# Patient Record
Sex: Female | Born: 1935 | Race: White | Hispanic: No | Smoking: Former smoker
Health system: Southern US, Community
[De-identification: ages and names within clinical notes are randomized; demographics above are authoritative.]

## PROBLEM LIST (undated history)

## (undated) DIAGNOSIS — R0989 Other specified symptoms and signs involving the circulatory and respiratory systems: Secondary | ICD-10-CM

## (undated) DIAGNOSIS — I219 Acute myocardial infarction, unspecified: Secondary | ICD-10-CM

## (undated) DIAGNOSIS — E079 Disorder of thyroid, unspecified: Secondary | ICD-10-CM

## (undated) DIAGNOSIS — Z8719 Personal history of other diseases of the digestive system: Secondary | ICD-10-CM

## (undated) DIAGNOSIS — I2510 Atherosclerotic heart disease of native coronary artery without angina pectoris: Secondary | ICD-10-CM

## (undated) DIAGNOSIS — R9431 Abnormal electrocardiogram [ECG] [EKG]: Secondary | ICD-10-CM

## (undated) DIAGNOSIS — J449 Chronic obstructive pulmonary disease, unspecified: Secondary | ICD-10-CM

## (undated) DIAGNOSIS — W19XXXA Unspecified fall, initial encounter: Secondary | ICD-10-CM

## (undated) DIAGNOSIS — F411 Generalized anxiety disorder: Secondary | ICD-10-CM

## (undated) DIAGNOSIS — R413 Other amnesia: Secondary | ICD-10-CM

## (undated) DIAGNOSIS — M79604 Pain in right leg: Secondary | ICD-10-CM

## (undated) DIAGNOSIS — F419 Anxiety disorder, unspecified: Secondary | ICD-10-CM

## (undated) DIAGNOSIS — D649 Anemia, unspecified: Secondary | ICD-10-CM

## (undated) DIAGNOSIS — I6529 Occlusion and stenosis of unspecified carotid artery: Secondary | ICD-10-CM

## (undated) DIAGNOSIS — J42 Unspecified chronic bronchitis: Secondary | ICD-10-CM

## (undated) DIAGNOSIS — M79605 Pain in left leg: Secondary | ICD-10-CM

## (undated) DIAGNOSIS — A0472 Enterocolitis due to Clostridium difficile, not specified as recurrent: Secondary | ICD-10-CM

## (undated) DIAGNOSIS — I10 Essential (primary) hypertension: Secondary | ICD-10-CM

## (undated) DIAGNOSIS — M1990 Unspecified osteoarthritis, unspecified site: Secondary | ICD-10-CM

## (undated) HISTORY — DX: Chronic obstructive pulmonary disease, unspecified: J44.9

## (undated) HISTORY — DX: Occlusion and stenosis of unspecified carotid artery: I65.29

## (undated) HISTORY — DX: Essential (primary) hypertension: I10

## (undated) HISTORY — DX: Personal history of other diseases of the digestive system: Z87.19

## (undated) HISTORY — DX: Atherosclerotic heart disease of native coronary artery without angina pectoris: I25.10

## (undated) HISTORY — DX: Other specified symptoms and signs involving the circulatory and respiratory systems: R09.89

## (undated) HISTORY — DX: Unspecified osteoarthritis, unspecified site: M19.90

## (undated) HISTORY — DX: Anxiety disorder, unspecified: F41.9

## (undated) HISTORY — DX: Anemia, unspecified: D64.9

## (undated) HISTORY — DX: Unspecified fall, initial encounter: W19.XXXA

## (undated) HISTORY — DX: Pain in left leg: M79.604

## (undated) HISTORY — DX: Disorder of thyroid, unspecified: E07.9

## (undated) HISTORY — DX: Acute myocardial infarction, unspecified: I21.9

## (undated) HISTORY — DX: Pain in left leg: M79.605

## (undated) HISTORY — DX: Generalized anxiety disorder: F41.1

## (undated) HISTORY — DX: Other amnesia: R41.3

## (undated) HISTORY — DX: Abnormal electrocardiogram (ECG) (EKG): R94.31

## (undated) HISTORY — DX: Enterocolitis due to Clostridium difficile, not specified as recurrent: A04.72

## (undated) HISTORY — DX: Unspecified chronic bronchitis: J42

---

## 1985-04-09 DIAGNOSIS — I219 Acute myocardial infarction, unspecified: Secondary | ICD-10-CM

## 1985-04-09 HISTORY — DX: Acute myocardial infarction, unspecified: I21.9

## 1997-07-28 ENCOUNTER — Other Ambulatory Visit: Admission: RE | Admit: 1997-07-28 | Discharge: 1997-07-28 | Payer: Self-pay | Admitting: Obstetrics and Gynecology

## 1997-09-08 ENCOUNTER — Inpatient Hospital Stay (HOSPITAL_COMMUNITY): Admission: AD | Admit: 1997-09-08 | Discharge: 1997-09-12 | Payer: Self-pay | Admitting: *Deleted

## 1997-10-15 ENCOUNTER — Encounter: Payer: Self-pay | Admitting: Internal Medicine

## 1997-10-15 DIAGNOSIS — K5730 Diverticulosis of large intestine without perforation or abscess without bleeding: Secondary | ICD-10-CM | POA: Insufficient documentation

## 1997-10-15 DIAGNOSIS — D126 Benign neoplasm of colon, unspecified: Secondary | ICD-10-CM

## 1999-02-28 ENCOUNTER — Other Ambulatory Visit: Admission: RE | Admit: 1999-02-28 | Discharge: 1999-02-28 | Payer: Self-pay | Admitting: Obstetrics and Gynecology

## 2001-01-15 ENCOUNTER — Encounter: Admission: RE | Admit: 2001-01-15 | Discharge: 2001-01-15 | Payer: Self-pay | Admitting: Family Medicine

## 2001-01-15 ENCOUNTER — Encounter: Payer: Self-pay | Admitting: Family Medicine

## 2002-04-10 ENCOUNTER — Emergency Department (HOSPITAL_COMMUNITY): Admission: EM | Admit: 2002-04-10 | Discharge: 2002-04-11 | Payer: Self-pay | Admitting: Emergency Medicine

## 2002-04-10 ENCOUNTER — Encounter: Payer: Self-pay | Admitting: Emergency Medicine

## 2002-04-11 ENCOUNTER — Encounter: Payer: Self-pay | Admitting: Emergency Medicine

## 2002-05-07 ENCOUNTER — Encounter: Payer: Self-pay | Admitting: Internal Medicine

## 2002-05-07 ENCOUNTER — Encounter: Admission: RE | Admit: 2002-05-07 | Discharge: 2002-05-07 | Payer: Self-pay | Admitting: Internal Medicine

## 2002-06-16 ENCOUNTER — Encounter: Payer: Self-pay | Admitting: Internal Medicine

## 2002-06-16 ENCOUNTER — Ambulatory Visit (HOSPITAL_COMMUNITY): Admission: RE | Admit: 2002-06-16 | Discharge: 2002-06-16 | Payer: Self-pay | Admitting: Internal Medicine

## 2003-02-02 ENCOUNTER — Encounter: Admission: RE | Admit: 2003-02-02 | Discharge: 2003-02-02 | Payer: Self-pay | Admitting: Internal Medicine

## 2003-06-25 ENCOUNTER — Ambulatory Visit (HOSPITAL_COMMUNITY): Admission: RE | Admit: 2003-06-25 | Discharge: 2003-06-25 | Payer: Self-pay | Admitting: Internal Medicine

## 2003-07-08 ENCOUNTER — Encounter: Admission: RE | Admit: 2003-07-08 | Discharge: 2003-07-08 | Payer: Self-pay | Admitting: Internal Medicine

## 2003-07-08 ENCOUNTER — Inpatient Hospital Stay (HOSPITAL_COMMUNITY): Admission: EM | Admit: 2003-07-08 | Discharge: 2003-07-13 | Payer: Self-pay | Admitting: Emergency Medicine

## 2003-07-09 ENCOUNTER — Encounter (INDEPENDENT_AMBULATORY_CARE_PROVIDER_SITE_OTHER): Payer: Self-pay | Admitting: *Deleted

## 2003-07-15 ENCOUNTER — Ambulatory Visit (HOSPITAL_COMMUNITY): Admission: RE | Admit: 2003-07-15 | Discharge: 2003-07-15 | Payer: Self-pay | Admitting: General Surgery

## 2003-07-16 ENCOUNTER — Emergency Department (HOSPITAL_COMMUNITY): Admission: EM | Admit: 2003-07-16 | Discharge: 2003-07-17 | Payer: Self-pay | Admitting: Emergency Medicine

## 2003-07-28 ENCOUNTER — Ambulatory Visit (HOSPITAL_COMMUNITY): Admission: RE | Admit: 2003-07-28 | Discharge: 2003-07-28 | Payer: Self-pay | Admitting: General Surgery

## 2003-08-11 ENCOUNTER — Ambulatory Visit (HOSPITAL_COMMUNITY): Admission: RE | Admit: 2003-08-11 | Discharge: 2003-08-11 | Payer: Self-pay | Admitting: General Surgery

## 2005-04-17 ENCOUNTER — Emergency Department (HOSPITAL_COMMUNITY): Admission: EM | Admit: 2005-04-17 | Discharge: 2005-04-17 | Payer: Self-pay | Admitting: Emergency Medicine

## 2006-12-25 ENCOUNTER — Ambulatory Visit: Payer: Self-pay | Admitting: Gastroenterology

## 2007-06-25 DIAGNOSIS — I209 Angina pectoris, unspecified: Secondary | ICD-10-CM

## 2007-06-25 DIAGNOSIS — E039 Hypothyroidism, unspecified: Secondary | ICD-10-CM

## 2007-06-25 DIAGNOSIS — I2510 Atherosclerotic heart disease of native coronary artery without angina pectoris: Secondary | ICD-10-CM

## 2007-06-25 DIAGNOSIS — K921 Melena: Secondary | ICD-10-CM | POA: Insufficient documentation

## 2007-06-25 DIAGNOSIS — D649 Anemia, unspecified: Secondary | ICD-10-CM | POA: Insufficient documentation

## 2007-06-25 DIAGNOSIS — N10 Acute tubulo-interstitial nephritis: Secondary | ICD-10-CM

## 2007-06-25 DIAGNOSIS — E7800 Pure hypercholesterolemia, unspecified: Secondary | ICD-10-CM

## 2007-06-25 DIAGNOSIS — I219 Acute myocardial infarction, unspecified: Secondary | ICD-10-CM | POA: Insufficient documentation

## 2007-06-25 DIAGNOSIS — K648 Other hemorrhoids: Secondary | ICD-10-CM | POA: Insufficient documentation

## 2007-06-25 DIAGNOSIS — K644 Residual hemorrhoidal skin tags: Secondary | ICD-10-CM | POA: Insufficient documentation

## 2008-01-15 ENCOUNTER — Inpatient Hospital Stay (HOSPITAL_COMMUNITY): Admission: RE | Admit: 2008-01-15 | Discharge: 2008-01-17 | Payer: Self-pay | Admitting: Internal Medicine

## 2008-01-15 ENCOUNTER — Encounter: Payer: Self-pay | Admitting: Emergency Medicine

## 2008-10-12 ENCOUNTER — Encounter
Admission: RE | Admit: 2008-10-12 | Discharge: 2008-10-12 | Payer: Self-pay | Admitting: Physical Medicine and Rehabilitation

## 2008-11-01 ENCOUNTER — Ambulatory Visit: Payer: Self-pay | Admitting: Diagnostic Radiology

## 2008-11-01 ENCOUNTER — Emergency Department (HOSPITAL_BASED_OUTPATIENT_CLINIC_OR_DEPARTMENT_OTHER): Admission: EM | Admit: 2008-11-01 | Discharge: 2008-11-01 | Payer: Self-pay | Admitting: Emergency Medicine

## 2008-11-10 ENCOUNTER — Encounter: Payer: Self-pay | Admitting: Pulmonary Disease

## 2008-11-10 ENCOUNTER — Inpatient Hospital Stay (HOSPITAL_COMMUNITY): Admission: EM | Admit: 2008-11-10 | Discharge: 2008-11-11 | Payer: Self-pay | Admitting: Emergency Medicine

## 2008-11-16 ENCOUNTER — Encounter: Payer: Self-pay | Admitting: Pulmonary Disease

## 2008-11-29 ENCOUNTER — Ambulatory Visit: Payer: Self-pay | Admitting: Pulmonary Disease

## 2008-11-29 DIAGNOSIS — R930 Abnormal findings on diagnostic imaging of skull and head, not elsewhere classified: Secondary | ICD-10-CM

## 2008-12-22 ENCOUNTER — Ambulatory Visit: Payer: Self-pay | Admitting: Vascular Surgery

## 2008-12-30 ENCOUNTER — Ambulatory Visit: Payer: Self-pay | Admitting: Vascular Surgery

## 2008-12-30 ENCOUNTER — Inpatient Hospital Stay (HOSPITAL_COMMUNITY): Admission: RE | Admit: 2008-12-30 | Discharge: 2009-01-01 | Payer: Self-pay | Admitting: Vascular Surgery

## 2008-12-30 ENCOUNTER — Encounter: Payer: Self-pay | Admitting: Vascular Surgery

## 2008-12-30 HISTORY — PX: CAROTID ENDARTERECTOMY: SUR193

## 2009-01-12 ENCOUNTER — Encounter: Admission: RE | Admit: 2009-01-12 | Discharge: 2009-01-12 | Payer: Self-pay | Admitting: Vascular Surgery

## 2009-01-12 ENCOUNTER — Ambulatory Visit: Payer: Self-pay | Admitting: Vascular Surgery

## 2009-01-19 ENCOUNTER — Ambulatory Visit: Payer: Self-pay | Admitting: Vascular Surgery

## 2009-04-27 ENCOUNTER — Encounter: Payer: Self-pay | Admitting: Pulmonary Disease

## 2009-04-28 ENCOUNTER — Inpatient Hospital Stay (HOSPITAL_COMMUNITY): Admission: EM | Admit: 2009-04-28 | Discharge: 2009-05-01 | Payer: Self-pay | Admitting: Internal Medicine

## 2009-04-28 ENCOUNTER — Encounter: Payer: Self-pay | Admitting: Emergency Medicine

## 2009-04-28 ENCOUNTER — Ambulatory Visit: Payer: Self-pay | Admitting: Diagnostic Radiology

## 2009-04-29 ENCOUNTER — Encounter (INDEPENDENT_AMBULATORY_CARE_PROVIDER_SITE_OTHER): Payer: Self-pay | Admitting: Internal Medicine

## 2009-04-29 ENCOUNTER — Ambulatory Visit: Payer: Self-pay | Admitting: Gastroenterology

## 2009-07-20 ENCOUNTER — Ambulatory Visit: Payer: Self-pay | Admitting: Vascular Surgery

## 2009-09-14 ENCOUNTER — Ambulatory Visit: Payer: Self-pay | Admitting: Hematology & Oncology

## 2009-09-28 LAB — CBC WITH DIFFERENTIAL (CANCER CENTER ONLY)
BASO#: 0 10*3/uL (ref 0.0–0.2)
EOS%: 6.1 % (ref 0.0–7.0)
Eosinophils Absolute: 0.7 10*3/uL — ABNORMAL HIGH (ref 0.0–0.5)
HCT: 29.1 % — ABNORMAL LOW (ref 34.8–46.6)
HGB: 9.4 g/dL — ABNORMAL LOW (ref 11.6–15.9)
LYMPH#: 2 10*3/uL (ref 0.9–3.3)
MCHC: 32.2 g/dL (ref 32.0–36.0)
NEUT#: 7.3 10*3/uL — ABNORMAL HIGH (ref 1.5–6.5)
NEUT%: 69 % (ref 39.6–80.0)
RBC: 4.01 10*6/uL (ref 3.70–5.32)

## 2009-09-30 LAB — IRON AND TIBC
%SAT: 6 % — ABNORMAL LOW (ref 20–55)
Iron: 21 ug/dL — ABNORMAL LOW (ref 42–145)
UIBC: 336 ug/dL

## 2009-09-30 LAB — RETICULOCYTES (CHCC)
ABS Retic: 32.6 10*3/uL (ref 19.0–186.0)
RBC.: 4.07 MIL/uL (ref 3.87–5.11)
Retic Ct Pct: 0.8 % (ref 0.4–3.1)

## 2009-09-30 LAB — FERRITIN: Ferritin: 12 ng/mL (ref 10–291)

## 2009-10-12 ENCOUNTER — Ambulatory Visit: Payer: Self-pay | Admitting: Diagnostic Radiology

## 2009-10-12 ENCOUNTER — Emergency Department (HOSPITAL_BASED_OUTPATIENT_CLINIC_OR_DEPARTMENT_OTHER): Admission: EM | Admit: 2009-10-12 | Discharge: 2009-10-12 | Payer: Self-pay | Admitting: Emergency Medicine

## 2009-10-20 ENCOUNTER — Ambulatory Visit: Payer: Self-pay | Admitting: Hematology & Oncology

## 2009-10-31 ENCOUNTER — Ambulatory Visit: Payer: Self-pay | Admitting: Diagnostic Radiology

## 2009-10-31 ENCOUNTER — Emergency Department (HOSPITAL_BASED_OUTPATIENT_CLINIC_OR_DEPARTMENT_OTHER): Admission: EM | Admit: 2009-10-31 | Discharge: 2009-10-31 | Payer: Self-pay | Admitting: Emergency Medicine

## 2010-02-03 ENCOUNTER — Telehealth: Payer: Self-pay | Admitting: Pulmonary Disease

## 2010-02-16 ENCOUNTER — Encounter: Admission: RE | Admit: 2010-02-16 | Discharge: 2010-02-16 | Payer: Self-pay | Admitting: Pulmonary Disease

## 2010-02-17 ENCOUNTER — Telehealth: Payer: Self-pay | Admitting: Pulmonary Disease

## 2010-02-22 ENCOUNTER — Ambulatory Visit: Payer: Self-pay | Admitting: Pulmonary Disease

## 2010-02-22 DIAGNOSIS — R918 Other nonspecific abnormal finding of lung field: Secondary | ICD-10-CM

## 2010-02-24 ENCOUNTER — Ambulatory Visit: Payer: Self-pay | Admitting: Vascular Surgery

## 2010-04-30 ENCOUNTER — Encounter: Payer: Self-pay | Admitting: Pulmonary Disease

## 2010-05-01 ENCOUNTER — Encounter: Payer: Self-pay | Admitting: Gastroenterology

## 2010-05-09 NOTE — Progress Notes (Signed)
Summary: speak to nurse re: CT  Phone Note Call from Patient Call back at Home Phone [phone]   Caller: Patient Call For: clance Complaint: Breathing Problems Summary of Call: pt says dr Royce Macadamia had requested that she (pt) call dr. clance's office to schedule a CT.    Initial call taken by: Tivis Ringer, CNA,  February 03, 2010 2:27 PM  Follow-up for Phone Call        Pt has not been seen since 11/2008; made appt for 02/23/2010 to see [facility] and advise CT scan then. Will forward to [facility] in case CT should be ordered prior. Reynaldo Minium CMA  February 03, 2010 3:50 PM   Additional Follow-up for Phone Call Additional follow up Details #1::        I ordered a ct chest in Jan to folllowup her abnormality.  did she not have this done? I do not have results for it, and never saw it. if she did not have this ct done in jan, need to have one before ov with me noncontrast scan to f/u abnormality Additional Follow-up by: Barbaraann Share MD,  February 03, 2010 5:16 PM    Additional Follow-up for Phone Call Additional follow up Details #2::    Pt aware that order per [facility] has been sent to [facility] and they will call with date and time.Reynaldo Minium CMA  February 03, 2010 5:20 PM

## 2010-05-09 NOTE — Assessment & Plan Note (Signed)
Summary: rov for abnormal ct chest   Visit Type:  Follow-up Copy to:  Charlesetta Shanks Primary Provider/Referring Provider:  Charlesetta Shanks  CC:  1 year f/u. Pt states she is here to discuss her ct in furthur detail. Pt states her breathing has been doing "fine". pt denies any cough. .  History of Present Illness: the pt comes in today for f/u of her known abnormal ct chest.  She has a GGO in the RUL that we have been following, and the most recent ct chest showed no change.  The pt denies any symptoms at this time, and has been eating well without unexplained wt loss.  She denies any cough, hemoptysis or chest pain.  Current Medications (verified): 1)  Isosorbide Mononitrate Cr 30 Mg Xr24h-Tab (Isosorbide Mononitrate) .... Take 1 Tablet By Mouth Once A Day 2)  Levothyroxine Sodium 88 Mcg Tabs (Levothyroxine Sodium) .... Take 1 Tablet By Mouth Once A Day 3)  Alprazolam 0.5 Mg Tabs (Alprazolam) .... Take By Mouth As Needed 4)  Duoneb 0.5-2.5 (3) Mg/6ml Soln (Ipratropium-Albuterol) .Marland Kitchen.. 1 Vial in Nebulizer Two Times A Day 5)  Citalopram Hydrobromide 20 Mg Tabs (Citalopram Hydrobromide) .... Take 1 Tablet By Mouth Once A Day 6)  Nitroglycerin 0.4 Mg Subl (Nitroglycerin) .... Use As Needed 7)  Atrovent Hfa 17 Mcg/act Aers (Ipratropium Bromide Hfa) .... 2 Puffs Four Times A Day X 30 Days 8)  Spiriva Handihaler 18 Mcg Caps (Tiotropium Bromide Monohydrate) .... Once Daily 9)  Oxycodone Hcl 10 Mg Tabs (Oxycodone Hcl) .Marland Kitchen.. 1 Two Times A Day 10)  Oxygen 3 Liters .... All Time  Allergies (verified): 1)  ! Penicillin 2)  ! Sulfa 3)  ! Aspirin  Review of Systems  The patient denies shortness of breath with activity, shortness of breath at rest, productive cough, non-productive cough, coughing up blood, chest pain, irregular heartbeats, acid heartburn, indigestion, loss of appetite, weight change, abdominal pain, difficulty swallowing, sore throat, tooth/dental problems, headaches, nasal  congestion/difficulty breathing through nose, sneezing, itching, ear ache, anxiety, depression, hand/feet swelling, joint stiffness or pain, rash, change in color of mucus, and fever.    Vital Signs:  Patient profile:   77 year old female Height:      64 inches Weight:      129 pounds BMI:     22.22 O2 Sat:      94 % on Room air Temp:     98.2 degrees F oral Pulse rate:   86 / minute BP sitting:   124 / 82  (left arm) Cuff size:   regular  Vitals Entered By: Carver Fila (February 22, 2010 2:17 PM)  O2 Flow:  Room air CC: 1 year f/u. Pt states she is here to discuss her ct in furthur detail. Pt states her breathing has been doing "fine". pt denies any cough.  Comments meds and allergies updated Phone number updated Carver Fila  February 22, 2010 2:18 PM    Physical Exam  General:  wd female in nad Lungs:  clear  Heart:  rrr Extremities:  no edema or cyanosis  Neurologic:  alert, oriented, moves all 4.   Impression & Recommendations:  Problem # 1:  PULMONARY NODULE (ICD-518.89)  the pt has a RUL GGO that we have been following, and has not changed since 2009.  It did appear slightly larger in 2010 compared to 2005.  I have discussed with her the difference btw "solid" spots and "fuzzy spots", and the need to follow GGO  more closely for a longer period of time.  Will check one more ct in a year, and if ok, no further.  Medications Added to Medication List This Visit: 1)  Atrovent Hfa 17 Mcg/act Aers (Ipratropium bromide hfa) .... 2 puffs four times a day x 30 days 2)  Spiriva Handihaler 18 Mcg Caps (Tiotropium bromide monohydrate) .... Once daily 3)  Oxycodone Hcl 10 Mg Tabs (Oxycodone hcl) .Marland Kitchen.. 1 two times a day 4)  Oxygen 3 Liters  .... All time  Other Orders: Est. Patient Level III ([address])  Patient Instructions: 1)  will do one more followup ct scan in one year as we discussed to look at your "fuzzy" lung spot.  Will call you with results.   Immunization  History:  Influenza Immunization History:    Influenza:  historical (01/07/2010)

## 2010-05-09 NOTE — Progress Notes (Signed)
Summary: results  Phone Note Call from Patient   Caller: Patient Call For: clance Summary of Call: calling about ct scan results Initial call taken by: Rickard Patience,  February 17, 2010 2:26 PM  Follow-up for Phone Call        looked like mindy called pt and pt was given results of her ct scan---pt stated that this has been bothering her all day since Dr. Royce Macadamia told pt that she has emphysema but the results she was given today said nothing about that .Marland Kitchen..please advise. pt is just wanting to know if she has emphysema. Randell Loop [facility]  February 17, 2010 3:01 PM   Additional Follow-up for Phone Call Additional follow up Details #1::        emphysema is not diagnosed by cxr or ct scan, it is diagnosed by breathing studies.  Her studies in the past have shown some emphysema. Additional Follow-up by: Barbaraann Share MD,  February 17, 2010 5:18 PM    Additional Follow-up for Phone Call Additional follow up Details #2::    called and spoke with pt and she is aware per [facility] that the cxr and ct scan do not diagnose with emphysema that it is done by the breathing tests.  she is aware that she has appt on 11-16 with [facility] and will discuss this further at that time. Randell Loop CMA  February 17, 2010 5:23 PM

## 2010-05-09 NOTE — Procedures (Signed)
Summary: Upper Endoscopy  Patient: [name] Note: All result statuses are Final unless otherwise noted.  Tests: (1) Upper Endoscopy (EGD)   EGD Upper Endoscopy       DONE     [facility]     [address]     [place], [place]  [address]           ENDOSCOPY PROCEDURE REPORT           PATIENT:  [name], [name]  MR#:  [id number]     BIRTHDATE:  February 15, 1931, 78 yrs. old  GENDER:  female           ENDOSCOPIST:  Barbette Hair. [name], MD     Referred by:           PROCEDURE DATE:  04/29/2009     PROCEDURE:  EGD with biopsy, EGD for control of bleeding     ASA CLASS:  Class II     INDICATIONS:  hematemesis           MEDICATIONS:   Fentanyl 50 mcg IV, Versed 4 mg IV, glycopyrrolate     (Robinal) 0.2 mg IV     TOPICAL ANESTHETIC:  Cetacaine Spray           DESCRIPTION OF PROCEDURE:   After the risks benefits and     alternatives of the procedure were thoroughly explained, informed     consent was obtained.  The EG-[id number] ([id number]) endoscope was     introduced through the mouth and advanced to the third portion of     the duodenum, without limitations.  The instrument was slowly     withdrawn as the mucosa was fully examined.     <<PROCEDUREIMAGES>>           An ulcer was found in the bulb of the duodenum. 1.5cm ulcer with     oozing (see image004). Ulcer was successfully cauterized with the     bipolar probe  An ulcer was found in the bulb of the duodenum. 5mm     ulcer at apex of bulb (see image005). Single red spot at base  An     ulcer was found in the antrum. 5mm ulcer with surrounding     erythema/edema. Bx taken (see image002).  Otherwise the     examination was normal.    Retroflexed views revealed no     abnormalities.    The scope was then withdrawn from the patient     and the procedure completed.           COMPLICATIONS:  None           ENDOSCOPIC IMPRESSION:     1) Bleeding Ulcer in the bulb of duodenum - s/p successful     cauterization with  bicap probe     2) Ulcer in the bulb of duodenum     3) Ulcer in the antrum     4) Otherwise normal examination     RECOMMENDATIONS:Protonix bid     Hold ASA/NSAIDS           REPEAT EXAM:  No           ______________________________     Barbette Hair. [name], MD           CC:  Charlesetta Shanks, Md           n.     eSIGNED:   Barbette Hair. Jaymin Waln at 04/29/2009 02:50 PM  [place], [place], [phone]  Note: An exclamation mark (!) indicates a result that was not dispersed into the flowsheet. Document Creation Date: 04/29/2009 2:51 PM _______________________________________________________________________  (1) Order result status: Final Collection or observation date-time: 04/29/2009 14:35 Requested date-time:  Receipt date-time:  Reported date-time:  Referring Physician:   Ordering Physician: Melvia Heaps [phone]) Specimen Source:  Source: Launa Grill Order Number: [id number] Lab site:

## 2010-05-09 NOTE — Miscellaneous (Signed)
Summary: Orders Update   Clinical Lists Changes  Orders: Added new Referral order of Radiology Referral (Radiology) - Signed 

## 2010-06-25 LAB — CBC
HCT: 25.7 % — ABNORMAL LOW (ref 36.0–46.0)
Hemoglobin: 7.2 g/dL — ABNORMAL LOW (ref 12.0–15.0)
Hemoglobin: 8.7 g/dL — ABNORMAL LOW (ref 12.0–15.0)
MCHC: 32.3 g/dL (ref 30.0–36.0)
MCHC: 36 g/dL (ref 30.0–36.0)
MCV: 90.6 fL (ref 78.0–100.0)
Platelets: 393 10*3/uL (ref 150–400)
Platelets: 160 10*3/uL (ref 150–400)
Platelets: 177 10*3/uL (ref 150–400)
Platelets: 284 10*3/uL (ref 150–400)
RBC: 2.17 MIL/uL — ABNORMAL LOW (ref 3.87–5.11)
RDW: 26.6 % — ABNORMAL HIGH (ref 11.5–15.5)
RDW: 13.4 % (ref 11.5–15.5)
RDW: 14.4 % (ref 11.5–15.5)
RDW: 14.9 % (ref 11.5–15.5)
RDW: 12.9 % (ref 11.5–15.5)
WBC: 5.6 10*3/uL (ref 4.0–10.5)
WBC: 10.6 10*3/uL — ABNORMAL HIGH (ref 4.0–10.5)
WBC: 14.3 10*3/uL — ABNORMAL HIGH (ref 4.0–10.5)
WBC: 7.2 10*3/uL (ref 4.0–10.5)
WBC: 9.6 10*3/uL (ref 4.0–10.5)

## 2010-06-25 LAB — POCT I-STAT 3, ART BLOOD GAS (G3+)
Bicarbonate: 25.3 mEq/L — ABNORMAL HIGH (ref 20.0–24.0)
O2 Saturation: 89 %
pCO2 arterial: 41.1 mmHg (ref 35.0–45.0)
pO2, Arterial: 56 mmHg — ABNORMAL LOW (ref 80.0–100.0)

## 2010-06-25 LAB — VITAMIN B12: Vitamin B-12: 234 pg/mL (ref 211–911)

## 2010-06-25 LAB — TYPE AND SCREEN
ABO/RH(D): O POS
Antibody Screen: POSITIVE
DAT, IgG: NEGATIVE
Donor AG Type: NEGATIVE
PT AG Type: NEGATIVE

## 2010-06-25 LAB — TRANSFERRIN: Transferrin: 233 mg/dL (ref 212–360)

## 2010-06-25 LAB — COMPREHENSIVE METABOLIC PANEL
ALT: 10 U/L (ref 0–35)
Albumin: 2.8 g/dL — ABNORMAL LOW (ref 3.5–5.2)
Alkaline Phosphatase: 59 U/L (ref 39–117)
Glucose, Bld: 130 mg/dL — ABNORMAL HIGH (ref 70–99)
Potassium: 4.1 mEq/L (ref 3.5–5.1)
Sodium: 141 mEq/L (ref 135–145)
Total Protein: 4.7 g/dL — ABNORMAL LOW (ref 6.0–8.3)

## 2010-06-25 LAB — DIFFERENTIAL
Basophils Absolute: 0 10*3/uL (ref 0.0–0.1)
Basophils Absolute: 0.1 10*3/uL (ref 0.0–0.1)
Eosinophils Absolute: 0.4 10*3/uL (ref 0.0–0.7)
Lymphocytes Relative: 17 % (ref 12–46)
Lymphocytes Relative: 30 % (ref 12–46)
Lymphs Abs: 2.9 10*3/uL (ref 0.7–4.0)
Monocytes Relative: 5 % (ref 3–12)
Neutro Abs: 3.9 10*3/uL (ref 1.7–7.7)
Neutro Abs: 5.9 10*3/uL (ref 1.7–7.7)
Neutrophils Relative %: 70 % (ref 43–77)

## 2010-06-25 LAB — BASIC METABOLIC PANEL
BUN: 14 mg/dL (ref 6–23)
BUN: 17 mg/dL (ref 6–23)
BUN: 57 mg/dL — ABNORMAL HIGH (ref 6–23)
Calcium: 8.9 mg/dL (ref 8.4–10.5)
Calcium: 8.5 mg/dL (ref 8.4–10.5)
Calcium: 9.4 mg/dL (ref 8.4–10.5)
Chloride: 114 mEq/L — ABNORMAL HIGH (ref 96–112)
Creatinine, Ser: 0.8 mg/dL (ref 0.4–1.2)
Creatinine, Ser: 0.59 mg/dL (ref 0.4–1.2)
GFR calc non Af Amer: >60 mL/min (ref >60)
GFR calc non Af Amer: >60 mL/min (ref >60)
GFR calc non Af Amer: >60 mL/min (ref >60)
Glucose, Bld: 130 mg/dL — ABNORMAL HIGH (ref 70–99)
Glucose, Bld: 101 mg/dL — ABNORMAL HIGH (ref 70–99)
Glucose, Bld: 107 mg/dL — ABNORMAL HIGH (ref 70–99)
Glucose, Bld: 186 mg/dL — ABNORMAL HIGH (ref 70–99)
Potassium: 3.9 mEq/L (ref 3.5–5.1)
Potassium: 3.1 mEq/L — ABNORMAL LOW (ref 3.5–5.1)
Potassium: 3.4 mEq/L — ABNORMAL LOW (ref 3.5–5.1)
Sodium: 144 mEq/L (ref 135–145)
Sodium: 144 mEq/L (ref 135–145)

## 2010-06-25 LAB — URINALYSIS, ROUTINE W REFLEX MICROSCOPIC
Bilirubin Urine: NEGATIVE
Glucose, UA: NEGATIVE mg/dL
Ketones, ur: 15 mg/dL — AB
Protein, ur: NEGATIVE mg/dL

## 2010-06-25 LAB — HEMOGLOBIN A1C: Hgb A1c MFr Bld: 5.2 % (ref 4.6–6.1)

## 2010-06-25 LAB — IRON AND TIBC
Iron: 21 ug/dL — ABNORMAL LOW (ref 42–135)
Iron: 242 ug/dL — ABNORMAL HIGH (ref 42–135)
Saturation Ratios: 8 % — ABNORMAL LOW (ref 20–55)
TIBC: 268 ug/dL (ref 250–470)
UIBC: <55 ug/dL

## 2010-06-25 LAB — CARDIAC PANEL(CRET KIN+CKTOT+MB+TROPI)
CK, MB: 1.1 ng/mL (ref 0.3–4.0)
CK, MB: 1.2 ng/mL (ref 0.3–4.0)
Total CK: 17 U/L (ref 7–177)
Total CK: 23 U/L (ref 7–177)
Troponin I: 0.03 ng/mL (ref 0.00–0.06)

## 2010-06-25 LAB — GLUCOSE, CAPILLARY
Glucose-Capillary: 101 mg/dL — ABNORMAL HIGH (ref 70–99)
Glucose-Capillary: 101 mg/dL — ABNORMAL HIGH (ref 70–99)
Glucose-Capillary: 104 mg/dL — ABNORMAL HIGH (ref 70–99)
Glucose-Capillary: 108 mg/dL — ABNORMAL HIGH (ref 70–99)
Glucose-Capillary: 90 mg/dL (ref 70–99)
Glucose-Capillary: 92 mg/dL (ref 70–99)

## 2010-06-25 LAB — POCT CARDIAC MARKERS
CKMB, poc: 1 ng/mL (ref 1.0–8.0)
Troponin i, poc: <0.05 ng/mL (ref 0.00–0.09)

## 2010-06-25 LAB — HEMOGLOBIN AND HEMATOCRIT, BLOOD
HCT: 19.1 % — ABNORMAL LOW (ref 36.0–46.0)
HCT: 25.8 % — ABNORMAL LOW (ref 36.0–46.0)
HCT: 26.2 % — ABNORMAL LOW (ref 36.0–46.0)
HCT: 26.5 % — ABNORMAL LOW (ref 36.0–46.0)
HCT: 27 % — ABNORMAL LOW (ref 36.0–46.0)
HCT: 27.2 % — ABNORMAL LOW (ref 36.0–46.0)
Hemoglobin: 9.2 g/dL — ABNORMAL LOW (ref 12.0–15.0)
Hemoglobin: 9.4 g/dL — ABNORMAL LOW (ref 12.0–15.0)
Hemoglobin: 9.5 g/dL — ABNORMAL LOW (ref 12.0–15.0)

## 2010-06-25 LAB — URINE MICROSCOPIC-ADD ON

## 2010-06-25 LAB — PROTIME-INR: Prothrombin Time: 14.9 seconds (ref 11.6–15.2)

## 2010-06-25 LAB — MAGNESIUM: Magnesium: 2.2 mg/dL (ref 1.5–2.5)

## 2010-06-25 LAB — CULTURE, BLOOD (ROUTINE X 2): Culture: NO GROWTH

## 2010-06-25 LAB — HEMOCCULT GUIAC POC 1CARD (OFFICE): Fecal Occult Bld: NEGATIVE

## 2010-07-14 LAB — COMPREHENSIVE METABOLIC PANEL
ALT: 13 U/L (ref 0–35)
Alkaline Phosphatase: 56 U/L (ref 39–117)
CO2: 27 mEq/L (ref 19–32)
Chloride: 102 mEq/L (ref 96–112)
Glucose, Bld: 107 mg/dL — ABNORMAL HIGH (ref 70–99)
Potassium: 4.5 mEq/L (ref 3.5–5.1)
Sodium: 137 mEq/L (ref 135–145)
Total Bilirubin: 0.5 mg/dL (ref 0.3–1.2)
Total Protein: 6.6 g/dL (ref 6.0–8.3)

## 2010-07-14 LAB — BASIC METABOLIC PANEL
Chloride: 108 mEq/L (ref 96–112)
GFR calc Af Amer: >60 mL/min (ref >60)
GFR calc non Af Amer: >60 mL/min (ref >60)
Potassium: 3.7 mEq/L (ref 3.5–5.1)
Sodium: 141 mEq/L (ref 135–145)

## 2010-07-14 LAB — URINALYSIS, ROUTINE W REFLEX MICROSCOPIC
Bilirubin Urine: NEGATIVE
Glucose, UA: NEGATIVE mg/dL
Hgb urine dipstick: NEGATIVE
Ketones, ur: NEGATIVE mg/dL
Protein, ur: NEGATIVE mg/dL
pH: 6 (ref 5.0–8.0)

## 2010-07-14 LAB — TYPE AND SCREEN

## 2010-07-14 LAB — PROTIME-INR
INR: 1 (ref 0.00–1.49)
Prothrombin Time: 12.7 seconds (ref 11.6–15.2)

## 2010-07-14 LAB — CBC
HCT: 24.9 % — ABNORMAL LOW (ref 36.0–46.0)
HCT: 33.8 % — ABNORMAL LOW (ref 36.0–46.0)
Hemoglobin: 11.4 g/dL — ABNORMAL LOW (ref 12.0–15.0)
Hemoglobin: 9.7 g/dL — ABNORMAL LOW (ref 12.0–15.0)
MCHC: 33.7 g/dL (ref 30.0–36.0)
MCV: 84.1 fL (ref 78.0–100.0)
Platelets: 299 10*3/uL (ref 150–400)
RBC: 2.96 MIL/uL — ABNORMAL LOW (ref 3.87–5.11)
RBC: 3.52 MIL/uL — ABNORMAL LOW (ref 3.87–5.11)
RBC: 3.98 MIL/uL (ref 3.87–5.11)
RDW: 15.5 % (ref 11.5–15.5)
WBC: 8.4 10*3/uL (ref 4.0–10.5)
WBC: 9.1 10*3/uL (ref 4.0–10.5)

## 2010-07-14 LAB — URINE MICROSCOPIC-ADD ON

## 2010-07-14 LAB — APTT: aPTT: 38 seconds — ABNORMAL HIGH (ref 24–37)

## 2010-07-15 LAB — DIFFERENTIAL
Basophils Absolute: 0 10*3/uL (ref 0.0–0.1)
Basophils Relative: 0 % (ref 0–1)
Eosinophils Absolute: 0 10*3/uL (ref 0.0–0.7)
Eosinophils Relative: 0 % (ref 0–5)
Eosinophils Relative: 3 % (ref 0–5)
Lymphocytes Relative: 23 % (ref 12–46)
Monocytes Absolute: 0.4 10*3/uL (ref 0.1–1.0)
Monocytes Absolute: 0.5 10*3/uL (ref 0.1–1.0)
Monocytes Relative: 4 % (ref 3–12)
Monocytes Relative: 5 % (ref 3–12)
Neutro Abs: 6.8 10*3/uL (ref 1.7–7.7)
Neutro Abs: 8 10*3/uL — ABNORMAL HIGH (ref 1.7–7.7)

## 2010-07-15 LAB — URINALYSIS, ROUTINE W REFLEX MICROSCOPIC
Glucose, UA: NEGATIVE mg/dL
Hgb urine dipstick: NEGATIVE
Ketones, ur: NEGATIVE mg/dL
Protein, ur: NEGATIVE mg/dL
pH: 7.5 (ref 5.0–8.0)

## 2010-07-15 LAB — COMPREHENSIVE METABOLIC PANEL
ALT: 12 U/L (ref 0–35)
AST: 20 U/L (ref 0–37)
Albumin: 3 g/dL — ABNORMAL LOW (ref 3.5–5.2)
Alkaline Phosphatase: 53 U/L (ref 39–117)
BUN: 10 mg/dL (ref 6–23)
Chloride: 107 mEq/L (ref 96–112)
GFR calc Af Amer: >60 mL/min (ref >60)
Potassium: 3.7 mEq/L (ref 3.5–5.1)
Sodium: 140 mEq/L (ref 135–145)
Total Bilirubin: 0.8 mg/dL (ref 0.3–1.2)
Total Protein: 5.6 g/dL — ABNORMAL LOW (ref 6.0–8.3)

## 2010-07-15 LAB — CBC
HCT: 22.6 % — ABNORMAL LOW (ref 36.0–46.0)
HCT: 24.9 % — ABNORMAL LOW (ref 36.0–46.0)
Hemoglobin: 7.6 g/dL — CL (ref 12.0–15.0)
Platelets: 354 10*3/uL (ref 150–400)
RBC: 2.51 MIL/uL — ABNORMAL LOW (ref 3.87–5.11)
RDW: 15.6 % — ABNORMAL HIGH (ref 11.5–15.5)
WBC: 10 10*3/uL (ref 4.0–10.5)

## 2010-07-15 LAB — POCT I-STAT, CHEM 8
Calcium, Ion: 1.11 mmol/L — ABNORMAL LOW (ref 1.12–1.32)
Creatinine, Ser: 0.6 mg/dL (ref 0.4–1.2)
Glucose, Bld: 105 mg/dL — ABNORMAL HIGH (ref 70–99)
Hemoglobin: 7.8 g/dL — CL (ref 12.0–15.0)
TCO2: 25 mmol/L (ref 0–100)

## 2010-07-15 LAB — CULTURE, BLOOD (ROUTINE X 2): Culture: NO GROWTH

## 2010-07-15 LAB — CROSSMATCH

## 2010-07-15 LAB — IRON AND TIBC
Saturation Ratios: 3 % — ABNORMAL LOW (ref 20–55)
TIBC: 368 ug/dL (ref 250–470)
UIBC: 372 ug/dL

## 2010-07-15 LAB — VITAMIN B12
Vitamin B-12: 695 pg/mL (ref 211–911)
Vitamin B-12: 894 pg/mL (ref 211–911)

## 2010-07-15 LAB — RETICULOCYTES
RBC.: 2.49 MIL/uL — ABNORMAL LOW (ref 3.87–5.11)
Retic Ct Pct: 2.9 % (ref 0.4–3.1)

## 2010-07-15 LAB — POCT CARDIAC MARKERS
CKMB, poc: 1.6 ng/mL (ref 1.0–8.0)
Troponin i, poc: <0.05 ng/mL (ref 0.00–0.09)

## 2010-07-15 LAB — FOLATE
Folate: 14.9 ng/mL
Folate: 19.4 ng/mL

## 2010-07-15 LAB — FERRITIN: Ferritin: 12 ng/mL (ref 10–291)

## 2010-07-15 LAB — ABO/RH: ABO/RH(D): O POS

## 2010-07-15 LAB — PROTIME-INR: Prothrombin Time: 13.9 seconds (ref 11.6–15.2)

## 2010-08-22 NOTE — H&P (Signed)
[name], [name]              ACCOUNT NO.:  [id number]   MEDICAL RECORD NO.:  [id number]          PATIENT TYPE:  INP   LOCATION:  1408                         FACILITY:  [facility]   PHYSICIAN:  Peggye Pitt, M.D. DATE OF BIRTH:  27-Feb-1931   DATE OF ADMISSION:  01/15/2008  DATE OF DISCHARGE:                              HISTORY & PHYSICAL   PRIMARY CARE PHYSICIAN:  Dr. Charlesetta Shanks.   CHIEF COMPLAINT:  Syncope.   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  EMS was called who took her to the new  medical center in [facility].  In the ED her blood pressure was noted to  be 80/60.  She was promptly given IV fluids to which her blood pressure  responded well.  Her urinalysis was dirty and we are called to admit  her.   ALLERGIES:  1. She has stated allergies to PENICILLIN.  2. SULFA DRUGS.   PAST MEDICAL HISTORY:  1. Significant for hypothyroidism.  2. Hypertension.  3. Possible prior history of MI in 1984.   HOME MEDICATIONS:  1. Isosorbide mononitrate 30 mg p.o. daily.  2. Synthroid 88 mcg once daily.  3. Multivitamins once daily.  4. Dicyclomine 10 mg one cap t.i.d.  5. Felodipine ER 2.5 mg p.o. daily.  6. Hydrochlorothiazide 25 mg p.o. daily.   SOCIAL HISTORY:  She denies any illicit drug use.  She is an occasional  social drinker, one or two beers per week.  She does smoke a pack of  cigarettes lasting about one week.  She lives by herself.   FAMILY HISTORY:  Noncontributory in this elderly lady.   REVIEW OF SYSTEMS:  Negative except as per HPI.   PHYSICAL EXAMINATION:  VITAL SIGNS:  Upon admission to [facility]:  Blood pressure 151/73, heart rate 83, respirations 20, O2 saturations  92% on room air with temperature 97.7.  GENERAL:  She is alert, oriented x3, no acute distress.  HEENT:  Normocephalic, atraumatic.  Her pupils  are reactive to light and  accommodation with intact extraocular movements.  NECK:  Supple with no JVD, no lymphadenopathy, no bruits or goiter.  LUNGS:  Clear to auscultation bilaterally.  HEART:  Regular rate and rhythm with no murmurs, rubs, or gallops  auscultated.  ABDOMEN:  Soft, nontender, nondistended, positive bowel sounds.  EXTREMITIES:  No edema.  Positive pedal pulses.  NEUROLOGIC:  Exam is grossly intact and nonfocal.   LABORATORY DATA:  Upon admission, sodium 142, potassium 3.8, chloride  102, bicarb 30, BUN 15, creatinine 0.8, glucose of 126.  All of her LFTs  are within normal limits.  Her lipase is 87.  BNP 64 and INR of 1.  Urinalysis shows a large amount of leukocyte esterase with 21-50 wbc's  and few bacteria.   She had a chest x-ray that showed COPD with no acute changes.  A CT of  her head showed no acute findings with atrophy and small vessel disease.  She also had a CT angiogram of the chest that showed no acute findings  with atrophy and small vessel disease.  Next she also had a CT angiogram  of the chest that showed no PE, no acute findings, a small left lower  lobe nodule.  Recommend follow-up CT in 6-12 months.   ASSESSMENT/PLAN:  1. Pyelonephritis/urosepsis.  Will check urine and blood cultures.      Will place on IV Cipro b.i.d. pending blood cultures given her      penicillin and sulfa allergies.  Will give IV fluids consisting of      normal saline at 75 ml an hour.  Consider transition to p.o.      antibiotics and prompt discharge home if she remains afebrile with      no white count.  2. Syncope likely secondary to #1.  For this reason we will not do a      usual syncopal workup at this time.  3. Hypertension.  Now with better blood pressure.  Has been normalized      with IV fluids and she is actually a bit hypertensive.  Will      restart all of her home medications with close follow-up of her      blood pressure.  4. As for her hypothyroidism  will check a TSH and continue on her home      dose of Synthroid.  5. For prophylaxis while in the hospital the patient will have      Protonix for GI prophylaxis and on subcutaneous Lovenox for DVT      prophylaxis.      Peggye Pitt, M.D.  Electronically Signed     EH/MEDQ  D:  01/15/2008  T:  01/16/2008  Job:  [id number]   cc:   Charlesetta Shanks

## 2010-08-22 NOTE — Letter (Signed)
December 25, 2006    [name]   RE:  [name], [name]  MRN:  [id number]  /  DOB:  09-Oct-1930   DearMs. Marney:   It is my pleasure to have treated you recently as a new patient in my  office.  I appreciate your confidence and the opportunity to participate  in your care.   Since I do have a busy inpatient endoscopy schedule and office schedule,  my office hours vary weekly.  I am, however, available for emergency  calls every day through my office.  If I cannot promptly meet an urgent  office appointment, another one of our gastroenterologists will be able  to assist you.   My well-trained staff are prepared to help you at all times.  For  emergencies after office hours, a physician from our gastroenterology  section is always available through my 24-hour answering service.   While you are under my care, I encourage discussion of your questions  and concerns, and I will be happy to return your calls as soon as I am  available.   Once again, I welcome you as a new patient and I look forward to a happy  and healthy relationship.    Sincerely,      Barbette Hair. [name], MD,FACG  Electronically Signed   RDK/MedQ  DD: 12/25/2006  DT: 12/25/2006  Job #: [id number]

## 2010-08-22 NOTE — Procedures (Signed)
CAROTID DUPLEX EXAM   INDICATION:  Follow up carotid artery disease.   HISTORY:  Diabetes:  No  Cardiac:  No  Hypertension:  No  Smoking:  Quit  Previous Surgery:  12/30/08 Right CEA  CV History:  No  Amaurosis Fugax  No, Paresthesias  No, Hemiparesis  No.                                       RIGHT             LEFT  Brachial systolic pressure:         165               170  Brachial Doppler waveforms:         WNL               WNL  Vertebral direction of flow:        Antegrade         Antegrade  DUPLEX VELOCITIES (cm/sec)  CCA peak systolic                   59                71  ECA peak systolic                   318               117  ICA peak systolic                   115               100  ICA end diastolic                   42                41  PLAQUE MORPHOLOGY:                  Heterogenous      Heterogenous  PLAQUE AMOUNT:                      Mild              Mild  PLAQUE LOCATION:                    ECA               ECA   IMPRESSION:  1. Bilateral internal carotid arteries suggest 20% to 39% stenosis.  2. Antegrade flow in bilateral vertebrals.  3. Right ECA suggests stenosis.          ___________________________________________  Janetta Hora Fields, MD   CB/MEDQ  D:  07/20/2009  T:  07/20/2009  Job:  [id number]

## 2010-08-22 NOTE — Discharge Summary (Signed)
[name], [name]              ACCOUNT NO.:  [id number]   MEDICAL RECORD NO.:  [id number]          PATIENT TYPE:  INP   LOCATION:  1408                         FACILITY:  [facility]   PHYSICIAN:  Hillery Aldo, M.D.   DATE OF BIRTH:  1931-01-03   DATE OF ADMISSION:  01/15/2008  DATE OF DISCHARGE:  01/17/2008                               DISCHARGE SUMMARY   PRIMARY CARE PHYSICIAN:  Dr. Charlesetta [name].   DISCHARGE DIAGNOSES:  1. Syncopal event.  2. Hypotension.  3. Pyuria/bacteriuria with cultures negative.  4. History of coronary artery disease.  5. History of hypertension.  6. Hypothyroidism.  7. Left lower lobe pulmonary nodule.  Followup CT scan in 6-12 months      to ensure stability recommended.  8. Hypokalemia.  9. Chronic obstructive pulmonary disease.   DISCHARGE MEDICATIONS:  1. Isosorbide mononitrate 30 mg daily.  2. Levoxyl 88 mEq daily.  3. Aspirin Back and Body 500 mg daily.  4. Multivitamin daily.  5. Dicyclomine 10 mg t.i.d.  6. Felodipine ER 2.5 mg daily.  7. Hydrochlorothiazide 25 mg daily.  8. Trazodone 50 mg nightly.  9. Citalopram 20 mg daily.  10.Alprazolam 0.5 mg daily p.r.n.  11.Potassium chloride 20 mEq daily.   CONSULTATION:  None.   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  Her blood pressure  was noted to be 80/60 on transport to the emergency department.  She was  promptly given IV fluids.  Her urinalysis revealed evidence of bacteria,  pyuria and it was felt that she was septic, and therefore was admitted  for further evaluation and workup.  For the full details, please see the  dictated report done by Dr. Ardyth Harps.   PROCEDURES AND DIAGNOSTIC STUDIES:  1. Chest x-ray on January 15, 2008 showed changes of COPD with no      interval change.  2. CT scan of the head on January 15, 2008 showed  atrophy and small      vessel disease with no acute intracranial abnormality.  3. CT angiogram of the chest on January 15, 2008 showed no CT findings      to suggest pulmonary emboli.  There was atherosclerotic changes      involving the thoracic or the aorta, but no dissection or aneurysm.      No acute pulmonary findings.  COPD changes.  Vague ground glass      opacity in the left apex, slightly increased in size since 2005.      Small nodule on the left lower lobe, followup noncontrast chest CT      in 6-12 months recommended.   DISCHARGE LABORATORY VALUES:  Urine cultures were negative.  Sodium was  142, potassium 3.3 (repleted), chloride 107, bicarb 27, BUN 7,  creatinine 0.67, glucose 111, white blood cell count was 7.4, hemoglobin  13.1, hematocrit 38.9, platelets 241.   HOSPITAL COURSE BY PROBLEM:  1. Syncope.  The patient's syncope was very  transient and likely      related to her hypotensive episode.  The source of her hypertension      is currently unclear, but may have been due to ongoing      antihypertensives in the setting of mild dehydration.  She did have      a slight elevation in her BUN to creatinine ratio on presentation,      and she responded nicely to IV fluid rehydration.  The patient had      no complaints of chest pain and her 12-lead EKG on presentation was      unremarkable.  Point of care cardiac markers obtained in the      emergency department were not elevated.  BNP was similarly not      elevated.  The patient was empirically put on Cipro for possible      urosepsis.  However, urine cultures failed to grow any bacteria.      At this point, the patient feels well, is  hemodynamically stable,      and has no evidence of ongoing hypotension.  She is not      orthostatic.  We will discharge her and have her call her primary      care [name] on Monday to arrange for a followup appointment.  She      should have a urinalysis and culture repeated at that  time.  2. Coronary artery disease:  The patient has not had any evidence of      acute flare.  A 12-lead EKG and point of care markers were      negative.  She has not complained of any chest pain.  3. Suspected urinary tract infection.  Again, the patient had      significant pyuria and bacteriuria.  However, cultures failed to      reveal any growth.  She has completed 3 days of therapy with Cipro      and we therefore will not discharge her on any further antibiotic      therapy, but would recommend followup urinalysis  with culture at      her primary care physician's office  4. Hypertension.  The patient's blood pressure did return to the      elevated range and needed resumption of her antihypertensives.  5. Hypothyroidism.  The patient is appropriately replaced.  Her TSH      was checked and found to be within the normal range at 1.692.  6. Pulmonary nodule.  The patient has an incidental pulmonary nodule      noted on CT scanning of the chest.  This should be followed up in 6      months' time to ensure stability.   DISPOSITION:  The patient is medically stable and will be discharged  home.  She should follow up with her primary care physician on Monday.      Hillery Aldo, M.D.  Electronically Signed     CR/MEDQ  D:  01/17/2008  T:  01/18/2008  Job:  [id number]   cc:   [name]

## 2010-08-22 NOTE — Procedures (Signed)
CAROTID DUPLEX EXAM   INDICATION:  Carotid disease.   HISTORY:  Diabetes:  No.  Cardiac:  No.  Hypertension:  No.  Smoking:  Previous.  Previous Surgery:  No.  CV History:  Asymptomatic.  Amaurosis Fugax No, Paresthesias No, Hemiparesis No                                       RIGHT             LEFT  Brachial systolic pressure:         132               128  Brachial Doppler waveforms:         Normal            Normal  Vertebral direction of flow:        Antegrade         Antegrade  DUPLEX VELOCITIES (cm/sec)  CCA peak systolic                   48                56  ECA peak systolic                   150               146  ICA peak systolic                   453               74  ICA end diastolic                   175               29  PLAQUE MORPHOLOGY:                  Mixed             Mixed  PLAQUE AMOUNT:                      Severe            Mild  PLAQUE LOCATION:                    ICA/ECA           ICA/ECA   IMPRESSION:  1. 80-99% stenosis of the right internal carotid artery.  2. 1-39% stenosis of the left internal carotid artery.    ___________________________________________  Janetta Hora Fields, MD   CH/MEDQ  D:  12/22/2008  T:  12/23/2008  Job:  [id number]

## 2010-08-22 NOTE — Discharge Summary (Signed)
[name], [name]              ACCOUNT NO.:  [id number]   MEDICAL RECORD NO.:  [id number]          PATIENT TYPE:  INP   LOCATION:  5528                         FACILITY:  [facility]   PHYSICIAN:  Peggye Pitt, M.D. DATE OF BIRTH:  June 10, 1930   DATE OF ADMISSION:  11/10/2008  DATE OF DISCHARGE:  11/11/2008                               DISCHARGE SUMMARY   DISCHARGE DIAGNOSES:  1. Left wrist fracture, status post repair.  2. Normocytic anemia.  3. Hypertension.  4. Hypothyroidism.  5. History of coronary artery disease.  6. Right upper lobe lung nodule.   DISCHARGE MEDICATIONS:  1. Oxycodone 5 mg every 6 hours as needed for pain, dispensed #30      tablets.  2. Xanax 0.5 mg twice daily as needed for agitation/anxiety.  3. Travatan eye drops were drops in each eye daily.  4. Imdur 30 mg daily.  5. Advair 250/50 mcg 1 puff twice daily.  6. Plendil 2.5 mg daily.  7. Synthroid 88 mcg daily.  8. Celexa 20 mg daily.   DISPOSITION AND FOLLOWUP:  [name] will be discharged home in stable  condition today.  She has been instructed by Dr. Luiz Blare to follow up at  his office in 1 week.  This appointment has already been scheduled.  I  have also asked her to follow up with Dr. Celene Skeen for both her anemia as  well as for her lung nodule.   CONSULTATION THIS HOSPITALIZATION:  Harvie Junior, MD, with  orthopedics.   IMAGES AND PROCEDURES THIS HOSPITALIZATION:  A CT scan of the chest  without contrast on November 10, 2008, that showed a persistent ground  glass opacification in the right upper lobe, minimally changed since  October 2009.  A period of slow growing neoplastic process such as a  bronchoalveolar cell carcinoma could not be excluded.  Stable linear  density in the left lung apex most consistent with scarring.   HISTORY AND PHYSICAL EXAM:  For full details, please refer to dictation  on November 10, 2008, by Dr. Allena Katz, but in brief [name] is a pleasant 77-  year-old Caucasian  lady who [facility] of the left wrist fracture which she injured after a fall in  November 01, 2008, when some routine labs showed that her hemoglobin was 7.6  and she was sent to the emergency department for further workup.  She  has not noted any melanotic stools.  She does have some arthritis and  has been taking heavy doses of aspirin for this.  She did state she had  a colonoscopy about 6 years ago and was told to go back in 10 years.   HOSPITAL COURSE:  1. Left wrist fracture:  This has been repaired by Orthopedics.      Follow up with him in the outpatient setting.  2. Anemia:  She was transfused 1 unit of PRBCs with an appropriate      hemoglobin response from 7.6-8.5.  An anemia panel has been drawn      that shows a folate of  14.9, vitamin B12 of 94, and a ferritin of      14.  Even though she is not clearly iron deficient, her ferritin is      somewhat decreased.  I have elected not to start her on ferrous      sulfate at this point.  Of note, her FOBT performed in the      emergency department was negative.  3. Hypertension, has been well controlled:  She was continued on her      home medication except for Cardizem.  We have continued her home      dose of Synthroid.  4. Rest of chronic medical issues have not been a problem this      hospitalization.   VITAL SIGNS ON DAY OF DISCHARGE:  Blood pressure 120/65, heart rate 82,  respirations 20, and O2 sats 93% on room air with a temp of 98.8.   LABORATORY DATA ON DAY OF DISCHARGE:  Sodium 140, potassium, 3.7,  chloride 107, bicarb 27, BUN 10, creatinine 0.68, and glucose of 124.  All of her LFTs are within normal limits with the exception of a  slightly decreased albumin of 3.0.  WBC count of 10.0, hemoglobin of 8.5  with an MCV of 89.6 and a platelet count of 354.      Peggye Pitt, M.D.  Electronically Signed     EH/MEDQ  D:  11/11/2008  T:  11/12/2008  Job:  [id number]    cc:   Lacretia Leigh. Amanda Pea, M.D.

## 2010-08-22 NOTE — Assessment & Plan Note (Signed)
OFFICE VISIT   [name], Kaitlin R  DOB:  [birth year]                                       01/12/2009  [id number]#:[id number]   The patient returns for followup today.  She underwent right carotid  endarterectomy on 12/30/2008.  Since that time she has complained of a  right posterior headache with occasional dizziness and some nausea.  She  also states that she has had some blurred vision on the right side and  if she looks at images on the television sometimes this manifests as  double vision.  She does not describe any classical events such as  amaurosis or hemiplegia or clumsiness of her upper extremities or lower  extremities.   PHYSICAL EXAM:  Blood pressure is 130/78 in the left arm, 157/73 in the  right arm, pulse is 92 and regular.  Neck incision is healing well.  There is no drainage or erythema.  She has no bruits bilaterally.  She  has 2+ radial pulses bilaterally.  Neurological exam she has no pronator  drift.  She has no facial droop.  Cranial nerves II-XII are intact.  Upper extremity and lower extremity motor strength is 5/5 and symmetric.  Testing of her eyes reveals no visual field cuts that are perceptible on  my exam.  Extraocular movements are intact.   The patient had a carotid duplex exam today which shows her carotid  endarterectomy site is widely patent.   I discussed with the patient that we would try to get a head CT on her  today to make sure that she had no evidence of stroke.  If she does  continue to have the blurred vision and headache and does not have  evidence of stroke on her head CT we will consider whether or not she  needs a neurologic evaluation.  She will follow up next week for further  consultation.   Janetta Hora. Fields, MD  Electronically Signed   CEF/MEDQ  D:  01/12/2009  T:  01/13/2009  Job:  [id number]

## 2010-08-22 NOTE — Letter (Signed)
December 25, 2006    Charlesetta Shanks, MD  [address], [address]  [place], [place] [address]   RE:  [name], [name]  MRN:  [id number]  /  DOB:  [birth year]   Dear Dr. Celene Skeen:   Upon your kind referral, I had the pleasure of evaluating your patient  and I am pleased to offer my findings.  I saw [name] in the  office today.  Enclosed is a copy of my progress note that details my  findings and recommendations.   Thank you for the opportunity to participate in your patient's care.    Sincerely,      Barbette Hair. [name], MD,FACG  Electronically Signed    RDK/MedQ  DD: 12/25/2006  DT: 12/25/2006  Job #: [id number]

## 2010-08-22 NOTE — Assessment & Plan Note (Signed)
[facility]                         GASTROENTEROLOGY OFFICE NOTE   Kaitlin, [name]                     MRN:          [id number]  DATE:12/25/2006                            DOB:          Apr 20, 1930    REFERRING PHYSICIAN:  Charlesetta Shanks   REASON FOR CONSULTATION:  Eratic bowels and abdominal pain.   REASON:  [name] is a 77 year old white female referred through the  courtesy of Dr. Celene Skeen for evaluation. For at least a year she has been  complaining of abdominal discomfort, consisting of bouts of severe  abdominal distension accompanied by diffuse lower abdominal pain. She  has frequent constipation characterized by small stools. This may occur  every other day. When she has her bouts of abdominal pain and distension  this may last for several days and is finally relieved by passing flatus  and stool. There is no history of melena or hematochezia. At her last  colonoscopy in 2004 (results are unknown) she has developed an abdominal  abscess requiring percutaneous drainage. She is unclear whether this  occurred immediately following the procedure or in proximity to the  procedure. She has known diverticulosis demonstrated by CT scan. Scan on  December 23, 2006 showed marked degenerative changes in the spine, non  obstructing renal calculi, and distal colonic diverticulosis. Since  starting dicyclomine her symptoms have improved.   PAST MEDICAL HISTORY:  Pertinent for an myocardial infarction in 1986.  She has rare angina. She has a history of hyperthyroidism. She is status  post herniorrhaphy, tubal ligation, and appendectomy.   FAMILY HISTORY:  Noncontributory.   MEDICATIONS:  Include; isosorbide, Levoxyl, aspirin, dicyclomine,  alprazolam, felodipine ER, and hydrochlorothiazide.   SHE IS ALLERGIC TO PENICILLIN AND SULFA.   She rarely smokes or drinks. She is widowed and retires.   REVIEW OF SYSTEMS:  Positive for low back pain,  sleeping problems, and  anxiety.   PHYSICAL EXAMINATION:  Pulse 104, blood pressure 126/60, weight 134. She  has exophthalmos.  ABDOMINAL EXAM: There is a very small reducible umbilical hernia.  HEENT:  EOMI.  PERRLA.  Sclerae are anicteric.  Conjunctivae are pink.  NECK:  Supple without thyromegaly, adenopathy or carotid bruits.  CHEST:  Clear to auscultation and percussion without adventitious  sounds.  CARDIAC:  Regular rhythm; normal S1 S2.  There are no murmurs, gallops  or rubs.  EXTREMITIES:  Full range of motion.  No cyanosis, clubbing or edema.  RECTAL:  Deferred.   IMPRESSION:  1. Intermittent abdominal distension with chronic constipation that is      worsened during these episodes of distension and pain. The symptoms      may be due to irritable bowel syndrome. Partial colonic obstruction      (possibly due to diverticulosis) is a consideration.  2. Coronary artery disease.  3. Exophthalmos.   RECOMMENDATION:  1. Barium enema. I have ordered this in leu of a colonoscopy because      of the possibility of a previous perforation related to      colonoscopy.  2. Continue dicyclomine but I instructed her to take it  as needed.     Barbette Hair. [name], MD,FACG  Electronically Signed    RDK/MedQ  DD: 12/25/2006  DT: 12/25/2006  Job #: [id number]   cc:   Charlesetta Shanks

## 2010-08-22 NOTE — Procedures (Signed)
CAROTID DUPLEX EXAM   INDICATION:  Carotid disease.   HISTORY:  Diabetes:  No.  Cardiac:  No.  Hypertension:  No.  Smoking:  Previous.  Previous Surgery:  Right carotid endarterectomy on 12/30/2008.  CV History:  Currently asymptomatic.  Amaurosis Fugax No, Paresthesias No, Hemiparesis No                                       RIGHT             LEFT  Brachial systolic pressure:         158               156  Brachial Doppler waveforms:         Normal            Normal  Vertebral direction of flow:        Antegrade         Antegrade  DUPLEX VELOCITIES (cm/sec)  CCA peak systolic                   57                63  ECA peak systolic                   99                104  ICA peak systolic                   65                80  ICA end diastolic                   14                22  PLAQUE MORPHOLOGY:                  Heterogeneous     Mixed  PLAQUE AMOUNT:                      Mild              Mild  PLAQUE LOCATION:                    ECA               ICA / ECA / CCA   IMPRESSION:  1. Patent right carotid endarterectomy site with no right internal      carotid artery stenosis.  2. No hemodynamically significant stenosis of left internal carotid      artery with plaque formations as described above.  3. No significant change noted when compared to the previous exam on      07/20/2009.   ___________________________________________  Janetta Hora. Fields, MD   CH/MEDQ  D:  02/24/2010  T:  02/24/2010  Job:  [id number]

## 2010-08-22 NOTE — Procedures (Signed)
CAROTID DUPLEX EXAM   INDICATION:  Followup right carotid endarterectomy.   HISTORY:  Diabetes:  No.  Cardiac:  No.  Hypertension:  No.  Smoking:  Quit.  Previous Surgery:  Yes, right side carotid endarterectomy.  CV History:  No.  Amaurosis Fugax No, Paresthesias No, Hemiparesis No                                       RIGHT             LEFT  Brachial systolic pressure:         157               130  Brachial Doppler waveforms:         WNL               WNL  Vertebral direction of flow:        Antegrade  DUPLEX VELOCITIES (cm/sec)  CCA peak systolic                   51  ECA peak systolic                   499  ICA peak systolic                   45  ICA end diastolic                   70  PLAQUE MORPHOLOGY:  PLAQUE AMOUNT:  PLAQUE LOCATION:   IMPRESSION:  1. Normal duplex of the right internal carotid artery.  2. Right side status post endarterectomy site is patent.  3. Elevated velocities in right external carotid artery suggesting      stenosis.         ___________________________________________  Janetta Hora Fields, MD   CB/MEDQ  D:  01/12/2009  T:  01/13/2009  Job:  [id number]

## 2010-08-22 NOTE — Assessment & Plan Note (Signed)
OFFICE VISIT   Hardgrave, Adalyne R  DOB:  07-18-1930                                       01/19/2009  EAVWU#:[id number]   The patient returns for followup today.  She was last seen on October 6.  She underwent right carotid endarterectomy on 12/30/2008.  Last week she  was complaining of headaches with some blurred vision primarily  involving the right eye.  She does not describe any symptoms of  amaurosis, hemiplegia or clumsiness of her upper or lower extremities.   On physical examination she is essentially unchanged from last week.  Head CT showed no evidence of acute infarct.  There is some atrophy.  She states that she has an appointment arranged to see her eye doctor,  Dr. Emily Filbert.  She is going to request that he send a copy of his report to  me.  If they can find no obvious connection in her eye for her symptoms  then we will consider a neurology evaluation and possible MRI of the  brain.  Otherwise the patient will follow up with me in six months' time  for repeat carotid duplex exam.   Janetta Hora. Fields, MD  Electronically Signed   CEF/MEDQ  D:  01/19/2009  T:  01/20/2009  Job:  [id number]

## 2010-08-22 NOTE — Assessment & Plan Note (Signed)
OFFICE VISIT   [name], [name]  DOB:  09/29/1930                                       12/22/2008  [id number]#:[id number]   The patient is a 77 year old female referred by Dr. Anne Fu for  evaluation of carotid stenosis.  She denies any symptoms of TIA,  amaurosis or stroke.  Her atherosclerotic risk factors include coronary  artery disease with myocardial infarction in 1986.  She also has a  history of hypertension.  She denies history of elevated cholesterol or  diabetes.   PAST MEDICAL HISTORY:  Also significant for chronic small pericardial  effusion and COPD.   PAST SURGICAL HISTORY:  She has had three previous C-sections,  appendectomy, percutaneous drain of a colon abscess and a left wrist  fracture in August of 2010.   MEDICATIONS:  1. DuoNeb 0.5/2.5 b.i.d.  2. Potassium chloride CR 10 mEq once a day.  3. Citalopram 20 mg once a day.  4. Isosorbide mononitrate CR 30 mg once a day.  5. Alprazolam 0.5 mg b.i.d.  6. Levothyroxine 88 mcg once daily.  7. felodipine 2.5 mg once a day.  8. Nitroglycerin 0.4 mg spray every 8 hours p.[name].n.  9. Albuterol inhaler 1 puff p.[name].n.   ALLERGIES:  She is allergic to penicillin which causes her lips to  become numb and sulfa which causes nausea.   FAMILY HISTORY:  Unremarkable.   SOCIAL HISTORY:  She is widowed, has three children, is a housewife, is  a former smoker but quit in 1986.  She does not consume alcohol  regularly.   REVIEW OF SYSTEMS:  She is 4 feet 2 inches, 138 pounds.  PULMONARY:  She has occasional wheezing.  ORTHOPEDIC:  She has multiple joint arthritis in her hands.  HEMATOLOGIC:  She has a history of anemia with a hemoglobin of 7 at the  time of her recent wrist fracture.  Pulmonary, neurologic, psychiatric and ENT review of systems are all  negative.   PHYSICAL EXAM:  Vital signs:  Blood pressure is 119/72 in the left arm,  120/66 in the right arm, pulse is 90 and regular.  HEENT:   Unremarkable.  Neck:  Has 2+ carotid pulses with a right-sided carotid bruit.  Chest:  Clear to auscultation.  Cardiac:  Is a regular rate and rhythm without  murmur.  Abdomen:  Soft, nontender, nondistended.  No masses.  Extremities:  She has 2+ brachial, radial and femoral pulses  bilaterally.  She has absent popliteal and pedal pulses bilaterally.  Neurological:  She has symmetric upper extremity and lower extremity  motor strength which is 5/5.   She had a carotid duplex exam performed at [facility] with [facility]  physicians and this showed a less than 40% left internal carotid artery  stenosis and a greater than 70% right internal carotid artery stenosis.  She had a repeat carotid duplex exam in our lab today which showed a  high-grade greater than 80% right internal carotid artery stenosis with  end-diastolic velocity greater than 175 cm/sec.  She had less than 40%  left internal carotid artery stenosis.  She had antegrade vertebral flow  bilaterally.   I discussed with the patient today that she would benefit from carotid  endarterectomy.  She had an echocardiogram performed on September 1  which showed a normal ejection fraction.   I explained  to her today the procedure details, risks, benefits and  possible complications including but not limited to bleeding, infection,  stroke risk of 1-2%, cranial nerve injury approximately 10%.  She  understands and agrees to proceed.  Carotid endarterectomy is scheduled  for 12/30/2008.  I have counseled her to continue to take one aspirin  daily including the day of her operation for stroke prophylaxis.   Janetta Hora. Fields, MD  Electronically Signed   CEF/MEDQ  D:  12/22/2008  T:  12/23/2008  Job:  [id number]   cc:   Jake Bathe, MD  Charlesetta Shanks

## 2010-08-22 NOTE — H&P (Signed)
[name], [name]              ACCOUNT NO.:  [id number]   MEDICAL RECORD NO.:  [id number]          PATIENT TYPE:  INP   LOCATION:  5528                         FACILITY:  [facility]   PHYSICIAN:  Joylene John, MD       DATE OF BIRTH:  May 17, 1930   DATE OF ADMISSION:  11/10/2008  DATE OF DISCHARGE:                              HISTORY & PHYSICAL   REASON FOR ADMISSION:  [facility] of [facility].   HISTORY OF PRESENT ILLNESS:  This is a 77 year old female with past  medical history of coronary artery disease, hypertension,  hypothyroidism, status post fall on July 26 with her left wrist  fracture.  The patient [facility] to the [facility] today to have the  fracture fixed, had some routine labs which showed that her hemoglobin  was 7.6.  Here the fracture was fixed under local anesthesia and the  patient was sent to the ER for further workup.  The patient tells me  that her PCP may have drawn some blood a few months ago to check her  thyroid levels, but she is not sure if they checked her blood count.  The patient denies having had any dark stools or blood in the stools.  No vaginal bleeding and not vomiting blood.  Upon asking the patient, I  am unable to identify any gross location for the blood loss.  She does  admit to feeling a bit dizzy, lightheadedness, and having some shortness  of breath in the last 3 to 4 months.  Denies any chest pain.  She does  tell me that she has been using 2 tablets of aspirin, not the Bayer, but  the arthritis and back strength aspirin 2 tablets every 4 hours for a  long time for her back pain.  She does tell me she has had a colonoscopy  about 6 years ago and was told to come back in 10 years.   PAST MEDICAL HISTORY:  Hypertension, coronary artery disease,  hypothyroidism, and colitis.   ALLERGIES:  SULFA and PENICILLIN.   FAMILY HISTORY:  Mother with lymphoma, father with kidney cancer.   SOCIAL HISTORY:  She lives by  herself, is independent with ADLs and  IADLs.  The patient was a smoker a long time ago.  No alcohol or drugs.   ROs: please refer to the hpi otherwise 14 pt ros -ve   PHYSICAL EXAMINATION:  VITAL SIGNS:  Temperature of 97.2; blood pressure  135/86, 152/77; pulse is ranging from 93 to 81; respirations 16 to 18;  O2 sat is 95 to 99% on room air.  GENERAL:  This is a pleasant Caucasian female in no acute distress.  HEENT:  No icterus noted.  Pallor present.  No JVD or bruit heard.  LUNGS:  Clear to auscultation.  CARDIOVASCULAR:  Regular rate and rhythm.  LOWER EXTREMITIES:  No edema appreciated.   LABORATORY DATA:  Labs show first set of cardiac enzymes as being  negative.  UA is negative.  Occult blood negative.  In the ER,  hemoglobin 7.6, of note it was  in the range of 13 last year, crit 22.6;  white count 9.8; platelets 407.  Sodium 140, potassium 3.4, chloride  106, bicarb 25, BUN 10, creatinine 0.6, glucose 105.   ASSESSMENT AND PLAN:  This is a pleasant Caucasian female coming in  after being found to have anemia on incidental labs.  Plan is to admit  the patient and to give her 1 unit of blood after type and cross.  We  will repeat the labs and the CBC in the morning.  We will do anemia  labs, which will include B12, iron, folate, ferritin, and TIBC before  giving her the blood transfusion.  If the CBC remains stable in the  morning which I suspect it will given patient's history, this seems to  be chronic loss most likely GI source given her chronic high dose  aspirin use and the patient will need further outpatient workup with  PCP.  We will continue her home medications.  We will hold her aspirin.  We will put her on oxycodone and Dilaudid for pain control.       Joylene John, MD  Electronically Signed     RP/MEDQ  D:  11/10/2008  T:  11/11/2008  Job:  [id number]

## 2010-08-25 NOTE — Discharge Summary (Signed)
NAME:  [name], [name]                        ACCOUNT NO.:  [id number]   MEDICAL RECORD NO.:  [id number]                   PATIENT TYPE:  INP   LOCATION:  5727                                 FACILITY:  [facility]   PHYSICIAN:  Ollen Gross. Vernell Morgans, M.D.              DATE OF BIRTH:  29-May-1930   DATE OF ADMISSION:  07/08/2003  DATE OF DISCHARGE:  07/13/2003                                 DISCHARGE SUMMARY   HOSPITAL COURSE:  Ms. Bayliss is a 76 year old white female with a history of  some sort of a colitis, who was admitted with lower abdominal pain and some  nausea.  She had a CT scan performed that showed a pelvic abscess.  She was  admitted for pain control and IV antibiotic therapy and arrangements were  made for placement of a percutaneous drain the following day.  GI was asked  to see her as well to evaluate her history of colitis and cardiology was  also asked to see her because of a pericardial effusion that was seen on her  scan.  The following day, a percutaneous drain was placed in radiology,  which she tolerated well.  The drain seemed to be draining some liquid  stool.  She ran some low-grade fevers, which resolved, but she tolerated her  percutaneous drain and by July 12, 2003, her diet was advanced and she was  tolerating it well, her drain output was gradually decreasing, she was  switched to oral Cipro and Flagyl and on July 13, 2003, she was allowed to  be discharged home with the drain in place.  Arrangements were made for  followup CT scan the following week.  Her condition at the time of discharge  was stable.   FINAL DIAGNOSIS:  Pelvic abscess possibly secondary to diverticulitis.   MEDICATIONS:  She was to resume her home medications.  She was given  prescriptions for Cipro and Flagyl.   FOLLOWUP:  Followup will be with Dr. Ollen Gross. Toth in 1 week.   DIET:  Diet is as tolerated.   CONDITION ON DISCHARGE:  Her condition is stable.   DISPOSITION:  She is discharged  to home.                                                Ollen Gross. Vernell Morgans, M.D.    PST/MEDQ  D:  08/04/2003  T:  08/05/2003  Job:  [id number]

## 2010-08-25 NOTE — Consult Note (Signed)
NAME:  VENNELA, [name]                        ACCOUNT NO.:  [id number]   MEDICAL RECORD NO.:  [id number]                   PATIENT TYPE:  INP   LOCATION:  5727                                 FACILITY:  [facility]   PHYSICIAN:  Meade Maw, M.D.                 DATE OF BIRTH:  02/05/1931   DATE OF CONSULTATION:  DATE OF DISCHARGE:                                   CONSULTATION   PRIMARY CARE PHYSICIAN:  Sharlet Salina, M.D.   INDICATION FOR CONSULT:  Pericardial effusion.   [name] is a very pleasant 77 year old female who was admitted on July 08, 2003, for left lower abdominal pain.  She was found to have a pelvic abscess  on CT scan.  During the CT scan, they noted a pericardial effusion.  Cardiology has subsequently been consulted.   [name] has had no shortness of breath, no chest pain. She has had  increased weakness and fatigue.  She has had lower abdominal tenderness,  left more than right.  She has had no presyncope or syncope.   PAST MEDICAL HISTORY:  1. Hypothyroidism.  2. Reported coronary artery disease status post myocardial infarction in     1986.  She has had a stress Cardiolite performed in the office on     May 18, 2002.  At this time, she was demonstrated to have normal     myocardial perfusion, ejection fraction of 81%.  At this time, she also     underwent a transthoracic echo and was found to have a very small     pericardial effusion, normal LV dimension, with preserved systolic     function, mild aortic sclerosis.  3. History of ulcerative colitis.  4. Arthritis.   OUTPATIENT MEDICATIONS:  1. Multivitamins.  2. Synthroid.  3. Aspirin.   ALLERGIES:  1. Sulfa.  2. Penicillin.   FAMILY HISTORY:  Noncontributory.   SOCIAL HISTORY:  She is married.  Minimal alcohol intake.  Smokes a half  pack per day.  Is retired from Air[employer].  Is anticipating a move to [place].   REVIEW OF SYSTEMS:  As noted above.   PHYSICAL EXAM:  GENERAL:  An elderly  female, somewhat stressed and fatigued.  VITAL SIGNS:  Blood pressure is 131/66.  Heart rate is 88.  Respiratory rate  is 18.  02 SAT is 92% on room air.  HEENT:  Unremarkable.  PULMONARY:  Breath sounds which are equal and clear to auscultation.  CARDIOVASCULAR:  A regular rate and rhythm.  Distinct heart tones.  ABDOMEN:  Tenderness in the lower abdominal region.  EXTREMITIES:  No peripheral edema.  SKIN:  Warm and dry.  NEURO:  Nonfocal.   LABORATORY DATA:  White count 10.7, hematocrit 32, platelet count 380,000.  Potassium is 3.3, glucose 132, creatinine is 0.8.  Normal liver enzymes.  INR is 1.2.  UA is normal.   IMPRESSION:  Small pericardial  effusion.  This is chronic.  It was first  noted in February, 2005.  It has not increased.  There is no evidence of  tamponade physiology.  No further cardiac workup is indicated.                                               Meade Maw, M.D.    HP/MEDQ  D:  07/09/2003  T:  07/10/2003  Job:  [id number]

## 2010-08-25 NOTE — H&P (Signed)
NAME:  [name], [name]                        ACCOUNT NO.:  [id number]   MEDICAL RECORD NO.:  [id number]                   PATIENT TYPE:  INP   LOCATION:  5727                                 FACILITY:  [facility]   PHYSICIAN:  Ollen Gross. Vernell Morgans, M.D.              DATE OF BIRTH:  1931-02-26   DATE OF ADMISSION:  07/08/2003  DATE OF DISCHARGE:                                HISTORY & PHYSICAL   [name] is a 77 year old white female who states that initially she had a  bout of abdominal pain about a year ago. She was evaluated and diagnosed  with ulcerative colitis, and she has been medicine for that for the past  year. She has done well until the last week at which time she has had some  worsening of her lower abdominal pain. She has had some mild nausea  associated with this but no vomiting, and she denies any fevers at home. She  has had only one to two small bowel movements in the last week, but her pain  has persisted. She went to her medical doctor who obtained a CT scan of her  abdomen and pelvis. Her CT scan was remarkable for a pelvis abscess that  seemed to be contained and not free as well as a pericardial effusion. She  otherwise has no other complaints other than lower abdominal pain.   PAST MEDICAL HISTORY:  1. Hyperthyroidism.  2. Coronary artery disease and MI in 1986.   PAST SURGICAL HISTORY:  1. Three C sections.  2. Emergency appendectomy about 8 years ago followed by an exploration for     abscess and then a subsequent hernia repair with mesh of her right lower     quadrant incision.   MEDICATIONS:  Her medications include Synthroid and aspirin as well as  something for her colitis.   ALLERGIES:  PENICILLIN and SULFA.   SOCIAL HISTORY:  She denies any current use of alcohol or tobacco problems.   FAMILY HISTORY:  Noncontributory.   PHYSICAL EXAMINATION:  VITAL SIGNS:  Temperature 99.2, blood pressure  115/63, pulse 100.  GENERAL:  She is a well-developed,  well-nourished, elderly, white female in  no acute distress.  SKIN:  Warm and dry with no jaundice.  HEENT:  Extraocular muscles are intact. Pupils are equal, round, and  reactive to light. Sclerae are nonicteric.  LUNGS:  Clear bilaterally with use of accessory respiratory muscles.  HEART:  Regular rate and rhythm with impulse in the left chest, 2/6 systolic  murmur heard on the right.  ABDOMEN:  Soft with some mid to moderate lower abdominal tenderness but no  signs of peritonitis. Tenderness is greater on the left than on the right.  No palpable mass or hepatosplenomegaly. She has a well healed lower midline  and right lower quadrant scars.  EXTREMITIES:  No clubbing, cyanosis, or edema.  PSYCHOLOGIC:  Alert and oriented x3 with  no evidence of any anxiety or  depression.   LABORATORY DATA:  On review of her lab work, her white count was 10,200. Her  CT scan again showed a pelvic abscess and pericardial effusion.   ASSESSMENT/PLAN:  This is a 77 year old white female with a pelvic abscess,  possibly secondary to either diverticulitis or another form of colitis as  well as a pericardial effusion. She does not show signs of sepsis or  peritonitis, and I think it would reasonable after reviewing the CT scan  with the radiologist to plan for percutaneous drainage of the abscess  tomorrow. We will admit her to the hospital and start her on IV antibiotics  tonight. If she continues to be stable and improved overall, then we will  plan to allow cardiology to evaluate her from the standpoint of pericardial  effusion as well as cardiac clearance standpoint should she need any further  surgery. The patient states that she has been told by her doctors in her  past that she should not have surgery, and we will confirm this with her  cardiologist. We will also ask gastroenterology to see her to evaluate her  for what she thinks is ulcerative colitis.                                                 Ollen Gross. Vernell Morgans, M.D.    PST/MEDQ  D:  07/08/2003  T:  07/09/2003  Job:  [id number]

## 2010-08-25 NOTE — Consult Note (Signed)
NAME:  [name], [name]                        ACCOUNT NO.:  [id number]   MEDICAL RECORD NO.:  [id number]                   PATIENT TYPE:  INP   LOCATION:  5727                                 FACILITY:  [facility]   PHYSICIAN:  Graylin Shiver, M.D.                DATE OF BIRTH:  [birth year]   DATE OF CONSULTATION:  07/09/2003  DATE OF DISCHARGE:                                   CONSULTATION   REASON FOR CONSULTATION:  Patient is a 77 year old female with a 1 weeks'  history of lower abdominal pain and CT was done of the abdomen and pelvis  which found abnormality of inflammation in the pelvic area.  This was felt  to be an abscess and drained today, per radiology. She currently has a  drainage tube in, draining purulent material.  GI was consulted, due to a  question of colitis in the past. It was unclear as to the etiology of this  pelvic abscess.   Per review of records from the office, it shows that Dr. Luther Parody did a  colonoscopy on this patient at [facility] on May 05, 2002 with  findings of an area of colitis at the area of the splenic flexure, felt to  be ischemic in nature. Biopsy did show inflammation.  Prior CT scans did not  reveal diverticulosis, and there was no mention on Dr. Joanette Gula  colonoscopy report of diverticulosis of the colon, per the information that  was relayed to me at this point in time.   A current CT however does show some diverticula.  There was no mention on  Dr. Joanette Gula prior colonoscopy report of ulcerative colitis.  In talking  to the patient, she recalls the term colitis, but no ulcerative colitis.   I asked her if she was following up with Dr. Luther Parody in the office for  colitis and she was not.   PAST MEDICAL HISTORY:  Hyperthyroidism, coronary artery disease, MI.   PAST SURGICAL HISTORY:  Cesarean section, appendectomy, hernia repair.   MEDICATIONS:  Synthroid, aspirin.  She states she was taking a medicine for  colitis but does  not know the name.  Also, she states that Dr. Luther Parody did  not prescribe this for her.   ALLERGIES:  PENICILLIN, SULFA.   SOCIAL HISTORY:  She does not smoke or drink alcohol.   PHYSICAL EXAMINATION:  GENERAL:  She does not appear in any acute distress  at this time.  She is nonicteric.  LUNGS:  Clear.  CARDIOVASCULAR:  Regular rhythm, no murmurs are heard.  ABDOMEN:  Bowel sounds are present. It is soft, there is some mild  discomfort to palpation, no rebound or guarding. There is a drain in the  left side of the abdomen, draining purulent material.   IMPRESSION:  Pelvic abscess.  I suspect that this is most likely secondary  to a perforated diverticulum and not due to colitis.  As mentioned above, in  reviewing the information, there was no diagnosis made of ulcerative  colitis.  It is conceivable that she developed an ischemic event to the  colon with subsequent perforation.   PLAN:  At the present time, I would recommend continuing with the  antibiotics as well as the drainage of the abscess.  I do not feel that  based on the information that we have at this time, that this patient had  ongoing inflammatory bowel disease.                                               Graylin Shiver, M.D.    Germain Osgood  D:  07/09/2003  T:  07/11/2003  Job:  [id number]   cc:   Ollen Gross. Vernell Morgans, M.D.  [address]  [place]  [place] [address]  Fax: [phone]   Sharlet Salina, M.D.  [address]  [place]  [place] [address]  Fax: [phone]   Althea Grimmer. Luther Parody, M.D.  [address]  [place]  [place] [address]  Fax: [phone]

## 2010-10-10 ENCOUNTER — Other Ambulatory Visit: Payer: Self-pay | Admitting: Pulmonary Disease

## 2010-10-10 DIAGNOSIS — R9389 Abnormal findings on diagnostic imaging of other specified body structures: Secondary | ICD-10-CM

## 2010-10-18 ENCOUNTER — Other Ambulatory Visit: Payer: Self-pay

## 2010-12-25 ENCOUNTER — Encounter: Payer: Self-pay | Admitting: Physician Assistant

## 2011-01-08 LAB — BASIC METABOLIC PANEL
BUN: 7
Calcium: 8.8
GFR calc non Af Amer: >60
Glucose, Bld: 111 — ABNORMAL HIGH
Sodium: 142

## 2011-01-08 LAB — CBC
Hemoglobin: 13.1
Platelets: 241
RDW: 14
WBC: 7.4

## 2011-01-08 LAB — URINE CULTURE
Colony Count: NO GROWTH
Special Requests: NEGATIVE

## 2011-01-08 LAB — CULTURE, BLOOD (ROUTINE X 2)
Culture: NO GROWTH
Culture: NO GROWTH

## 2011-01-08 LAB — TSH: TSH: 1.692

## 2011-01-09 LAB — URINE CULTURE

## 2011-01-09 LAB — COMPREHENSIVE METABOLIC PANEL
ALT: 12
Albumin: 4.7
Alkaline Phosphatase: 75
Chloride: 102
Potassium: 3.8
Sodium: 142
Total Bilirubin: 0.5
Total Protein: 7.8

## 2011-01-09 LAB — POCT CARDIAC MARKERS
CKMB, poc: 1.6
CKMB, poc: 1.9
Myoglobin, poc: 86.3
Troponin i, poc: <0.05

## 2011-01-09 LAB — URINALYSIS, ROUTINE W REFLEX MICROSCOPIC
Bilirubin Urine: NEGATIVE
Glucose, UA: NEGATIVE
Hgb urine dipstick: NEGATIVE
Ketones, ur: NEGATIVE
pH: 6.5

## 2011-01-09 LAB — URINE MICROSCOPIC-ADD ON

## 2011-02-15 ENCOUNTER — Ambulatory Visit (INDEPENDENT_AMBULATORY_CARE_PROVIDER_SITE_OTHER): Payer: Medicare Other | Admitting: Thoracic Diseases

## 2011-02-15 ENCOUNTER — Encounter: Payer: Self-pay | Admitting: Thoracic Diseases

## 2011-02-15 ENCOUNTER — Other Ambulatory Visit (INDEPENDENT_AMBULATORY_CARE_PROVIDER_SITE_OTHER): Payer: Medicare Other | Admitting: Vascular Surgery

## 2011-02-15 VITALS — BP 154/60 | HR 84 | Resp 18 | Ht 64.0 in | Wt 120.0 lb

## 2011-02-15 DIAGNOSIS — M48061 Spinal stenosis, lumbar region without neurogenic claudication: Secondary | ICD-10-CM | POA: Insufficient documentation

## 2011-02-15 DIAGNOSIS — I6529 Occlusion and stenosis of unspecified carotid artery: Secondary | ICD-10-CM

## 2011-02-15 DIAGNOSIS — Z48812 Encounter for surgical aftercare following surgery on the circulatory system: Secondary | ICD-10-CM

## 2011-02-15 NOTE — Progress Notes (Signed)
VASCULAR AND VEIN SURGERY CAROTID FOLLOW-UP  Date of Surgery: R CEA 12/30/08 Surgeon: Johny Sax, MD  HPI: [name] is a 77 y.o. female right handed who has known carotid disease. Patient is doing well.  She was having cardiac W/U when > 80% stenosis was found. Pt. has had surgical intervention of right CEA for asymptomatic critical Right carotid stenosis. Patient has Negative history of TIA or stroke symptom.  The patient denies amaurosis fugax or monocular blindness.  The patient  denies facial drooping.   Pt. denies headache Pt. denies hemiplegia.  The patient denies receptive or expressive aphasia.  Pt. denies weakness in BUE/BLE    Non-Invasive Vascular Imaging CAROTID DUPLEX 02/15/2011  Right ICA 20 - 39 % stenosis Left ICA 20 - 39 % stenosis  These findings are Unchanged from previous exam  Past Medical History  Diagnosis Date  . COPD (chronic obstructive pulmonary disease)   . CAD (coronary artery disease)   . Carotid artery occlusion   . Arthritis   . Anxiety   . Myocardial infarction 1987  . Anemia   Spinal Stenosis  Allergies  Allergen Reactions  . Aspirin   . Penicillins   . Sulfonamide Derivatives    Current Outpatient Prescriptions  Medication Sig Dispense Refill  . AMITIZA 24 MCG capsule       . citalopram (CELEXA) 20 MG tablet       . felodipine (PLENDIL) 2.5 MG 24 hr tablet       . levothyroxine (SYNTHROID, LEVOTHROID) 88 MCG tablet       . VOLTAREN 1 % GEL        ROS: + gait difficulties secondary to spinal stenosis otherwise as in HPI Physical Exam: Filed Vitals:   02/15/11 1459  BP: 154/60  Pulse: 84  Resp: 18  Height: 5\' 4"  (1.626 m)  Weight: 120 lb (54.432 kg)    Pt is A&O x 3 Gait is normal, pt walks with a cane Negative Bilateral carotid bruit/s Neuro Exam: Speech is fluent Negative weakness BUE/BLE Negative tongue deviation Negative facial droop  Plan: Follow-up in 1 years with Carotid Duplex scan and letter  Pt was given  information regarding stroke symptoms and prevention  Clinic MD: C. Fields. MD

## 2011-02-23 NOTE — Procedures (Unsigned)
CAROTID DUPLEX EXAM  INDICATION:  Follow up carotid stenosis.  HISTORY: Diabetes:  No. Cardiac:  No. Hypertension:  Yes. Smoking:  Previous. Previous Surgery:  Right carotid endarterectomy on 12/30/2008. CV History:  Episodes of blurry vision. Amaurosis Fugax No, Paresthesias No, Hemiparesis No.                                      RIGHT             LEFT Brachial systolic pressure:         144               154 Brachial Doppler waveforms:         WNL               WNL Vertebral direction of flow:        Antegrade         Antegrade DUPLEX VELOCITIES (cm/sec) CCA peak systolic                   76                81 ECA peak systolic                   123               165 ICA peak systolic                   89                99 ICA end diastolic                   8.0               30 PLAQUE MORPHOLOGY:                  Calcified         Calcified PLAQUE AMOUNT:                      Mild              Mild PLAQUE LOCATION:                    CCA/ICA           CCA/ICA  IMPRESSION: 1. Bilateral internal carotid artery stenosis in the 1% to 39% range     with a history of right carotid endarterectomy. 2. Left external carotid artery stenosis present. 3. Bilateral vertebral arteries patent and antegrade. 4. Essentially unchanged since the previous study on 02/24/2010.  ___________________________________________ Janetta Hora. Fields, MD  SH/MEDQ  D:  02/15/2011  T:  02/15/2011  Job:  [id number]

## 2012-02-14 ENCOUNTER — Other Ambulatory Visit: Payer: Medicare Other

## 2012-07-08 DIAGNOSIS — W19XXXA Unspecified fall, initial encounter: Secondary | ICD-10-CM

## 2012-07-08 HISTORY — DX: Unspecified fall, initial encounter: W19.XXXA

## 2012-07-14 ENCOUNTER — Encounter: Payer: Self-pay | Admitting: Vascular Surgery

## 2012-08-07 ENCOUNTER — Encounter: Payer: Medicare Other | Admitting: Vascular Surgery

## 2012-09-10 ENCOUNTER — Encounter: Payer: Self-pay | Admitting: Vascular Surgery

## 2012-09-11 ENCOUNTER — Other Ambulatory Visit: Payer: Self-pay

## 2012-09-11 ENCOUNTER — Ambulatory Visit (INDEPENDENT_AMBULATORY_CARE_PROVIDER_SITE_OTHER): Payer: Medicare Other | Admitting: Vascular Surgery

## 2012-09-11 ENCOUNTER — Telehealth: Payer: Self-pay | Admitting: Vascular Surgery

## 2012-09-11 ENCOUNTER — Encounter: Payer: Self-pay | Admitting: Vascular Surgery

## 2012-09-11 VITALS — BP 148/75 | HR 60 | Resp 16 | Ht 63.5 in | Wt 107.0 lb

## 2012-09-11 DIAGNOSIS — Z48812 Encounter for surgical aftercare following surgery on the circulatory system: Secondary | ICD-10-CM

## 2012-09-11 DIAGNOSIS — I739 Peripheral vascular disease, unspecified: Secondary | ICD-10-CM

## 2012-09-11 DIAGNOSIS — I70219 Atherosclerosis of native arteries of extremities with intermittent claudication, unspecified extremity: Secondary | ICD-10-CM

## 2012-09-11 DIAGNOSIS — M79609 Pain in unspecified limb: Secondary | ICD-10-CM

## 2012-09-11 DIAGNOSIS — I6529 Occlusion and stenosis of unspecified carotid artery: Secondary | ICD-10-CM

## 2012-09-11 NOTE — Progress Notes (Signed)
[facility]  HISTORY AND PHYSICAL   History of Present Illness:  Patient is a 79 y.o. year old female who presents for evaluation of right buttocks and calf pain. This is been present for approximately 3 years and slowly progressively worse. It is worse with ambulation. She denies rest pain. She had a prior right carotid endarterectomy several years ago.  Other medical problems include coronary artery disease anxiety and some memory loss. These are all currently stable.  Past Medical History  Diagnosis Date  . COPD (chronic obstructive pulmonary disease)   . CAD (coronary artery disease)   . Carotid artery occlusion   . Arthritis   . Anxiety     Afraid , living alone  . Myocardial infarction 1987  . Anemia   . Bilateral leg pain     for years  . Femoral bruit   . Bronchitis, chronic   . Fall April 2014    Pt. has fallen twife in the last 2 months.   . Personal history of other diseases of digestive system     Upper GI bleed  . Thyroid disease     Hypo-Thyroidism  . Generalized anxiety disorder   . Nonspecific abnormal electrocardiogram (ECG) (EKG)     Abnormal Results EKG  . Memory loss     Worsening  . Intestinal infection due to Clostridium difficile   . Colitis due to Clostridium difficile     Past Surgical History  Procedure Laterality Date  . Carotid endarterectomy  12/30/2008     Social History History  Substance Use Topics  . Smoking status: Former Smoker -- 15 years    Types: Cigarettes    Quit date: 04/09/1984  . Smokeless tobacco: Never Used  . Alcohol Use: No    Family History Family History  Problem Relation Age of Onset  . Cancer Mother   . Deep vein thrombosis Mother   . Cancer Father   . Kidney disease Father   . Heart disease Father     Allergies  Allergies  Allergen Reactions  . Aspirin     GI  Bleed  . Hydrocodeine (Dihydrocodeine) Nausea And Vomiting  . Penicillins     Numbness around mouth  . Biaxin  (Clarithromycin) Nausea Only  . Sulfonamide Derivatives Nausea And Vomiting     Current Outpatient Prescriptions  Medication Sig Dispense Refill  . ALPRAZolam (XANAX) 0.5 MG tablet Take 0.5 mg by mouth at bedtime as needed for sleep.      . AMITIZA 24 MCG capsule       . budesonide-formoterol (SYMBICORT) 160-4.5 MCG/ACT inhaler Inhale 2 puffs into the lungs 2 (two) times daily.      . citalopram (CELEXA) 20 MG tablet       . denosumab (PROLIA) 60 MG/ML SOLN injection Inject 60 mg into the skin every 6 (six) months. Administer in upper arm, thigh, or abdomen      . donepezil (ARICEPT) 10 MG tablet Take 10 mg by mouth at bedtime as needed.      . felodipine (PLENDIL) 2.5 MG 24 hr tablet       . Ipratropium Bromide (ATROVENT IN) Inhale 17 mcg into the lungs 2 (two) times daily.      . levothyroxine (SYNTHROID, LEVOTHROID) 88 MCG tablet       . nitroGLYCERIN (NITROSTAT) 0.4 MG SL tablet Place 0.4 mg under the tongue every 5 (five) minutes as needed for chest pain.      . oxyCODONE (  OXYCONTIN) 10 MG 12 hr tablet Take 10 mg by mouth every 12 (twelve) hours.      . pregabalin (LYRICA) 75 MG capsule Take 75 mg by mouth 2 (two) times daily.      . senna (SENOKOT) 8.6 MG tablet Take 1 tablet by mouth daily.      . VOLTAREN 1 % GEL        No current facility-administered medications for this visit.    ROS:   General:  No weight loss, Fever, chills  HEENT: No recent headaches, no nasal bleeding, no visual changes, no sore throat  Neurologic: No dizziness, blackouts, seizures. No recent symptoms of stroke or mini- stroke. No recent episodes of slurred speech, or temporary blindness.  Cardiac: No recent episodes of chest pain/pressure, no shortness of breath at rest.  No shortness of breath with exertion.  Denies history of atrial fibrillation or irregular heartbeat  Vascular: No history of rest pain in feet.  + history of claudication.  No history of non-healing ulcer, No history of DVT    Pulmonary: No home oxygen, no productive cough, no hemoptysis,  No asthma or wheezing  Musculoskeletal:  [ ] Arthritis, [ ] Low back pain,  [ ] Joint pain  Hematologic:No history of hypercoagulable state.  No history of easy bleeding.  No history of anemia  Gastrointestinal: No hematochezia or melena,  No gastroesophageal reflux, no trouble swallowing  Urinary: [ ] chronic Kidney disease, [ ] on HD - [ ] MWF or [ ] TTHS, [ ] Burning with urination, [ ] Frequent urination, [ ] Difficulty urinating;   Skin: No rashes  Psychological: + history of anxiety,  No history of depression   Physical Examination  Filed Vitals:   09/11/12 1102  BP: 148/75  Pulse: 60  Resp: 16  Height: 5' 3.5" (1.613 m)  Weight: 107 lb (48.535 kg)  SpO2: 94%    Body mass index is 18.65 kg/(m^2).  General:  Alert and oriented, no acute distress HEENT: Normal Neck: No bruit or JVD Pulmonary: Clear to auscultation bilaterally Cardiac: Regular Rate and Rhythm without murmur Abdomen: Soft, non-tender, non-distended, no mass Skin: No rash Extremity Pulses:  2+ radial, brachial, absent right femoral pulse 2+ left femoral, 1+ left dorsalis pedis,absent right dorsalis pedis absent posterior tibial pulses bilaterally Musculoskeletal: No deformity or edema  Neurologic: Upper and lower extremity motor 5/5 and symmetric  DATA: I reviewed a right lower extremity arterial noninvasive study dated 07/04/2012 from [facility] ABIs were 0.59 on the right 0.69 the left   ASSESSMENT: Evidence of bilateral peripheral arterial disease right leg symptomatic most likely iliac occlusive disease   PLAN:  Aortogram with bilateral lower extremity runoff 09/19/2012. She will also be scheduled for carotid duplex scan in the near future since we had not scanned her moderate carotid stenosis and her previous endarterectomy site for several years.  Iviona Hole, MD [facility] [place] Office:  [phone] Pager: [phone]  

## 2012-09-11 NOTE — Telephone Encounter (Signed)
Called patient, informed patient they had been scheduled for a carotid with duplex on 09/25/12 at 10 am and the would be receiving a reminder call closer to the date of the appointment.

## 2012-09-15 ENCOUNTER — Other Ambulatory Visit: Payer: Self-pay

## 2012-09-18 MED ORDER — SODIUM CHLORIDE 0.9 % IV SOLN
INTRAVENOUS | Status: DC
  Administered 2012-09-19: 09:00:00 via INTRAVENOUS

## 2012-09-19 ENCOUNTER — Encounter (HOSPITAL_COMMUNITY)
Admission: RE | Disposition: A | Discharge status: Home / Self Care | Payer: Self-pay | Source: Ambulatory Visit | Attending: Vascular Surgery

## 2012-09-19 ENCOUNTER — Ambulatory Visit (HOSPITAL_COMMUNITY)
Admission: RE | Admit: 2012-09-19 | Discharge: 2012-09-19 | Disposition: A | Discharge status: Home / Self Care | Payer: Medicare Other | Source: Ambulatory Visit | Attending: Vascular Surgery | Admitting: Vascular Surgery

## 2012-09-19 ENCOUNTER — Telehealth: Payer: Self-pay | Admitting: Vascular Surgery

## 2012-09-19 DIAGNOSIS — I70219 Atherosclerosis of native arteries of extremities with intermittent claudication, unspecified extremity: Secondary | ICD-10-CM

## 2012-09-19 DIAGNOSIS — J4489 Other specified chronic obstructive pulmonary disease: Secondary | ICD-10-CM | POA: Insufficient documentation

## 2012-09-19 DIAGNOSIS — I2510 Atherosclerotic heart disease of native coronary artery without angina pectoris: Secondary | ICD-10-CM | POA: Insufficient documentation

## 2012-09-19 DIAGNOSIS — J449 Chronic obstructive pulmonary disease, unspecified: Secondary | ICD-10-CM | POA: Insufficient documentation

## 2012-09-19 DIAGNOSIS — I701 Atherosclerosis of renal artery: Secondary | ICD-10-CM | POA: Insufficient documentation

## 2012-09-19 HISTORY — PX: ABDOMINAL AORTAGRAM: SHX5454

## 2012-09-19 HISTORY — PX: LOWER EXTREMITY ANGIOGRAM: SHX5508

## 2012-09-19 LAB — POCT I-STAT, CHEM 8
HCT: 37 % (ref 36.0–46.0)
Hemoglobin: 12.6 g/dL (ref 12.0–15.0)
Potassium: 4.1 mEq/L (ref 3.5–5.1)
Sodium: 141 mEq/L (ref 135–145)

## 2012-09-19 SURGERY — ABDOMINAL AORTAGRAM
Anesthesia: LOCAL

## 2012-09-19 MED ORDER — DOCUSATE SODIUM 100 MG PO CAPS: 100.0000 mg | ORAL_CAPSULE | Freq: Every day | ORAL | Status: DC

## 2012-09-19 MED ORDER — SODIUM CHLORIDE 0.45 % IV SOLN: INTRAVENOUS | Status: DC

## 2012-09-19 MED ORDER — FENTANYL CITRATE 0.05 MG/ML IJ SOLN
INTRAMUSCULAR | Status: AC
  Filled 2012-09-19: qty 2

## 2012-09-19 MED ORDER — ACETAMINOPHEN 325 MG PO TABS: 325.0000 mg | ORAL_TABLET | ORAL | Status: DC | PRN

## 2012-09-19 MED ORDER — GUAIFENESIN-DM 100-10 MG/5ML PO SYRP: 15.0000 mL | ORAL_SOLUTION | ORAL | Status: DC | PRN

## 2012-09-19 MED ORDER — LIDOCAINE HCL (PF) 1 % IJ SOLN
INTRAMUSCULAR | Status: AC
  Filled 2012-09-19: qty 30

## 2012-09-19 MED ORDER — MORPHINE SULFATE 10 MG/ML IJ SOLN: 2.0000 mg | INTRAMUSCULAR | Status: DC | PRN

## 2012-09-19 MED ORDER — PHENOL 1.4 % MT LIQD: 1.0000 | OROMUCOSAL | Status: DC | PRN

## 2012-09-19 MED ORDER — LABETALOL HCL 5 MG/ML IV SOLN: 10.0000 mg | INTRAVENOUS | Status: DC | PRN

## 2012-09-19 MED ORDER — ONDANSETRON HCL 4 MG/2ML IJ SOLN: 4.0000 mg | Freq: Four times a day (QID) | INTRAMUSCULAR | Status: DC | PRN

## 2012-09-19 MED ORDER — HYDRALAZINE HCL 20 MG/ML IJ SOLN: 10.0000 mg | INTRAMUSCULAR | Status: DC | PRN

## 2012-09-19 MED ORDER — MIDAZOLAM HCL 2 MG/2ML IJ SOLN
INTRAMUSCULAR | Status: AC
  Filled 2012-09-19: qty 2

## 2012-09-19 MED ORDER — METOPROLOL TARTRATE 1 MG/ML IV SOLN: 2.0000 mg | INTRAVENOUS | Status: DC | PRN

## 2012-09-19 MED ORDER — ACETAMINOPHEN 325 MG RE SUPP: 325.0000 mg | RECTAL | Status: DC | PRN

## 2012-09-19 MED ORDER — HEPARIN (PORCINE) IN NACL 2-0.9 UNIT/ML-% IJ SOLN
INTRAMUSCULAR | Status: AC
  Filled 2012-09-19: qty 1000

## 2012-09-19 NOTE — Telephone Encounter (Signed)
Sent appt letter

## 2012-09-19 NOTE — H&P (View-Only) (Signed)
[facility] [place] HISTORY AND PHYSICAL   History of Present Illness:  Patient is a 79 y.o. year old female who presents for evaluation of right buttocks and calf pain. This is been present for approximately 3 years and slowly progressively worse. It is worse with ambulation. She denies rest pain. She had a prior right carotid endarterectomy several years ago.  Other medical problems include coronary artery disease anxiety and some memory loss. These are all currently stable.  Past Medical History  Diagnosis Date  . COPD (chronic obstructive pulmonary disease)   . CAD (coronary artery disease)   . Carotid artery occlusion   . Arthritis   . Anxiety     Afraid , living alone  . Myocardial infarction 1987  . Anemia   . Bilateral leg pain     for years  . Femoral bruit   . Bronchitis, chronic   . Fall April 2014    Pt. has fallen twife in the last 2 months.   . Personal history of other diseases of digestive system     Upper GI bleed  . Thyroid disease     Hypo-Thyroidism  . Generalized anxiety disorder   . Nonspecific abnormal electrocardiogram (ECG) (EKG)     Abnormal Results EKG  . Memory loss     Worsening  . Intestinal infection due to Clostridium difficile   . Colitis due to Clostridium difficile     Past Surgical History  Procedure Laterality Date  . Carotid endarterectomy  12/30/2008     Social History History  Substance Use Topics  . Smoking status: Former Smoker -- 15 years    Types: Cigarettes    Quit date: 04/09/1984  . Smokeless tobacco: Never Used  . Alcohol Use: No    Family History Family History  Problem Relation Age of Onset  . Cancer Mother   . Deep vein thrombosis Mother   . Cancer Father   . Kidney disease Father   . Heart disease Father     Allergies  Allergies  Allergen Reactions  . Aspirin     GI  Bleed  . Hydrocodeine (Dihydrocodeine) Nausea And Vomiting  . Penicillins     Numbness around mouth  . Biaxin  (Clarithromycin) Nausea Only  . Sulfonamide Derivatives Nausea And Vomiting     Current Outpatient Prescriptions  Medication Sig Dispense Refill  . ALPRAZolam (XANAX) 0.5 MG tablet Take 0.5 mg by mouth at bedtime as needed for sleep.      . AMITIZA 24 MCG capsule       . budesonide-formoterol (SYMBICORT) 160-4.5 MCG/ACT inhaler Inhale 2 puffs into the lungs 2 (two) times daily.      . citalopram (CELEXA) 20 MG tablet       . denosumab (PROLIA) 60 MG/ML SOLN injection Inject 60 mg into the skin every 6 (six) months. Administer in upper arm, thigh, or abdomen      . donepezil (ARICEPT) 10 MG tablet Take 10 mg by mouth at bedtime as needed.      . felodipine (PLENDIL) 2.5 MG 24 hr tablet       . Ipratropium Bromide (ATROVENT IN) Inhale 17 mcg into the lungs 2 (two) times daily.      Marland Kitchen levothyroxine (SYNTHROID, LEVOTHROID) 88 MCG tablet       . nitroGLYCERIN (NITROSTAT) 0.4 MG SL tablet Place 0.4 mg under the tongue every 5 (five) minutes as needed for chest pain.      Marland Kitchen oxyCODONE (  OXYCONTIN) 10 MG 12 hr tablet Take 10 mg by mouth every 12 (twelve) hours.      . pregabalin (LYRICA) 75 MG capsule Take 75 mg by mouth 2 (two) times daily.      Marland Kitchen senna (SENOKOT) 8.6 MG tablet Take 1 tablet by mouth daily.      . VOLTAREN 1 % GEL        No current facility-administered medications for this visit.    ROS:   General:  No weight loss, Fever, chills  HEENT: No recent headaches, no nasal bleeding, no visual changes, no sore throat  Neurologic: No dizziness, blackouts, seizures. No recent symptoms of stroke or mini- stroke. No recent episodes of slurred speech, or temporary blindness.  Cardiac: No recent episodes of chest pain/pressure, no shortness of breath at rest.  No shortness of breath with exertion.  Denies history of atrial fibrillation or irregular heartbeat  Vascular: No history of rest pain in feet.  + history of claudication.  No history of non-healing ulcer, No history of DVT    Pulmonary: No home oxygen, no productive cough, no hemoptysis,  No asthma or wheezing  Musculoskeletal:  [ ]  Arthritis, [ ]  Low back pain,  [ ]  Joint pain  Hematologic:No history of hypercoagulable state.  No history of easy bleeding.  No history of anemia  Gastrointestinal: No hematochezia or melena,  No gastroesophageal reflux, no trouble swallowing  Urinary: [ ]  chronic Kidney disease, [ ]  on HD - [ ]  MWF or [ ]  TTHS, [ ]  Burning with urination, [ ]  Frequent urination, [ ]  Difficulty urinating;   Skin: No rashes  Psychological: + history of anxiety,  No history of depression   Physical Examination  Filed Vitals:   09/11/12 1102  BP: 148/75  Pulse: 60  Resp: 16  Height: 5' 3.5" (1.613 m)  Weight: 107 lb (48.535 kg)  SpO2: 94%    Body mass index is 18.65 kg/(m^2).  General:  Alert and oriented, no acute distress HEENT: Normal Neck: No bruit or JVD Pulmonary: Clear to auscultation bilaterally Cardiac: Regular Rate and Rhythm without murmur Abdomen: Soft, non-tender, non-distended, no mass Skin: No rash Extremity Pulses:  2+ radial, brachial, absent right femoral pulse 2+ left femoral, 1+ left dorsalis pedis,absent right dorsalis pedis absent posterior tibial pulses bilaterally Musculoskeletal: No deformity or edema  Neurologic: Upper and lower extremity motor 5/5 and symmetric  DATA: I reviewed a right lower extremity arterial noninvasive study dated 07/04/2012 from [facility] ABIs were 0.59 on the right 0.69 the left   ASSESSMENT: Evidence of bilateral peripheral arterial disease right leg symptomatic most likely iliac occlusive disease   PLAN:  Aortogram with bilateral lower extremity runoff 09/19/2012. She will also be scheduled for carotid duplex scan in the near future since we had not scanned her moderate carotid stenosis and her previous endarterectomy site for several years.  Fabienne Bruns, MD [facility] [place] Office:  [phone] Pager: [phone]

## 2012-09-19 NOTE — Interval H&P Note (Signed)
History and Physical Interval Note:  09/19/2012 9:43 AM  [name]  has presented today for surgery, with the diagnosis of PVD  The various methods of treatment have been discussed with the patient and family. After consideration of risks, benefits and other options for treatment, the patient has consented to  Procedure(s): ABDOMINAL AORTAGRAM (N/A) as a surgical intervention .  The patient's history has been reviewed, patient examined, no change in status, stable for surgery.  I have reviewed the patient's chart and labs.  Questions were answered to the patient's satisfaction.     FIELDS,CHARLES E

## 2012-09-19 NOTE — Telephone Encounter (Addendum)
Carotid prior CEA sched 09/15/12  6 m ABI, carotid & f/u sched 03/12/13  Left message on hm #  Sent appt letters  Message copied by Jena Gauss on Fri Sep 19, 2012  1:53 PM ------      Message from: Sherren Kerns      Created: Fri Sep 19, 2012 12:20 PM       Aortogram with bilat runoff, ultrasound            She needs a follow up carotid duplex next few weeks for surveillance prior CEA.            After that she can follow up with me in 6 months with bilat ABI and carotid duplex.            Fabienne Bruns, MD      [facility] [place]      Office: [phone]      Pager: [phone]       ------

## 2012-09-19 NOTE — Op Note (Signed)
Procedure: Aortogram with bilateral lower extremity runoff  Preoperative diagnosis: Claudication right leg  Postoperative diagnosis: Same  Anesthesia: Local with IV sedation  Operative findings: #1 left renal artery stenosis 70% #2 bilateral superficial femoral artery occlusions with reconstitution of the above-knee popliteal artery and three-vessel runoff #3 mild narrowing of right external iliac artery but no high-grade stenosis  Operative details: After obtaining informed consent, the patient was taken to the PV lab. The patient was placed in supine position on the Angio table. Both groins were prepped and draped in usual sterile fashion. Local anesthesia was infiltrated over the left common femoral artery. Ultrasound was used to identify the left common femoral artery. An introducer needle was used to cannulate the left common femoral artery. An 035 versacore wire was then threaded into the abdominal aorta under fluoroscopic guidance. A 5 French sheath was placed over the guidewire and the left common femoral artery. A 5 French pigtail catheter was placed over the guidewire into the abdominal aorta. An abdominal aortogram was obtained. Left and right renal arteries are patent. There is a 70% calcified stenosis of the left renal artery. The infrarenal abdominal aorta is patent with a mild area of narrowing just above the bifurcation approaching 30%. The right and left common iliac arteries are patent. The right and left internal iliac arteries are patent but diffusely diseased. The right external iliac artery is irregular but the tightest area of stenosis may only approached 30-40%. Of note the patient has a good quality right common femoral pulse. The left external iliac artery is patent. Oblique views of the pelvis were also obtained to confirm this. It was noted on the oblique view that the puncture in the left side was approximately 2 cm above the femoral head. Next the pigtail catheter was pulled  down just above the aortic bifurcation and lower extremity runoff views were performed. Bilaterally, the left common femoral and profunda femoris arteries are patent. The profunda femoris is large bilaterally. The left superficial femoral artery is occluded at its origin bilaterally. There is reconstitution of the above-knee popliteal artery via profunda collaterals bilaterally. The popliteal artery and all 3 tibial vessels are patent to the foot. The tibial vessels are small. At this point the pigtail catheter was removed over a guidewire. The 5 French sheath was removed and hemostasis obtained with direct pressure. The patient tolerated procedure well and there were no complications. The patient was taken to the holding area in stable condition.  Fabienne Bruns, MD [facility] [place] Office: [phone] Pager: [phone]

## 2012-09-25 ENCOUNTER — Other Ambulatory Visit: Payer: Medicare Other

## 2012-09-26 ENCOUNTER — Other Ambulatory Visit (INDEPENDENT_AMBULATORY_CARE_PROVIDER_SITE_OTHER): Payer: Medicare Other | Admitting: *Deleted

## 2012-09-26 DIAGNOSIS — Z48812 Encounter for surgical aftercare following surgery on the circulatory system: Secondary | ICD-10-CM

## 2012-09-26 DIAGNOSIS — I6529 Occlusion and stenosis of unspecified carotid artery: Secondary | ICD-10-CM

## 2012-09-30 ENCOUNTER — Other Ambulatory Visit: Payer: Self-pay

## 2012-09-30 DIAGNOSIS — Z48812 Encounter for surgical aftercare following surgery on the circulatory system: Secondary | ICD-10-CM

## 2012-10-01 ENCOUNTER — Encounter: Payer: Self-pay | Admitting: Surgery

## 2012-10-28 ENCOUNTER — Other Ambulatory Visit: Payer: Self-pay | Admitting: Pain Medicine

## 2012-10-28 DIAGNOSIS — M545 Low back pain: Secondary | ICD-10-CM

## 2012-11-07 ENCOUNTER — Other Ambulatory Visit: Payer: Medicare Other

## 2012-11-12 ENCOUNTER — Other Ambulatory Visit: Payer: Medicare Other

## 2012-11-16 ENCOUNTER — Ambulatory Visit
Admission: RE | Admit: 2012-11-16 | Discharge: 2012-11-16 | Disposition: A | Discharge status: Home / Self Care | Payer: Medicare Other | Source: Ambulatory Visit | Attending: Pain Medicine | Admitting: Pain Medicine

## 2012-11-16 DIAGNOSIS — M545 Low back pain: Secondary | ICD-10-CM

## 2012-11-25 ENCOUNTER — Other Ambulatory Visit: Payer: Self-pay | Admitting: Physician Assistant

## 2012-11-25 ENCOUNTER — Ambulatory Visit
Admission: RE | Admit: 2012-11-25 | Discharge: 2012-11-25 | Disposition: A | Discharge status: Home / Self Care | Payer: Medicare Other | Source: Ambulatory Visit | Attending: Physician Assistant | Admitting: Physician Assistant

## 2012-11-25 DIAGNOSIS — W19XXXA Unspecified fall, initial encounter: Secondary | ICD-10-CM

## 2013-02-17 ENCOUNTER — Other Ambulatory Visit: Payer: Self-pay | Admitting: Vascular Surgery

## 2013-02-17 DIAGNOSIS — I70209 Unspecified atherosclerosis of native arteries of extremities, unspecified extremity: Secondary | ICD-10-CM

## 2013-02-20 ENCOUNTER — Encounter: Payer: Self-pay | Admitting: Physical Medicine & Rehabilitation

## 2013-03-11 ENCOUNTER — Encounter: Payer: Self-pay | Admitting: Vascular Surgery

## 2013-03-12 ENCOUNTER — Inpatient Hospital Stay (HOSPITAL_COMMUNITY): Admission: RE | Admit: 2013-03-12 | Payer: Medicare Other | Source: Ambulatory Visit

## 2013-03-12 ENCOUNTER — Ambulatory Visit: Payer: Medicare Other | Admitting: Vascular Surgery

## 2013-03-12 ENCOUNTER — Other Ambulatory Visit (HOSPITAL_COMMUNITY): Payer: Medicare Other

## 2013-03-12 ENCOUNTER — Encounter (HOSPITAL_COMMUNITY): Payer: Medicare Other

## 2013-03-27 ENCOUNTER — Ambulatory Visit: Payer: Medicare Other | Admitting: Physical Medicine & Rehabilitation

## 2013-05-01 ENCOUNTER — Ambulatory Visit: Payer: Medicare Other | Admitting: Physical Medicine & Rehabilitation

## 2013-05-19 ENCOUNTER — Telehealth: Payer: Self-pay | Admitting: Vascular Surgery

## 2013-05-19 NOTE — Telephone Encounter (Signed)
Message copied by Gena Fray on Tue May 19, 2013 12:37 PM ------      Message from: Merleen Nicely      Created: Tue May 19, 2013  9:55 AM      Regarding: [place].       Fraser Din will be out of town she she wants [place] to cancel her appointment on 2/12. She will call us back to reschedule.            Thanks      Ebony Hail ------

## 2013-05-21 ENCOUNTER — Ambulatory Visit: Payer: Medicare Other | Admitting: Vascular Surgery

## 2013-05-21 ENCOUNTER — Other Ambulatory Visit (HOSPITAL_COMMUNITY): Payer: Medicare Other

## 2013-06-17 ENCOUNTER — Ambulatory Visit: Payer: Medicare Other | Attending: Family Medicine | Admitting: Physical Therapy

## 2013-06-17 DIAGNOSIS — IMO0001 Reserved for inherently not codable concepts without codable children: Secondary | ICD-10-CM | POA: Insufficient documentation

## 2013-06-20 ENCOUNTER — Encounter: Payer: Self-pay | Admitting: *Deleted

## 2013-06-23 ENCOUNTER — Ambulatory Visit: Payer: Medicare Other | Admitting: Physical Therapy

## 2013-06-25 ENCOUNTER — Ambulatory Visit: Payer: Medicare Other | Admitting: Physical Therapy

## 2013-09-14 ENCOUNTER — Emergency Department (HOSPITAL_COMMUNITY): Payer: Medicare Other

## 2013-09-14 ENCOUNTER — Inpatient Hospital Stay (HOSPITAL_COMMUNITY)
Admission: EM | Admit: 2013-09-14 | Discharge: 2013-09-21 | DRG: 917 | Disposition: A | Discharge status: Skilled Nursing Facility | Payer: Medicare Other | Attending: Internal Medicine | Admitting: Internal Medicine

## 2013-09-14 ENCOUNTER — Encounter (HOSPITAL_COMMUNITY): Payer: Self-pay | Admitting: Emergency Medicine

## 2013-09-14 DIAGNOSIS — R680 Hypothermia, not associated with low environmental temperature: Secondary | ICD-10-CM | POA: Diagnosis present

## 2013-09-14 DIAGNOSIS — G92 Toxic encephalopathy: Secondary | ICD-10-CM | POA: Diagnosis present

## 2013-09-14 DIAGNOSIS — E7800 Pure hypercholesterolemia, unspecified: Secondary | ICD-10-CM

## 2013-09-14 DIAGNOSIS — D649 Anemia, unspecified: Secondary | ICD-10-CM

## 2013-09-14 DIAGNOSIS — T424X1A Poisoning by benzodiazepines, accidental (unintentional), initial encounter: Secondary | ICD-10-CM | POA: Diagnosis present

## 2013-09-14 DIAGNOSIS — M1990 Unspecified osteoarthritis, unspecified site: Secondary | ICD-10-CM | POA: Diagnosis present

## 2013-09-14 DIAGNOSIS — E039 Hypothyroidism, unspecified: Secondary | ICD-10-CM | POA: Diagnosis present

## 2013-09-14 DIAGNOSIS — G8929 Other chronic pain: Secondary | ICD-10-CM | POA: Diagnosis present

## 2013-09-14 DIAGNOSIS — I10 Essential (primary) hypertension: Secondary | ICD-10-CM | POA: Diagnosis present

## 2013-09-14 DIAGNOSIS — M48061 Spinal stenosis, lumbar region without neurogenic claudication: Secondary | ICD-10-CM

## 2013-09-14 DIAGNOSIS — J449 Chronic obstructive pulmonary disease, unspecified: Secondary | ICD-10-CM | POA: Diagnosis present

## 2013-09-14 DIAGNOSIS — F0390 Unspecified dementia without behavioral disturbance: Secondary | ICD-10-CM | POA: Diagnosis present

## 2013-09-14 DIAGNOSIS — Z8249 Family history of ischemic heart disease and other diseases of the circulatory system: Secondary | ICD-10-CM

## 2013-09-14 DIAGNOSIS — I209 Angina pectoris, unspecified: Secondary | ICD-10-CM

## 2013-09-14 DIAGNOSIS — T50901A Poisoning by unspecified drugs, medicaments and biological substances, accidental (unintentional), initial encounter: Secondary | ICD-10-CM | POA: Diagnosis present

## 2013-09-14 DIAGNOSIS — Z87891 Personal history of nicotine dependence: Secondary | ICD-10-CM

## 2013-09-14 DIAGNOSIS — I6340 Cerebral infarction due to embolism of unspecified cerebral artery: Secondary | ICD-10-CM | POA: Diagnosis present

## 2013-09-14 DIAGNOSIS — I219 Acute myocardial infarction, unspecified: Secondary | ICD-10-CM

## 2013-09-14 DIAGNOSIS — J9620 Acute and chronic respiratory failure, unspecified whether with hypoxia or hypercapnia: Secondary | ICD-10-CM | POA: Diagnosis present

## 2013-09-14 DIAGNOSIS — I2510 Atherosclerotic heart disease of native coronary artery without angina pectoris: Secondary | ICD-10-CM | POA: Diagnosis present

## 2013-09-14 DIAGNOSIS — D126 Benign neoplasm of colon, unspecified: Secondary | ICD-10-CM

## 2013-09-14 DIAGNOSIS — K644 Residual hemorrhoidal skin tags: Secondary | ICD-10-CM

## 2013-09-14 DIAGNOSIS — I252 Old myocardial infarction: Secondary | ICD-10-CM

## 2013-09-14 DIAGNOSIS — K921 Melena: Secondary | ICD-10-CM

## 2013-09-14 DIAGNOSIS — R930 Abnormal findings on diagnostic imaging of skull and head, not elsewhere classified: Secondary | ICD-10-CM

## 2013-09-14 DIAGNOSIS — T4271XA Poisoning by unspecified antiepileptic and sedative-hypnotic drugs, accidental (unintentional), initial encounter: Secondary | ICD-10-CM | POA: Diagnosis present

## 2013-09-14 DIAGNOSIS — Z9981 Dependence on supplemental oxygen: Secondary | ICD-10-CM

## 2013-09-14 DIAGNOSIS — G929 Unspecified toxic encephalopathy: Secondary | ICD-10-CM | POA: Diagnosis present

## 2013-09-14 DIAGNOSIS — N10 Acute tubulo-interstitial nephritis: Secondary | ICD-10-CM

## 2013-09-14 DIAGNOSIS — K5730 Diverticulosis of large intestine without perforation or abscess without bleeding: Secondary | ICD-10-CM

## 2013-09-14 DIAGNOSIS — J4489 Other specified chronic obstructive pulmonary disease: Secondary | ICD-10-CM | POA: Diagnosis present

## 2013-09-14 DIAGNOSIS — T43591A Poisoning by other antipsychotics and neuroleptics, accidental (unintentional), initial encounter: Secondary | ICD-10-CM | POA: Diagnosis present

## 2013-09-14 DIAGNOSIS — I639 Cerebral infarction, unspecified: Secondary | ICD-10-CM

## 2013-09-14 DIAGNOSIS — F1910 Other psychoactive substance abuse, uncomplicated: Secondary | ICD-10-CM | POA: Diagnosis present

## 2013-09-14 DIAGNOSIS — T68XXXA Hypothermia, initial encounter: Secondary | ICD-10-CM | POA: Diagnosis present

## 2013-09-14 DIAGNOSIS — J984 Other disorders of lung: Secondary | ICD-10-CM

## 2013-09-14 DIAGNOSIS — T424X4A Poisoning by benzodiazepines, undetermined, initial encounter: Principal | ICD-10-CM | POA: Diagnosis present

## 2013-09-14 DIAGNOSIS — T400X1A Poisoning by opium, accidental (unintentional), initial encounter: Secondary | ICD-10-CM | POA: Diagnosis present

## 2013-09-14 DIAGNOSIS — N390 Urinary tract infection, site not specified: Secondary | ICD-10-CM | POA: Diagnosis present

## 2013-09-14 DIAGNOSIS — K648 Other hemorrhoids: Secondary | ICD-10-CM

## 2013-09-14 DIAGNOSIS — Z66 Do not resuscitate: Secondary | ICD-10-CM | POA: Diagnosis present

## 2013-09-14 LAB — I-STAT CG4 LACTIC ACID, ED: LACTIC ACID, VENOUS: 2.54 mmol/L — AB (ref 0.5–2.2)

## 2013-09-14 LAB — COMPREHENSIVE METABOLIC PANEL
ALBUMIN: 3.9 g/dL (ref 3.5–5.2)
ALT: 21 U/L (ref 0–35)
AST: 40 U/L — AB (ref 0–37)
Alkaline Phosphatase: 79 U/L (ref 39–117)
BUN: 25 mg/dL — AB (ref 6–23)
CO2: 28 mEq/L (ref 19–32)
CREATININE: 1.26 mg/dL — AB (ref 0.50–1.10)
Calcium: 9.4 mg/dL (ref 8.4–10.5)
Chloride: 96 mEq/L (ref 96–112)
GFR calc Af Amer: 45 mL/min — ABNORMAL LOW (ref >90)
GFR calc non Af Amer: 39 mL/min — ABNORMAL LOW (ref >90)
Glucose, Bld: 141 mg/dL — ABNORMAL HIGH (ref 70–99)
Potassium: 3.8 mEq/L (ref 3.7–5.3)
Sodium: 140 mEq/L (ref 137–147)
TOTAL PROTEIN: 7.3 g/dL (ref 6.0–8.3)
Total Bilirubin: 0.4 mg/dL (ref 0.3–1.2)

## 2013-09-14 LAB — I-STAT VENOUS BLOOD GAS, ED
Acid-base deficit: 1 mmol/L (ref 0.0–2.0)
BICARBONATE: 27.9 meq/L — AB (ref 20.0–24.0)
O2 Saturation: 66 %
TCO2: 30 mmol/L (ref 0–100)
pCO2, Ven: 63.3 mmHg — ABNORMAL HIGH (ref 45.0–50.0)
pH, Ven: 7.252 (ref 7.250–7.300)
pO2, Ven: 41 mmHg (ref 30.0–45.0)

## 2013-09-14 LAB — CBC WITH DIFFERENTIAL/PLATELET
Basophils Absolute: 0 10*3/uL (ref 0.0–0.1)
Basophils Relative: 0 % (ref 0–1)
Eosinophils Absolute: 0 10*3/uL (ref 0.0–0.7)
Eosinophils Relative: 0 % (ref 0–5)
HCT: 42.8 % (ref 36.0–46.0)
Hemoglobin: 14.3 g/dL (ref 12.0–15.0)
LYMPHS ABS: 0.9 10*3/uL (ref 0.7–4.0)
Lymphocytes Relative: 6 % — ABNORMAL LOW (ref 12–46)
MCH: 31 pg (ref 26.0–34.0)
MCHC: 33.4 g/dL (ref 30.0–36.0)
MCV: 92.8 fL (ref 78.0–100.0)
Monocytes Absolute: 0.8 10*3/uL (ref 0.1–1.0)
Monocytes Relative: 5 % (ref 3–12)
Neutro Abs: 12.6 10*3/uL — ABNORMAL HIGH (ref 1.7–7.7)
Neutrophils Relative %: 89 % — ABNORMAL HIGH (ref 43–77)
PLATELETS: 292 10*3/uL (ref 150–400)
RBC: 4.61 MIL/uL (ref 3.87–5.11)
RDW: 13.4 % (ref 11.5–15.5)
WBC: 14.2 10*3/uL — ABNORMAL HIGH (ref 4.0–10.5)

## 2013-09-14 LAB — URINE MICROSCOPIC-ADD ON

## 2013-09-14 LAB — URINALYSIS, ROUTINE W REFLEX MICROSCOPIC
Bilirubin Urine: NEGATIVE
GLUCOSE, UA: NEGATIVE mg/dL
Ketones, ur: NEGATIVE mg/dL
LEUKOCYTES UA: NEGATIVE
Nitrite: NEGATIVE
PH: 5 (ref 5.0–8.0)
Protein, ur: 30 mg/dL — AB
SPECIFIC GRAVITY, URINE: 1.019 (ref 1.005–1.030)
Urobilinogen, UA: 0.2 mg/dL (ref 0.0–1.0)

## 2013-09-14 LAB — TSH: TSH: 6.6 u[IU]/mL — AB (ref 0.350–4.500)

## 2013-09-14 LAB — CK: Total CK: 2880 U/L — ABNORMAL HIGH (ref 7–177)

## 2013-09-14 MED ORDER — NITROGLYCERIN 0.4 MG SL SUBL: 0.4000 mg | SUBLINGUAL_TABLET | SUBLINGUAL | Status: DC | PRN

## 2013-09-14 MED ORDER — SODIUM CHLORIDE 0.9 % IV BOLUS (SEPSIS)
1000.0000 mL | Freq: Once | INTRAVENOUS | Status: AC
  Administered 2013-09-14: 1000 mL via INTRAVENOUS

## 2013-09-14 MED ORDER — NALOXONE HCL 0.4 MG/ML IJ SOLN: 0.4000 mg | INTRAMUSCULAR | Status: DC | PRN

## 2013-09-14 MED ORDER — PREGABALIN 75 MG PO CAPS: 75.0000 mg | ORAL_CAPSULE | Freq: Two times a day (BID) | ORAL | Status: DC

## 2013-09-14 MED ORDER — LEVOTHYROXINE SODIUM 88 MCG PO TABS
88.0000 ug | ORAL_TABLET | Freq: Every day | ORAL | Status: DC
  Administered 2013-09-15 – 2013-09-20 (×6): 88 ug via ORAL
  Filled 2013-09-14 (×8): qty 1

## 2013-09-14 MED ORDER — FELODIPINE ER 5 MG PO TB24
5.0000 mg | ORAL_TABLET | Freq: Every day | ORAL | Status: DC
  Administered 2013-09-16 – 2013-09-21 (×6): 5 mg via ORAL
  Filled 2013-09-14 (×8): qty 1

## 2013-09-14 MED ORDER — LUBIPROSTONE 24 MCG PO CAPS
24.0000 ug | ORAL_CAPSULE | Freq: Every day | ORAL | Status: DC
  Filled 2013-09-14: qty 1

## 2013-09-14 MED ORDER — CITALOPRAM HYDROBROMIDE 20 MG PO TABS
20.0000 mg | ORAL_TABLET | Freq: Every day | ORAL | Status: DC
  Filled 2013-09-14: qty 1

## 2013-09-14 MED ORDER — BUDESONIDE-FORMOTEROL FUMARATE 160-4.5 MCG/ACT IN AERO
2.0000 | INHALATION_SPRAY | Freq: Two times a day (BID) | RESPIRATORY_TRACT | Status: DC
  Administered 2013-09-15 – 2013-09-21 (×12): 2 via RESPIRATORY_TRACT
  Filled 2013-09-14 (×2): qty 6

## 2013-09-14 MED ORDER — ALPRAZOLAM 0.5 MG PO TABS: 0.5000 mg | ORAL_TABLET | Freq: Two times a day (BID) | ORAL | Status: DC | PRN

## 2013-09-14 MED ORDER — DONEPEZIL HCL 10 MG PO TABS
10.0000 mg | ORAL_TABLET | Freq: Every day | ORAL | Status: DC
  Administered 2013-09-15 – 2013-09-20 (×6): 10 mg via ORAL
  Filled 2013-09-14 (×9): qty 1

## 2013-09-14 MED ORDER — HEPARIN SODIUM (PORCINE) 5000 UNIT/ML IJ SOLN
5000.0000 [IU] | Freq: Three times a day (TID) | INTRAMUSCULAR | Status: DC
  Administered 2013-09-15 – 2013-09-21 (×20): 5000 [IU] via SUBCUTANEOUS
  Filled 2013-09-14 (×23): qty 1

## 2013-09-14 NOTE — ED Notes (Signed)
Per EMS, pt was found by her grandson unresponsive.  Grandson told EMS, pt has a history of taking to much pain medication.  Pt's pupils were noted to be pinpoint and 2mg  of narcan was given and pt responded by moving arms some and moaning.  Pt given third MG without additonal response.  Pt lives at home alone and is independent

## 2013-09-14 NOTE — ED Provider Notes (Signed)
CSN: [id number]     Arrival date & time 09/14/13  2004 History   First MD Initiated Contact with Patient 09/14/13 2017     Chief Complaint  Patient presents with  . Altered Mental Status     (Consider location/radiation/quality/duration/timing/severity/associated sxs/prior Treatment) Patient is a 80 y.o. female presenting with altered mental status.  Altered Mental Status Presenting symptoms: unresponsiveness   Severity:  Mild Most recent episode:  Today Episode history:  Single Timing:  Unable to specify Progression:  Improving Chronicity:  Recurrent Context: drug use and not taking medications as prescribed   Context: not alcohol use, not a recent change in medication, not a recent illness and not a recent infection     Past Medical History  Diagnosis Date  . COPD (chronic obstructive pulmonary disease)   . Carotid artery occlusion   . Arthritis   . Anxiety     Afraid , living alone  . Myocardial infarction 1987  . Anemia   . Bilateral leg pain     for years  . Femoral bruit   . Bronchitis, chronic   . Fall April 2014    Pt. has fallen twife in the last 2 months.   . Personal history of other diseases of digestive system     Upper GI bleed  . Generalized anxiety disorder   . Nonspecific abnormal electrocardiogram (ECG) (EKG)     Abnormal Results EKG  . Memory loss     Worsening  . Intestinal infection due to Clostridium difficile   . Colitis due to Clostridium difficile   . Hypertension   . CAD (coronary artery disease)   . Thyroid disease     Hypo-Thyroidism   Past Surgical History  Procedure Laterality Date  . Carotid endarterectomy  12/30/2008   Family History  Problem Relation Age of Onset  . Cancer Mother   . Deep vein thrombosis Mother   . Cancer Father   . Kidney disease Father   . Heart disease Father    History  Substance Use Topics  . Smoking status: Former Smoker -- 15 years    Types: Cigarettes    Quit date: 04/09/1984  . Smokeless  tobacco: Never Used  . Alcohol Use: No   OB History   Grav Para Term Preterm Abortions TAB SAB Ect Mult Living                 Review of Systems  Unable to perform ROS: Mental status change      Allergies  Aspirin; Penicillins; Biaxin; and Sulfonamide derivatives  Home Medications   Prior to Admission medications   Medication Sig Start Date End Date Taking? Authorizing Provider  ALPRAZolam Duanne Moron) 0.5 MG tablet Take 0.5 mg by mouth 2 (two) times daily as needed for sleep or anxiety.     Historical Provider, MD  AMITIZA 24 MCG capsule Take 24 mcg by mouth daily.  02/14/11   Historical Provider, MD  budesonide-formoterol (SYMBICORT) 160-4.5 MCG/ACT inhaler Inhale 2 puffs into the lungs 2 (two) times daily.    Historical Provider, MD  citalopram (CELEXA) 20 MG tablet Take 20 mg by mouth daily.  01/27/11   Historical Provider, MD  donepezil (ARICEPT) 10 MG tablet Take 10 mg by mouth at bedtime.     Historical Provider, MD  felodipine (PLENDIL) 5 MG 24 hr tablet Take 5 mg by mouth daily.    Historical Provider, MD  levothyroxine (SYNTHROID, LEVOTHROID) 88 MCG tablet Take 88 mcg by mouth daily before  breakfast.  02/04/11   Historical Provider, MD  nitroGLYCERIN (NITROSTAT) 0.4 MG SL tablet Place 0.4 mg under the tongue every 5 (five) minutes as needed for chest pain.    Historical Provider, MD  oxyCODONE (OXYCONTIN) 10 MG 12 hr tablet Take 10 mg by mouth every 12 (twelve) hours.    Historical Provider, MD  pregabalin (LYRICA) 75 MG capsule Take 75 mg by mouth 2 (two) times daily.    Historical Provider, MD   BP 151/79  Pulse 81  Temp(Src) 92.5 F (33.6 C) (Rectal)  Resp 18  SpO2 93% Physical Exam  Nursing note and vitals reviewed. Constitutional: She appears well-developed and well-nourished.  HENT:  Head: Normocephalic and atraumatic.  Eyes: Conjunctivae and EOM are normal. Right eye exhibits no discharge. Left eye exhibits no discharge.  Cardiovascular: Normal rate and regular  rhythm.   Pulmonary/Chest: Effort normal and breath sounds normal. No respiratory distress.  Abdominal: Soft. She exhibits no distension. There is no tenderness. There is no rebound.  Musculoskeletal: Normal range of motion. She exhibits no edema and no tenderness.  Skin: Skin is warm and dry.    ED Course  Procedures (including critical care time) Labs Review Labs Reviewed  COMPREHENSIVE METABOLIC PANEL - Abnormal; Notable for the following:    Glucose, Bld 141 (*)    BUN 25 (*)    Creatinine, Ser 1.26 (*)    AST 40 (*)    GFR calc non Af Amer 39 (*)    GFR calc Af Amer 45 (*)    All other components within normal limits  URINALYSIS, ROUTINE W REFLEX MICROSCOPIC - Abnormal; Notable for the following:    APPearance CLOUDY (*)    Hgb urine dipstick LARGE (*)    Protein, ur 30 (*)    All other components within normal limits  TSH - Abnormal; Notable for the following:    TSH 6.600 (*)    All other components within normal limits  CBC WITH DIFFERENTIAL - Abnormal; Notable for the following:    WBC 14.2 (*)    Neutrophils Relative % 89 (*)    Neutro Abs 12.6 (*)    Lymphocytes Relative 6 (*)    All other components within normal limits  URINE MICROSCOPIC-ADD ON - Abnormal; Notable for the following:    Casts HYALINE CASTS (*)    All other components within normal limits  CK - Abnormal; Notable for the following:    Total CK 2880 (*)    All other components within normal limits  I-STAT CG4 LACTIC ACID, ED - Abnormal; Notable for the following:    Lactic Acid, Venous 2.54 (*)    All other components within normal limits  I-STAT VENOUS BLOOD GAS, ED - Abnormal; Notable for the following:    pCO2, Ven 63.3 (*)    Bicarbonate 27.9 (*)    All other components within normal limits  CULTURE, BLOOD (ROUTINE X 2)  CULTURE, BLOOD (ROUTINE X 2)  BLOOD GAS, VENOUS  CBG MONITORING, ED    Imaging Review Dg Chest 1 View  09/14/2013   CLINICAL DATA:  Found down. Unconscious. Left  shoulder appeared painful.  EXAM: CHEST - 1 VIEW  COMPARISON:  10/12/2009  FINDINGS: Normal heart size and pulmonary vascularity. Probable emphysematous changes in the upper lungs. No focal airspace disease or consolidation. Degenerative changes in the shoulders, greater on the left. No blunting of costophrenic angles. No pneumothorax. Calcification of the aorta. Degenerative changes in the spine.  IMPRESSION: Emphysematous  changes in the lungs. No evidence of active pulmonary disease.   Electronically Signed   By: Lucienne Capers M.D.   On: 09/14/2013 21:59   Dg Clavicle Left  09/14/2013   CLINICAL DATA:  [place], loss of consciousness, LEFT shoulder pain  EXAM: LEFT CLAVICLE - 2+ VIEWS  COMPARISON:  None  FINDINGS: AP internal external views.  Osseous demineralization.  Glenohumeral joint space narrowing.  AC joint alignment normal.  No acute fracture, dislocation or bone destruction.  Atherosclerotic calcification aortic arch.  Visualized LEFT ribs unremarkable.  IMPRESSION: Osseous demineralization with minimal degenerative changes of the LEFT glenohumeral joint.  No definite acute osseous findings.   Electronically Signed   By: Lavonia Dana M.D.   On: 09/14/2013 22:00   Ct Head Wo Contrast  09/14/2013   CLINICAL DATA:  Altered mental status. Evaluate for head or cervical spine injury.  EXAM: CT HEAD WITHOUT CONTRAST  CT CERVICAL SPINE WITHOUT CONTRAST  TECHNIQUE: Multidetector CT imaging of the head and cervical spine was performed following the standard protocol without intravenous contrast. Multiplanar CT image reconstructions of the cervical spine were also generated.  COMPARISON:  CT head 07/14/2016.  FINDINGS: CT HEAD FINDINGS  No evidence for acute infarction, hemorrhage, mass lesion, hydrocephalus, or extra-axial fluid. Generalized atrophy. Chronic microvascular ischemic change. Remote basal ganglia lacunar infarct on the right is stable. Calvarium intact. Vascular calcification. No sinus or mastoid  air-fluid levels. Similar appearance to priors.  CT CERVICAL SPINE FINDINGS  There is no visible cervical spine fracture, traumatic subluxation, prevertebral soft tissue swelling, or intraspinal hematoma. Advanced spondylosis with disc space narrowing. Mild degenerative/facet mediated slip C3-4 and C4-5, as well as C7-T1. Mild central canal stenosis most prominent C3-C4. Carotid atherosclerosis. Changes of COPD with biapical scarring. No worrisome osseous lesions. No neck masses.  IMPRESSION: Atrophy and small vessel disease.  No acute intracranial findings.  Cervical spine degenerative change without acute findings.   Electronically Signed   By: Rolla Flatten M.D.   On: 09/14/2013 22:24   Ct Cervical Spine Wo Contrast  09/14/2013   CLINICAL DATA:  Altered mental status. Evaluate for head or cervical spine injury.  EXAM: CT HEAD WITHOUT CONTRAST  CT CERVICAL SPINE WITHOUT CONTRAST  TECHNIQUE: Multidetector CT imaging of the head and cervical spine was performed following the standard protocol without intravenous contrast. Multiplanar CT image reconstructions of the cervical spine were also generated.  COMPARISON:  CT head 07/14/2016.  FINDINGS: CT HEAD FINDINGS  No evidence for acute infarction, hemorrhage, mass lesion, hydrocephalus, or extra-axial fluid. Generalized atrophy. Chronic microvascular ischemic change. Remote basal ganglia lacunar infarct on the right is stable. Calvarium intact. Vascular calcification. No sinus or mastoid air-fluid levels. Similar appearance to priors.  CT CERVICAL SPINE FINDINGS  There is no visible cervical spine fracture, traumatic subluxation, prevertebral soft tissue swelling, or intraspinal hematoma. Advanced spondylosis with disc space narrowing. Mild degenerative/facet mediated slip C3-4 and C4-5, as well as C7-T1. Mild central canal stenosis most prominent C3-C4. Carotid atherosclerosis. Changes of COPD with biapical scarring. No worrisome osseous lesions. No neck masses.   IMPRESSION: Atrophy and small vessel disease.  No acute intracranial findings.  Cervical spine degenerative change without acute findings.   Electronically Signed   By: Rolla Flatten M.D.   On: 09/14/2013 22:24     EKG Interpretation None      MDM   80 yo F w/ h/o COPD, anxiety, CAD, HTN here with EMS 2/2 AMS, found unresponsive by grandson, no last  known normal. EMS arrived and patient with pinpoint pupils, O2 in 60's. 2mg  narcan given and patient started moaning and O2 improved. Another 1 mg given without further improvement. On arrival here still GCS 13, hypoxic. Pupils appropriate. Initially hypothermic. Concern for sepsis, cva, myxedema coma as causes for her symptoms, appropriate labs and imaging ordered and nothing obvious as cause other than opioid overdose. On reexam patient more alert and oriented, GCS 14, still requiring O2. Admitted to medicine for further workup.   Final diagnoses:  Accidental overdose  Hypothermia  Unspecified hypothyroidism    Merrily Pew, MD 09/15/13 [phone]

## 2013-09-14 NOTE — ED Notes (Signed)
Report attempted 

## 2013-09-14 NOTE — H&P (Signed)
[facility] History and Physical  [name]:[id number] DOB: 08-22-1930 DOA: 09/14/2013  Referring physician: EDP PCP: [name], PA-C   Chief Complaint: AMS   HPI: [name] is a 80 y.o. female found unresponsive at home by grandson.  He called EMS.  Patient has a history of accidental overdose of narcotic pain meds in the past and grandson states he thinks she may have again.  EMS gave the patient narcan and patient went from unresponsive to alert and mostly oriented but still altered.  Patient also was found to have a rectal temp of 92.5 on arrival and put in bear hugger for hypothermia.  Review of Systems: Unable to perform due to AMS  Past Medical History  Diagnosis Date  . COPD (chronic obstructive pulmonary disease)   . Carotid artery occlusion   . Arthritis   . Anxiety     Afraid , living alone  . Myocardial infarction 1987  . Anemia   . Bilateral leg pain     for years  . Femoral bruit   . Bronchitis, chronic   . Fall April 2014    Pt. has fallen twife in the last 2 months.   . Personal history of other diseases of digestive system     Upper GI bleed  . Generalized anxiety disorder   . Nonspecific abnormal electrocardiogram (ECG) (EKG)     Abnormal Results EKG  . Memory loss     Worsening  . Intestinal infection due to Clostridium difficile   . Colitis due to Clostridium difficile   . Hypertension   . CAD (coronary artery disease)   . Thyroid disease     Hypo-Thyroidism   Past Surgical History  Procedure Laterality Date  . Carotid endarterectomy  12/30/2008   Social History:  reports that she quit smoking about 29 years ago. Her smoking use included Cigarettes. She smoked 0.00 packs per day for 15 years. She has never used smokeless tobacco. She reports that she does not drink alcohol or use illicit drugs.  Allergies  Allergen Reactions  . Aspirin     GI  Bleed  . Penicillins     Numbness around mouth  . Biaxin [Clarithromycin]  Nausea Only  . Sulfonamide Derivatives Nausea And Vomiting    Family History  Problem Relation Age of Onset  . Cancer Mother   . Deep vein thrombosis Mother   . Cancer Father   . Kidney disease Father   . Heart disease Father      Prior to Admission medications   Medication Sig Start Date End Date Taking? Authorizing Provider  ALPRAZolam Duanne Moron) 0.5 MG tablet Take 0.5 mg by mouth 2 (two) times daily as needed for sleep or anxiety.     Historical Provider, MD  AMITIZA 24 MCG capsule Take 24 mcg by mouth daily.  02/14/11   Historical Provider, MD  budesonide-formoterol (SYMBICORT) 160-4.5 MCG/ACT inhaler Inhale 2 puffs into the lungs 2 (two) times daily.    Historical Provider, MD  citalopram (CELEXA) 20 MG tablet Take 20 mg by mouth daily.  01/27/11   Historical Provider, MD  donepezil (ARICEPT) 10 MG tablet Take 10 mg by mouth at bedtime.     Historical Provider, MD  felodipine (PLENDIL) 5 MG 24 hr tablet Take 5 mg by mouth daily.    Historical Provider, MD  levothyroxine (SYNTHROID, LEVOTHROID) 88 MCG tablet Take 88 mcg by mouth daily before breakfast.  02/04/11   Historical Provider, MD  nitroGLYCERIN (  NITROSTAT) 0.4 MG SL tablet Place 0.4 mg under the tongue every 5 (five) minutes as needed for chest pain.    Historical Provider, MD  oxyCODONE (OXYCONTIN) 10 MG 12 hr tablet Take 10 mg by mouth every 12 (twelve) hours.    Historical Provider, MD  pregabalin (LYRICA) 75 MG capsule Take 75 mg by mouth 2 (two) times daily.    Historical Provider, MD   Physical Exam: Filed Vitals:   09/14/13 2100  BP: 140/55  Pulse: 70  Temp:   Resp: 13    BP 140/55  Pulse 70  Temp(Src) 92.5 F (33.6 C) (Rectal)  Resp 13  SpO2 90%  General Appearance:    Oriented after narcan, but now back to being sleepy at this time, no distress, appears stated age  Head:    Normocephalic, atraumatic  Eyes:    PERRL, EOMI, sclera non-icteric        Nose:   Nares without drainage or epistaxis. Mucosa,  turbinates normal  Throat:   Moist mucous membranes. Oropharynx without erythema or exudate.  Neck:   Supple. No carotid bruits.  No thyromegaly.  No lymphadenopathy.   Back:     No CVA tenderness, no spinal tenderness  Lungs:     Clear to auscultation bilaterally, without wheezes, rhonchi or rales  Chest wall:    No tenderness to palpitation  Heart:    Regular rate and rhythm without murmurs, gallops, rubs  Abdomen:     Soft, non-tender, nondistended, normal bowel sounds, no organomegaly  Genitalia:    deferred  Rectal:    deferred  Extremities:   No clubbing, cyanosis or edema.  Pulses:   2+ and symmetric all extremities  Skin:   Skin color, texture, turgor normal, no rashes or lesions  Lymph nodes:   Cervical, supraclavicular, and axillary nodes normal  Neurologic:   CNII-XII intact. Normal strength, sensation and reflexes      throughout    Labs on Admission:  Basic Metabolic Panel:  Recent Labs Lab 09/14/13 2025  NA 140  K 3.8  CL 96  CO2 28  GLUCOSE 141*  BUN 25*  CREATININE 1.26*  CALCIUM 9.4   Liver Function Tests:  Recent Labs Lab 09/14/13 2025  AST 40*  ALT 21  ALKPHOS 79  BILITOT 0.4  PROT 7.3  ALBUMIN 3.9   No results found for this basename: LIPASE, AMYLASE,  in the last 168 hours No results found for this basename: AMMONIA,  in the last 168 hours CBC:  Recent Labs Lab 09/14/13 2025  WBC 14.2*  NEUTROABS 12.6*  HGB 14.3  HCT 42.8  MCV 92.8  PLT 292   Cardiac Enzymes: No results found for this basename: CKTOTAL, CKMB, CKMBINDEX, TROPONINI,  in the last 168 hours  BNP (last 3 results) No results found for this basename: PROBNP,  in the last 8760 hours CBG: No results found for this basename: GLUCAP,  in the last 168 hours  Radiological Exams on Admission: Dg Chest 1 View  09/14/2013   CLINICAL DATA:  Found down. Unconscious. Left shoulder appeared painful.  EXAM: CHEST - 1 VIEW  COMPARISON:  10/12/2009  FINDINGS: Normal heart size and  pulmonary vascularity. Probable emphysematous changes in the upper lungs. No focal airspace disease or consolidation. Degenerative changes in the shoulders, greater on the left. No blunting of costophrenic angles. No pneumothorax. Calcification of the aorta. Degenerative changes in the spine.  IMPRESSION: Emphysematous changes in the lungs. No evidence of active pulmonary  disease.   Electronically Signed   By: Lucienne Capers M.D.   On: 09/14/2013 21:59   Dg Clavicle Left  09/14/2013   CLINICAL DATA:  [place], loss of consciousness, LEFT shoulder pain  EXAM: LEFT CLAVICLE - 2+ VIEWS  COMPARISON:  None  FINDINGS: AP internal external views.  Osseous demineralization.  Glenohumeral joint space narrowing.  AC joint alignment normal.  No acute fracture, dislocation or bone destruction.  Atherosclerotic calcification aortic arch.  Visualized LEFT ribs unremarkable.  IMPRESSION: Osseous demineralization with minimal degenerative changes of the LEFT glenohumeral joint.  No definite acute osseous findings.   Electronically Signed   By: Lavonia Dana M.D.   On: 09/14/2013 22:00   Ct Head Wo Contrast  09/14/2013   CLINICAL DATA:  Altered mental status. Evaluate for head or cervical spine injury.  EXAM: CT HEAD WITHOUT CONTRAST  CT CERVICAL SPINE WITHOUT CONTRAST  TECHNIQUE: Multidetector CT imaging of the head and cervical spine was performed following the standard protocol without intravenous contrast. Multiplanar CT image reconstructions of the cervical spine were also generated.  COMPARISON:  CT head 07/14/2016.  FINDINGS: CT HEAD FINDINGS  No evidence for acute infarction, hemorrhage, mass lesion, hydrocephalus, or extra-axial fluid. Generalized atrophy. Chronic microvascular ischemic change. Remote basal ganglia lacunar infarct on the right is stable. Calvarium intact. Vascular calcification. No sinus or mastoid air-fluid levels. Similar appearance to priors.  CT CERVICAL SPINE FINDINGS  There is no visible cervical  spine fracture, traumatic subluxation, prevertebral soft tissue swelling, or intraspinal hematoma. Advanced spondylosis with disc space narrowing. Mild degenerative/facet mediated slip C3-4 and C4-5, as well as C7-T1. Mild central canal stenosis most prominent C3-C4. Carotid atherosclerosis. Changes of COPD with biapical scarring. No worrisome osseous lesions. No neck masses.  IMPRESSION: Atrophy and small vessel disease.  No acute intracranial findings.  Cervical spine degenerative change without acute findings.   Electronically Signed   By: Rolla Flatten M.D.   On: 09/14/2013 22:24   Ct Cervical Spine Wo Contrast  09/14/2013   CLINICAL DATA:  Altered mental status. Evaluate for head or cervical spine injury.  EXAM: CT HEAD WITHOUT CONTRAST  CT CERVICAL SPINE WITHOUT CONTRAST  TECHNIQUE: Multidetector CT imaging of the head and cervical spine was performed following the standard protocol without intravenous contrast. Multiplanar CT image reconstructions of the cervical spine were also generated.  COMPARISON:  CT head 07/14/2016.  FINDINGS: CT HEAD FINDINGS  No evidence for acute infarction, hemorrhage, mass lesion, hydrocephalus, or extra-axial fluid. Generalized atrophy. Chronic microvascular ischemic change. Remote basal ganglia lacunar infarct on the right is stable. Calvarium intact. Vascular calcification. No sinus or mastoid air-fluid levels. Similar appearance to priors.  CT CERVICAL SPINE FINDINGS  There is no visible cervical spine fracture, traumatic subluxation, prevertebral soft tissue swelling, or intraspinal hematoma. Advanced spondylosis with disc space narrowing. Mild degenerative/facet mediated slip C3-4 and C4-5, as well as C7-T1. Mild central canal stenosis most prominent C3-C4. Carotid atherosclerosis. Changes of COPD with biapical scarring. No worrisome osseous lesions. No neck masses.  IMPRESSION: Atrophy and small vessel disease.  No acute intracranial findings.  Cervical spine degenerative  change without acute findings.   Electronically Signed   By: Rolla Flatten M.D.   On: 09/14/2013 22:24    EKG: Independently reviewed.  Assessment/Plan Active Problems:   HYPOTHYROIDISM   Accidental overdose   Hypothermia   1. Accidental overdose of prescription narcotic - AMS has improved after narcan (before worsening when narcan wore off), residual AMS belived to  be due to hypothermia which is being treated with bear hugger.  No respiratory compromise at this point and patient wakes up to voice so will hold off on additional narcan doses for the moment (ordering this PRN instead).  Holding home oxycodone.  Neuro checks on floor.  Continuous pulse ox. 2. Hypothyroidism - TSH 6.6 today in ED, on synthroid, will leave her on her home dose for now though this may need to be increased.  Given clinical picture with response to narcan, dont think she is in Myxedema coma at this point.   Code Status: Full Code  Family Communication: No family in room Disposition Plan: Admit to obs  Time spent: 50 min  [facility] Pager [phone]  If 7AM-7PM, please contact the day team taking care of the patient [email or web address] Password [facility] 09/14/2013, 10:39 PM

## 2013-09-15 LAB — BLOOD GAS, ARTERIAL
Acid-base deficit: 0.6 mmol/L (ref 0.0–2.0)
Bicarbonate: 25.6 mEq/L — ABNORMAL HIGH (ref 20.0–24.0)
DRAWN BY: 21338
O2 Content: 3 L/min
O2 Saturation: 96.2 %
PATIENT TEMPERATURE: 98.6
PCO2 ART: 58.9 mmHg — AB (ref 35.0–45.0)
PH ART: 7.261 — AB (ref 7.350–7.450)
TCO2: 27.4 mmol/L (ref 0–100)
pO2, Arterial: 77.5 mmHg — ABNORMAL LOW (ref 80.0–100.0)

## 2013-09-15 LAB — AMMONIA: AMMONIA: 39 umol/L (ref 11–60)

## 2013-09-15 LAB — GLUCOSE, CAPILLARY: GLUCOSE-CAPILLARY: 99 mg/dL (ref 70–99)

## 2013-09-15 MED ORDER — SODIUM CHLORIDE 0.9 % IV BOLUS (SEPSIS): 500.0000 mL | INTRAVENOUS | Status: DC | PRN

## 2013-09-15 MED ORDER — NALOXONE HCL 0.4 MG/ML IJ SOLN
0.4000 mg | Freq: Once | INTRAMUSCULAR | Status: AC
  Administered 2013-09-15: 0.4 mg via INTRAVENOUS
  Filled 2013-09-15: qty 1

## 2013-09-15 MED ORDER — ALPRAZOLAM 0.5 MG PO TABS
0.5000 mg | ORAL_TABLET | Freq: Every day | ORAL | Status: DC
  Administered 2013-09-16 – 2013-09-20 (×5): 0.5 mg via ORAL
  Filled 2013-09-15 (×5): qty 1

## 2013-09-15 MED ORDER — DEXTROSE-NACL 5-0.45 % IV SOLN
INTRAVENOUS | Status: AC
  Administered 2013-09-15: 13:00:00 via INTRAVENOUS
  Filled 2013-09-15 (×2): qty 1000

## 2013-09-15 NOTE — Progress Notes (Signed)
Pt is more alert, but confused. Pt is asking questions and following commands. Vital signs within normal limits.     .BP 124/43  Pulse 76  Temp(Src) 97.7 F (36.5 C) (Oral)  Resp 18  Ht 5\' 2"  (1.575 m)  Wt 53.661 kg (118 lb 4.8 oz)  BMI 21.63 kg/m2  SpO2 95%

## 2013-09-15 NOTE — ED Provider Notes (Signed)
I saw and evaluated the patient, reviewed the resident's note and I agree with the findings and plan.   EKG Interpretation None     Patient with altered mental status. Has had some improvement with Narcan. Had had some hypoxia. Had been hypothermic. Continues to not be at her baseline. Will admit to internal medicine.  Jasper Riling. Alvino Chapel, MD 09/15/13 1451

## 2013-09-15 NOTE — Evaluation (Signed)
Physical Therapy Evaluation Patient Details Name: [name] MRN: [id number] DOB: 1931/03/31 Today's Date: 09/15/2013   History of Present Illness  80 y.o. female admitted to [facility] on 09/14/13 found down at home by her grandson with presumed accidental medication overdose (per grandson report to ED physician this has happend in the past).  Dx with Toxic encephalopathy causing somnolence secondary to accidental narcotic and benzo overdose.  Pt also with hypothyroidism and hypothermia upon admission to ED.  Pt with significant PMhx of COPD, anxiety, MI, h/o bil leg pain, fall.   Clinical Impression  Pt is more alert, but confused.  "I am not supposed to be here" "How did I get here?" were repeated throughout our session together.  She is very sore and painful to move, but looks comfortable at rest.  She is total assist at this time to stand and get into the recliner chair. Per chart this is the second time that she has taken too much medication.  I do not think between her mobility, her poor cognition, and this being her second accidental overdose that she is no longer safe to live at home alone.  She, at this time, is most appropriate for SNF level rehab and if she does well, maybe transition to ALF.   PT to follow acutely for deficits listed [place].       Follow Up Recommendations SNF    Equipment Recommendations  None recommended by PT    Recommendations for Other Services   NA     Precautions / Restrictions Precautions Precautions: Fall Precaution Comments: h/o falls      Mobility  Bed Mobility Overal bed mobility: Needs Assistance Bed Mobility: Supine to Sit     Supine to sit: Max assist;HOB elevated     General bed mobility comments: Max assist to help progress bil legs over EOB and support trunk to get to sitting.  Pt with weak initiation of movement.   Transfers Overall transfer level: Needs assistance Equipment used: None Transfers: Sit to/from [employer] Sit to Stand: Total assist   Squat pivot transfers: Total assist     General transfer comment: total assist to stand and pivot to the chair.  Pt groaning in pain with mobility.  Flexed knees in standing.  I used the bed pad to support her hips as I totally supported the weight of her trunk and turned her to the chair.   Ambulation/Gait             General Gait Details: unable         Balance Overall balance assessment: Needs assistance Sitting-balance support: Feet unsupported;Feet supported;Bilateral upper extremity supported Sitting balance-Leahy Scale: Poor Sitting balance - Comments: mod assist to maintain sitting EOB.  Postural control: Posterior lean Standing balance support: No upper extremity supported Standing balance-Leahy Scale: Zero                               Pertinent Vitals/Pain See vitals flow sheet. VSS    Home Living Family/patient expects to be discharged to:: Private residence Living Arrangements: Alone Available Help at Discharge: Family;Available PRN/intermittently Type of Home: House Home Access: Level entry     Home Layout: One level Home Equipment: Walker - 2 wheels;Bedside commode;Shower seat;Wheelchair - manual Additional Comments: this is all per pt report and she is questionable historian    Prior Function Level of Independence: Independent  Hand Dominance   Dominant Hand: Right    Extremity/Trunk Assessment   Upper Extremity Assessment: RUE deficits/detail RUE Deficits / Details: right arm seems flaccid, not active movement at all to command         Lower Extremity Assessment: RLE deficits/detail;LLE deficits/detail RLE Deficits / Details: bil lower extremity weakness in standing. Bil knees buckling in WB.  Pt able to move right and left equally from what I can tell.  LLE Deficits / Details: bil lower extremity weakness in standing. Bil knees buckling in WB.  Pt able to move right  and left equally from what I can tell.   Cervical / Trunk Assessment: Other exceptions  Communication   Communication: No difficulties  Cognition Arousal/Alertness: Lethargic Behavior During Therapy: Flat affect Overall Cognitive Status: No family/caregiver present to determine baseline cognitive functioning                      General Comments General comments (skin integrity, edema, etc.): Left O2 [place] on during session because I do not know her baseline O2 use and she is on continuous pulse ox monitoring.           Assessment/Plan    PT Assessment Patient needs continued PT services  PT Diagnosis Difficulty walking;Abnormality of gait;Generalized weakness;Acute pain;Altered mental status   PT Problem List Decreased strength;Decreased activity tolerance;Decreased balance;Decreased mobility;Decreased cognition;Decreased knowledge of use of DME;Decreased coordination;Decreased safety awareness;Decreased knowledge of precautions;Pain  PT Treatment Interventions DME instruction;Gait training;Functional mobility training;Therapeutic exercise;Therapeutic activities;Balance training;Neuromuscular re-education;Cognitive remediation;Patient/family education;Modalities   PT Goals (Current goals can be found in the Care Plan section) Acute Rehab PT Goals Patient Stated Goal: to figure out why she is here PT Goal Formulation: With patient Time For Goal Achievement: 09/29/13 Potential to Achieve Goals: Good    Frequency Min 2X/week   Barriers to discharge Decreased caregiver support         End of Session   Activity Tolerance: Patient limited by fatigue;Patient limited by lethargy;Patient limited by pain Patient left: in chair;with call bell/phone within reach;with chair alarm set Nurse Communication: Mobility status         Time: 6269-4854 PT Time Calculation (min): 40 min   Charges:   PT Evaluation $Initial PT Evaluation Tier I: 1 Procedure PT  Treatments $Therapeutic Activity: 38-52 mins        Darryl Willner B. [place], [place], DPT [phone]   09/15/2013, 5:08 PM

## 2013-09-15 NOTE — Progress Notes (Signed)
Patient Demographics  [name], is a 80 y.o. female, DOB - Jun 11, 1930, WYO:[id number]  Admit date - 09/14/2013   Admitting Physician Etta Quill, DO  Outpatient Primary MD for the patient is GRAY, Erick Colace, PA-C  LOS - 1   Chief Complaint  Patient presents with  . Altered Mental Status           Subjective:   [name] today is somnolent but opens eyes to questions and noxious stimuli, denies any headache chest or abdominal pain. Denies any neck pain.   Assessment & Plan   1. Toxic encephalopathy causing somnolence secondary to accidental narcotic and benzo overdose. Discussed with son bedside, she has history of same in the past, responded to not in somewhat however she is still under the effect of benzos, start her on IV fluids, also check ammonia level and ABG. CT head and C-spine stable. No apparent focal deficits. Supportive care for now. Have advised grandson to inform PCP about ongoing prescription drug abuse.    2. Hypothyroidism. TSH 6.6. Commence home dose Synthroid once able to take by mouth.   3. Hypothermia - poor admission resolved.   Code Status: Full  Family Communication: Grandson bedside  Disposition Plan: To be decided   Procedures CT head and C-spine   Consults      Medications  Scheduled Meds: . [START ON 09/16/2013] ALPRAZolam  0.5 mg Oral QHS  . budesonide-formoterol  2 puff Inhalation BID  . donepezil  10 mg Oral QHS  . felodipine  5 mg Oral Daily  . heparin  5,000 Units Subcutaneous 3 times per day  . levothyroxine  88 mcg Oral QAC breakfast   Continuous Infusions: . dextrose 5 % and 0.45% NaCl     PRN Meds:.naLOXone (NARCAN)  injection, nitroGLYCERIN, sodium chloride  DVT Prophylaxis  Heparin    Lab Results  Component Value  Date   PLT 292 09/14/2013    Antibiotics    Anti-infectives   None          Objective:   Filed Vitals:   09/14/13 2230 09/14/13 2300 09/14/13 2349 09/15/13 0500  BP: 104/45 103/42 109/45 110/54  Pulse: 55 63 64 60  Temp:   97.4 F (36.3 C) 97.3 F (36.3 C)  TempSrc:   Oral Oral  Resp: 11 12 14 14   Height:   5\' 2"  (1.575 m)   Weight:   53.661 kg (118 lb 4.8 oz)   SpO2: 100% 100% 97% 99%    Wt Readings from Last 3 Encounters:  09/14/13 53.661 kg (118 lb 4.8 oz)  09/19/12 49.442 kg (109 lb)  09/19/12 49.442 kg (109 lb)    No intake or output data in the 24 hours ending 09/15/13 1124   Physical Exam Somnolent but opens eyes to verbal commands and noxious stimuli, moving all 4 extremities to noxious stimuli. [place].AT,PERRAL Supple Neck, no neck tenderness, good range of motion and C-spine, c-collar removed, No JVD, No cervical lymphadenopathy appriciated.  Symmetrical Chest wall movement, Good air movement bilaterally, CTAB RRR,No Gallops,Rubs or new Murmurs, No Parasternal Heave +ve B.Sounds, Abd Soft, No tenderness, No organomegaly appriciated, No rebound - guarding or rigidity. No Cyanosis, Clubbing or edema, No new Rash or bruise  Data Review   Micro Results No results found for this or any previous visit (from the past 240 hour(s)).  Radiology Reports Dg Chest 1 View  09/14/2013   CLINICAL DATA:  Found down. Unconscious. Left shoulder appeared painful.  EXAM: CHEST - 1 VIEW  COMPARISON:  10/12/2009  FINDINGS: Normal heart size and pulmonary vascularity. Probable emphysematous changes in the upper lungs. No focal airspace disease or consolidation. Degenerative changes in the shoulders, greater on the left. No blunting of costophrenic angles. No pneumothorax. Calcification of the aorta. Degenerative changes in the spine.  IMPRESSION: Emphysematous changes in the lungs. No evidence of active pulmonary disease.   Electronically Signed   By: Lucienne Capers M.D.    On: 09/14/2013 21:59   Dg Clavicle Left  09/14/2013   CLINICAL DATA:  [place], loss of consciousness, LEFT shoulder pain  EXAM: LEFT CLAVICLE - 2+ VIEWS  COMPARISON:  None  FINDINGS: AP internal external views.  Osseous demineralization.  Glenohumeral joint space narrowing.  AC joint alignment normal.  No acute fracture, dislocation or bone destruction.  Atherosclerotic calcification aortic arch.  Visualized LEFT ribs unremarkable.  IMPRESSION: Oseous demineralization with minimal degenerative changes of the LEFT glenohumeral joint.  No definite acute osseous findings.s   Electronically Signed   By: Lavonia Dana M.D.   On: 09/14/2013 22:00      Ct Head and Cervical Spine Wo Contrast  09/14/2013   CLINICAL DATA:  Altered mental status. Evaluate for head or cervical spine injury.  EXAM: CT HEAD WITHOUT CONTRAST  CT CERVICAL SPINE WITHOUT CONTRAST  TECHNIQUE: Multidetector CT imaging of the head and cervical spine was performed following the standard protocol without intravenous contrast. Multiplanar CT image reconstructions of the cervical spine were also generated.  COMPARISON:  CT head 07/14/2016.  FINDINGS: CT HEAD FINDINGS  No evidence for acute infarction, hemorrhage, mass lesion, hydrocephalus, or extra-axial fluid. Generalized atrophy. Chronic microvascular ischemic change. Remote basal ganglia lacunar infarct on the right is stable. Calvarium intact. Vascular calcification. No sinus or mastoid air-fluid levels. Similar appearance to priors.  CT CERVICAL SPINE FINDINGS  There is no visible cervical spine fracture, traumatic subluxation, prevertebral soft tissue swelling, or intraspinal hematoma. Advanced spondylosis with disc space narrowing. Mild degenerative/facet mediated slip C3-4 and C4-5, as well as C7-T1. Mild central canal stenosis most prominent C3-C4. Carotid atherosclerosis. Changes of COPD with biapical scarring. No worrisome osseous lesions. No neck masses.  IMPRESSION: Atrophy and small vessel  disease.  No acute intracranial findings.  Cervical spine degenerative change without acute findings.   Electronically Signed   By: Rolla Flatten M.D.   On: 09/14/2013 22:24    CBC  Recent Labs Lab 09/14/13 2025  WBC 14.2*  HGB 14.3  HCT 42.8  PLT 292  MCV 92.8  MCH 31.0  MCHC 33.4  RDW 13.4  LYMPHSABS 0.9  MONOABS 0.8  EOSABS 0.0  BASOSABS 0.0    Chemistries   Recent Labs Lab 09/14/13 2025  NA 140  K 3.8  CL 96  CO2 28  GLUCOSE 141*  BUN 25*  CREATININE 1.26*  CALCIUM 9.4  AST 40*  ALT 21  ALKPHOS 79  BILITOT 0.4   ------------------------------------------------------------------------------------------------------------------ estimated creatinine clearance is 27.7 ml/min (by C-G formula based on Cr of 1.26). ------------------------------------------------------------------------------------------------------------------ No results found for this basename: HGBA1C,  in the last 72 hours ------------------------------------------------------------------------------------------------------------------ No results found for this basename: CHOL, HDL, LDLCALC, TRIG, CHOLHDL, LDLDIRECT,  in the last 72 hours ------------------------------------------------------------------------------------------------------------------  Recent Labs  09/14/13 2025  TSH 6.600*   ------------------------------------------------------------------------------------------------------------------ No results found for this basename: VITAMINB12, FOLATE, FERRITIN, TIBC, IRON, RETICCTPCT,  in the last 72 hours  Coagulation profile No results found for this basename: INR, PROTIME,  in the last 168 hours  No results found for this basename: DDIMER,  in the last 72 hours  Cardiac Enzymes No results found for this basename: CK, CKMB, TROPONINI, MYOGLOBIN,  in the last 168  hours ------------------------------------------------------------------------------------------------------------------ No components found with this basename: POCBNP,      Time Spent in minutes   35   Thurnell Lose M.D on 09/15/2013 at 11:24 AM  Between 7am to 7pm - Pager - [phone]  After 7pm go to [email or web address] - password [email or web address]  And look for the night coverage person covering for me after hours  [facility] Office  [phone]   **Disclaimer: This note may have been dictated with voice recognition software. Similar sounding words can inadvertently be transcribed and this note may contain transcription errors which may not have been corrected upon publication of note.**

## 2013-09-15 NOTE — Progress Notes (Signed)
Received pt from the ED, alert but disoriented x4. C-collar in place. V/S taken, O2 at 2l with sats at 97%. Pt and family oriented to unit and room. Call bell within reach. O2 sats continually monitored.

## 2013-09-15 NOTE — Progress Notes (Signed)
Pt is somnolent but opens eyes to questions and attempts to follow commands but too drowsy.  Narcan given and pt responded by moving arms and moaning. Will continue to monitor pt.   Last set of VS .BP 160/55  Pulse 76  Temp(Src) 98.6 F (37 C) (Oral)  Resp 18  Ht 5\' 2"  (1.575 m)  Wt 53.661 kg (118 lb 4.8 oz)  BMI 21.63 kg/m2  SpO2 95%

## 2013-09-16 ENCOUNTER — Observation Stay (HOSPITAL_COMMUNITY): Payer: Medicare Other

## 2013-09-16 LAB — LACTIC ACID, PLASMA: Lactic Acid, Venous: 1.2 mmol/L (ref 0.5–2.2)

## 2013-09-16 LAB — BASIC METABOLIC PANEL
BUN: 15 mg/dL (ref 6–23)
CALCIUM: 8.5 mg/dL (ref 8.4–10.5)
CHLORIDE: 104 meq/L (ref 96–112)
CO2: 25 meq/L (ref 19–32)
CREATININE: 0.69 mg/dL (ref 0.50–1.10)
GFR calc non Af Amer: 79 mL/min — ABNORMAL LOW (ref >90)
Glucose, Bld: 146 mg/dL — ABNORMAL HIGH (ref 70–99)
Potassium: 4.3 mEq/L (ref 3.7–5.3)
Sodium: 141 mEq/L (ref 137–147)

## 2013-09-16 LAB — CBC
HEMATOCRIT: 39.1 % (ref 36.0–46.0)
Hemoglobin: 13.3 g/dL (ref 12.0–15.0)
MCH: 31.4 pg (ref 26.0–34.0)
MCHC: 34 g/dL (ref 30.0–36.0)
MCV: 92.2 fL (ref 78.0–100.0)
PLATELETS: 142 10*3/uL — AB (ref 150–400)
RBC: 4.24 MIL/uL (ref 3.87–5.11)
RDW: 13.4 % (ref 11.5–15.5)
WBC: 10.5 10*3/uL (ref 4.0–10.5)

## 2013-09-16 LAB — CK: Total CK: 1631 U/L — ABNORMAL HIGH (ref 7–177)

## 2013-09-16 NOTE — Progress Notes (Signed)
EEG completed; results pending.    

## 2013-09-16 NOTE — Progress Notes (Signed)
[facility] PROGRESS NOTE  [name] JJO:[id number] DOB: 1930-06-25 DOA: 09/14/2013 PCP: [name], PA-C  Assessment/Plan: 80 y/o female with PMH of COPD on home oxygen, HTN, HPL, CAD, dementia, DJD chronic pain presented with opioid+menzo intoxication; Py is found unresponsive at home by grandson. He called EMS. Patient has a history of accidental overdose of narcotic pain meds in the past and grandson states he thinks she may have again. EMS gave the patient narcan and patient went from unresponsive to alert and mostly oriented but still altered. Patient also was found to have a rectal temp of 92.5 on arrival and put in bear hugger for hypothermia.  1. Toxic encephalopathy causing somnolence secondary to accidental narcotic and benzo overdose. -Discussed with daughter,  grandson bedside, she has history of same in the past to avoid overdose; Have advised grandson to inform PCP about ongoing prescription drug abuse.  -encephalopathy symptoms resolved; CT head and C-spine stable. But still have R sided UE weakness; -obtain hed MRI r/o CVA; EEG r/o seizure; PT eval;   2. Acute on chronic respiratory failure due to drug overdose on top of COPD -ABG 7.26-58-77-25 -symptoms resolved; CXR: Emphysematous changes in the lungs. No evidence of active pulmonary  Disease. -cont oxygen, prn bronchodilators,  3. Hypothyroidism. TSH 6.6. Commence home dose Synthroid once able to take by mouth.  4. Hypothermia -resolved.  D/w patient, confirmed with her daughter  Pt is DNR   Code Status: DNR Family Communication: d/w patient, her daughter  (indicate person spoken with, relationship, and if by phone, the number) Disposition Plan: ? SNF   Consultants:  none  Procedures:  none  Antibiotics:  none (indicate start date, and stop date if known)  HPI/Subjective: alert  Objective: Filed Vitals:   09/16/13 1357  BP: 145/48  Pulse: 76  Temp: 97.6 F (36.4 C)  Resp: 20     Intake/Output Summary (Last 24 hours) at 09/16/13 1458 Last data filed at 09/16/13 0840  Gross per 24 hour  Intake    830 ml  Output    850 ml  Net    -20 ml   Filed Weights   09/14/13 2349  Weight: 53.661 kg (118 lb 4.8 oz)    Exam:   General:  alert  Cardiovascular: s1,s2 rrr  Respiratory: CTA BL  Abdomen: soft, nt,nd   Musculoskeletal: no LE edema   Data Reviewed: Basic Metabolic Panel:  Recent Labs Lab 09/14/13 2025 09/16/13 0540  NA 140 141  K 3.8 4.3  CL 96 104  CO2 28 25  GLUCOSE 141* 146*  BUN 25* 15  CREATININE 1.26* 0.69  CALCIUM 9.4 8.5   Liver Function Tests:  Recent Labs Lab 09/14/13 2025  AST 40*  ALT 21  ALKPHOS 79  BILITOT 0.4  PROT 7.3  ALBUMIN 3.9   No results found for this basename: LIPASE, AMYLASE,  in the last 168 hours  Recent Labs Lab 09/15/13 1206  AMMONIA 39   CBC:  Recent Labs Lab 09/14/13 2025 09/16/13 0540  WBC 14.2* 10.5  NEUTROABS 12.6*  --   HGB 14.3 13.3  HCT 42.8 39.1  MCV 92.8 92.2  PLT 292 142*   Cardiac Enzymes:  Recent Labs Lab 09/14/13 2242 09/16/13 0540  CKTOTAL 2880* 1631*   BNP (last 3 results) No results found for this basename: PROBNP,  in the last 8760 hours CBG:  Recent Labs Lab 09/15/13 0423  GLUCAP 99    Recent Results (from the past 240  hour(s))  CULTURE, BLOOD (ROUTINE X 2)     Status: None   Collection Time    09/14/13  8:25 PM      Result Value Ref Range Status   Specimen Description BLOOD ARM LEFT   Final   Special Requests BOTTLES DRAWN AEROBIC AND ANAEROBIC 5CC   Final   Culture  Setup Time     Final   Value: 09/15/2013 03:49     Performed at [facility]   Culture     Final   Value:        BLOOD CULTURE RECEIVED NO GROWTH TO DATE CULTURE WILL BE HELD FOR 5 DAYS BEFORE ISSUING A FINAL NEGATIVE REPORT     Performed at [facility]   Report Status PENDING   Incomplete  CULTURE, BLOOD (ROUTINE X 2)     Status: None   Collection Time     09/14/13  9:00 PM      Result Value Ref Range Status   Specimen Description BLOOD RIGHT HAND   Final   Special Requests BOTTLES DRAWN AEROBIC ONLY 3CC   Final   Culture  Setup Time     Final   Value: 09/15/2013 03:49     Performed at [facility]   Culture     Final   Value:        BLOOD CULTURE RECEIVED NO GROWTH TO DATE CULTURE WILL BE HELD FOR 5 DAYS BEFORE ISSUING A FINAL NEGATIVE REPORT     Performed at [facility]   Report Status PENDING   Incomplete     Studies: Dg Chest 1 View  09/14/2013   CLINICAL DATA:  Found down. Unconscious. Left shoulder appeared painful.  EXAM: CHEST - 1 VIEW  COMPARISON:  10/12/2009  FINDINGS: Normal heart size and pulmonary vascularity. Probable emphysematous changes in the upper lungs. No focal airspace disease or consolidation. Degenerative changes in the shoulders, greater on the left. No blunting of costophrenic angles. No pneumothorax. Calcification of the aorta. Degenerative changes in the spine.  IMPRESSION: Emphysematous changes in the lungs. No evidence of active pulmonary disease.   Electronically Signed   By: Lucienne Capers M.D.   On: 09/14/2013 21:59   Dg Clavicle Left  09/14/2013   CLINICAL DATA:  [place], loss of consciousness, LEFT shoulder pain  EXAM: LEFT CLAVICLE - 2+ VIEWS  COMPARISON:  None  FINDINGS: AP internal external views.  Osseous demineralization.  Glenohumeral joint space narrowing.  AC joint alignment normal.  No acute fracture, dislocation or bone destruction.  Atherosclerotic calcification aortic arch.  Visualized LEFT ribs unremarkable.  IMPRESSION: Osseous demineralization with minimal degenerative changes of the LEFT glenohumeral joint.  No definite acute osseous findings.   Electronically Signed   By: Lavonia Dana M.D.   On: 09/14/2013 22:00   Ct Head Wo Contrast  09/14/2013   CLINICAL DATA:  Altered mental status. Evaluate for head or cervical spine injury.  EXAM: CT HEAD WITHOUT CONTRAST  CT CERVICAL SPINE  WITHOUT CONTRAST  TECHNIQUE: Multidetector CT imaging of the head and cervical spine was performed following the standard protocol without intravenous contrast. Multiplanar CT image reconstructions of the cervical spine were also generated.  COMPARISON:  CT head 07/14/2016.  FINDINGS: CT HEAD FINDINGS  No evidence for acute infarction, hemorrhage, mass lesion, hydrocephalus, or extra-axial fluid. Generalized atrophy. Chronic microvascular ischemic change. Remote basal ganglia lacunar infarct on the right is stable. Calvarium intact. Vascular calcification. No sinus or mastoid air-fluid levels.  Similar appearance to priors.  CT CERVICAL SPINE FINDINGS  There is no visible cervical spine fracture, traumatic subluxation, prevertebral soft tissue swelling, or intraspinal hematoma. Advanced spondylosis with disc space narrowing. Mild degenerative/facet mediated slip C3-4 and C4-5, as well as C7-T1. Mild central canal stenosis most prominent C3-C4. Carotid atherosclerosis. Changes of COPD with biapical scarring. No worrisome osseous lesions. No neck masses.  IMPRESSION: Atrophy and small vessel disease.  No acute intracranial findings.  Cervical spine degenerative change without acute findings.   Electronically Signed   By: Rolla Flatten M.D.   On: 09/14/2013 22:24   Ct Cervical Spine Wo Contrast  09/14/2013   CLINICAL DATA:  Altered mental status. Evaluate for head or cervical spine injury.  EXAM: CT HEAD WITHOUT CONTRAST  CT CERVICAL SPINE WITHOUT CONTRAST  TECHNIQUE: Multidetector CT imaging of the head and cervical spine was performed following the standard protocol without intravenous contrast. Multiplanar CT image reconstructions of the cervical spine were also generated.  COMPARISON:  CT head 07/14/2016.  FINDINGS: CT HEAD FINDINGS  No evidence for acute infarction, hemorrhage, mass lesion, hydrocephalus, or extra-axial fluid. Generalized atrophy. Chronic microvascular ischemic change. Remote basal ganglia lacunar  infarct on the right is stable. Calvarium intact. Vascular calcification. No sinus or mastoid air-fluid levels. Similar appearance to priors.  CT CERVICAL SPINE FINDINGS  There is no visible cervical spine fracture, traumatic subluxation, prevertebral soft tissue swelling, or intraspinal hematoma. Advanced spondylosis with disc space narrowing. Mild degenerative/facet mediated slip C3-4 and C4-5, as well as C7-T1. Mild central canal stenosis most prominent C3-C4. Carotid atherosclerosis. Changes of COPD with biapical scarring. No worrisome osseous lesions. No neck masses.  IMPRESSION: Atrophy and small vessel disease.  No acute intracranial findings.  Cervical spine degenerative change without acute findings.   Electronically Signed   By: Rolla Flatten M.D.   On: 09/14/2013 22:24    Scheduled Meds: . ALPRAZolam  0.5 mg Oral QHS  . budesonide-formoterol  2 puff Inhalation BID  . donepezil  10 mg Oral QHS  . felodipine  5 mg Oral Daily  . heparin  5,000 Units Subcutaneous 3 times per day  . levothyroxine  88 mcg Oral QAC breakfast   Continuous Infusions:   Active Problems:   HYPOTHYROIDISM   Accidental overdose   Hypothermia    Time spent: >35 minutes     Kinnie Feil  [facility] Pager [phone]. If 7PM-7AM, please contact night-coverage at [email or web address], password [email or web address] 09/16/2013, 2:58 PM  LOS: 2 days

## 2013-09-16 NOTE — Procedures (Signed)
History: 80 year old female with a history of dementia present with narcotic overdose.  Sedation: None  Technique: This is a 17 channel routine scalp EEG performed at the bedside with bipolar and monopolar montages arranged in accordance to the international 10/20 system of electrode placement. One channel was dedicated to EKG recording.    Background: The background and some slowing of the posterior rhythms achieving a maximal frequency of 7.5 Hz. There is some generalized irregular delta activity as well. There are sleep structures which are bilaterally symmetric in distribution.  Photic stimulation: Physiologic driving is performed  EEG Abnormalities: 1) generalized regular slow activity 2) slow PDR   Clinical Interpretation: This EEG is consistent with a mild generalized non-specific cerebral dysfunction(encephalopathy). There was no seizure or seizure predisposition recorded on this study.   Roland Rack, MD [facility] [phone]  If 7pm- 7am, please page neurology on call as listed in [place].

## 2013-09-17 ENCOUNTER — Observation Stay (HOSPITAL_COMMUNITY): Payer: Medicare Other

## 2013-09-17 DIAGNOSIS — I6350 Cerebral infarction due to unspecified occlusion or stenosis of unspecified cerebral artery: Secondary | ICD-10-CM

## 2013-09-17 LAB — LIPID PANEL
CHOLESTEROL: 219 mg/dL — AB (ref 0–200)
HDL: 74 mg/dL (ref >39)
LDL CALC: 122 mg/dL — AB (ref 0–99)
TRIGLYCERIDES: 117 mg/dL (ref <150)
Total CHOL/HDL Ratio: 3 RATIO
VLDL: 23 mg/dL (ref 0–40)

## 2013-09-17 LAB — URINE MICROSCOPIC-ADD ON

## 2013-09-17 LAB — URINALYSIS, ROUTINE W REFLEX MICROSCOPIC
Bilirubin Urine: NEGATIVE
GLUCOSE, UA: NEGATIVE mg/dL
KETONES UR: NEGATIVE mg/dL
Nitrite: POSITIVE — AB
Protein, ur: NEGATIVE mg/dL
Specific Gravity, Urine: 1.011 (ref 1.005–1.030)
Urobilinogen, UA: 0.2 mg/dL (ref 0.0–1.0)
pH: 6.5 (ref 5.0–8.0)

## 2013-09-17 LAB — HEMOGLOBIN A1C
HEMOGLOBIN A1C: 5.6 % (ref <5.7)
Mean Plasma Glucose: 114 mg/dL (ref <117)

## 2013-09-17 MED ORDER — LEVOFLOXACIN IN D5W 250 MG/50ML IV SOLN
250.0000 mg | INTRAVENOUS | Status: DC
  Administered 2013-09-17: 250 mg via INTRAVENOUS
  Filled 2013-09-17 (×2): qty 50

## 2013-09-17 MED ORDER — CLOPIDOGREL BISULFATE 75 MG PO TABS
75.0000 mg | ORAL_TABLET | Freq: Every day | ORAL | Status: DC
  Administered 2013-09-17 – 2013-09-21 (×5): 75 mg via ORAL
  Filled 2013-09-17 (×6): qty 1

## 2013-09-17 NOTE — Consult Note (Signed)
Referring Physician: Daleen Bo    Chief Complaint: Stroke  HPI:                                                                                                                                         [name] is an 80 y.o. female found unresponsive at home by grandson. He called EMS. EMS gave the patient narcan. On arrival patient was encephalopathic and hypothermic.  patient since has regained temperature to normothermic. Due to right UE weakness,  MRI brain was obtained.  Results showed a punctate acute right occipital infarct.  Currently patient is complaining of pain in the right UE from shoulder to hand.  She is reluctant to move the arm due to pain. She states the majority of pain is in her shoulder but also in her hand. She denies any visual changes or left sided weakness or numbness.      Date last known well: Unable to determine Time last known well: Unable to determine tPA Given: No: out of window  Past Medical History  Diagnosis Date  . COPD (chronic obstructive pulmonary disease)   . Carotid artery occlusion   . Arthritis   . Anxiety     Afraid , living alone  . Myocardial infarction 1987  . Anemia   . Bilateral leg pain     for years  . Femoral bruit   . Bronchitis, chronic   . Fall April 2014    Pt. has fallen twife in the last 2 months.   . Personal history of other diseases of digestive system     Upper GI bleed  . Generalized anxiety disorder   . Nonspecific abnormal electrocardiogram (ECG) (EKG)     Abnormal Results EKG  . Memory loss     Worsening  . Intestinal infection due to Clostridium difficile   . Colitis due to Clostridium difficile   . Hypertension   . CAD (coronary artery disease)   . Thyroid disease     Hypo-Thyroidism    Past Surgical History  Procedure Laterality Date  . Carotid endarterectomy  12/30/2008    Family History  Problem Relation Age of Onset  . Cancer Mother   . Deep vein thrombosis Mother   . Cancer Father   . Kidney  disease Father   . Heart disease Father    Social History:  reports that she quit smoking about 29 years ago. Her smoking use included Cigarettes. She smoked 0.00 packs per day for 15 years. She has never used smokeless tobacco. She reports that she does not drink alcohol or use illicit drugs.  Allergies:  Allergies  Allergen Reactions  . Aspirin     GI  Bleed  . Penicillins     Numbness around mouth  . Biaxin [Clarithromycin] Nausea Only  . Sulfonamide Derivatives Nausea And Vomiting  . Codeine Nausea And Vomiting  . Hydrocodone Nausea  And Vomiting  . Sulfa Antibiotics Nausea And Vomiting    Medications:                                                                                                                           Prior to Admission:  Prescriptions prior to admission  Medication Sig Dispense Refill  . ALPRAZolam (XANAX) 0.5 MG tablet Take 0.5 mg by mouth 2 (two) times daily as needed for sleep or anxiety.       . AMITIZA 24 MCG capsule Take 24 mcg by mouth daily.       . budesonide-formoterol (SYMBICORT) 160-4.5 MCG/ACT inhaler Inhale 2 puffs into the lungs 2 (two) times daily as needed (for shortness of breath).       . citalopram (CELEXA) 20 MG tablet Take 20 mg by mouth daily.       . diphenhydramine-acetaminophen (TYLENOL PM) 25-500 MG TABS Take 1 tablet by mouth at bedtime.      . donepezil (ARICEPT) 10 MG tablet Take 10 mg by mouth at bedtime.       . felodipine (PLENDIL) 5 MG 24 hr tablet Take 5 mg by mouth daily.      Marland Kitchen levothyroxine (SYNTHROID, LEVOTHROID) 100 MCG tablet Take 100 mcg by mouth daily before breakfast.       . nitroGLYCERIN (NITROSTAT) 0.4 MG SL tablet Place 0.4 mg under the tongue every 5 (five) minutes as needed for chest pain.      Marland Kitchen oxyCODONE (OXYCONTIN) 10 MG 12 hr tablet Take 10 mg by mouth every 12 (twelve) hours.      . sertraline (ZOLOFT) 25 MG tablet Take 25 mg by mouth daily.        Scheduled: . ALPRAZolam  0.5 mg Oral QHS  .  budesonide-formoterol  2 puff Inhalation BID  . clopidogrel  75 mg Oral Q breakfast  . donepezil  10 mg Oral QHS  . felodipine  5 mg Oral Daily  . heparin  5,000 Units Subcutaneous 3 times per day  . levofloxacin (LEVAQUIN) IV  250 mg Intravenous Q24H  . levothyroxine  88 mcg Oral QAC breakfast    ROS:                                                                                                                                       History obtained from the patient  General ROS: negative for - chills, fatigue, fever, night sweats, weight gain or weight loss Psychological ROS: negative for - behavioral disorder, hallucinations, memory difficulties, mood swings or suicidal ideation Ophthalmic ROS: negative for - blurry vision, double vision, eye pain or loss of vision ENT ROS: negative for - epistaxis, nasal discharge, oral lesions, sore throat, tinnitus or vertigo Allergy and Immunology ROS: negative for - hives or itchy/watery eyes Hematological and Lymphatic ROS: negative for - bleeding problems, bruising or swollen lymph nodes Endocrine ROS: negative for - galactorrhea, hair pattern changes, polydipsia/polyuria or temperature intolerance Respiratory ROS: negative for - cough, hemoptysis, shortness of breath or wheezing Cardiovascular ROS: negative for - chest pain, dyspnea on exertion, edema or irregular heartbeat Gastrointestinal ROS: negative for - abdominal pain, diarrhea, hematemesis, nausea/vomiting or stool incontinence Genito-Urinary ROS: negative for - dysuria, hematuria, incontinence or urinary frequency/urgency Musculoskeletal ROS: negative for - joint swelling or muscular weakness Neurological ROS: as noted in HPI Dermatological ROS: negative for rash and skin lesion changes  Neurologic Examination:                                                                                                      Blood pressure 116/51, pulse 73, temperature 97.8 F (36.6 C), temperature  source Oral, resp. rate 18, height 5\' 2"  (1.575 m), weight 53.661 kg (118 lb 4.8 oz), SpO2 95.00%.   Mental Status: Alert , oriented to hospital and year but refuses to state the month.  Speech fluent without evidence of aphasia.  Able to follow 3 step commands without difficulty. Cranial Nerves: II: Discs flat bilaterally; Visual fields grossly normal, pupils equal, round, reactive to light and accommodation III,IV, VI: ptosis not present, extra-ocular motions intact bilaterally V,VII: smile symmetric, facial light touch sensation normal bilaterally VIII: hearing normal bilaterally IX,X: gag reflex present XI: bilateral shoulder shrug XII: midline tongue extension without atrophy or fasciculations  Motor: Right : Upper extremity   2/5 (refuses to use due to pain) Left:     Upper extremity   5/5  Lower extremity   5/5      Lower extremity   5/5 Tone and bulk:normal tone throughout; no atrophy noted Sensory: Pinprick and light touch intact throughout, bilaterally Deep Tendon Reflexes:  Right: Upper Extremity   Left: Upper extremity   biceps (C-5 to C-6) 2/4   biceps (C-5 to C-6) 2/4 tricep (C7) 2/4    triceps (C7) 2/4 Brachioradialis (C6) 2/4  Brachioradialis (C6) 2/4  Lower Extremity Lower Extremity  quadriceps (L-2 to L-4) 2/4   quadriceps (L-2 to L-4) 2/4 Achilles (S1) 1/4   Achilles (S1) 1/4  Plantars: Right: downgoing   Left: downgoing Cerebellar: normal finger-to-nose on the left,  normal heel-to-shin test bilaterally Gait: not tested CV: pulses palpable throughout    Lab Results: Basic Metabolic Panel:  Recent Labs Lab 09/14/13 2025 09/16/13 0540  NA 140 141  K 3.8 4.3  CL 96 104  CO2 28 25  GLUCOSE 141* 146*  BUN 25* 15  CREATININE 1.26* 0.69  CALCIUM 9.4 8.5    Liver  Function Tests:  Recent Labs Lab 09/14/13 2025  AST 40*  ALT 21  ALKPHOS 79  BILITOT 0.4  PROT 7.3  ALBUMIN 3.9   No results found for this basename: LIPASE, AMYLASE,  in the  last 168 hours  Recent Labs Lab 09/15/13 1206  AMMONIA 39    CBC:  Recent Labs Lab 09/14/13 2025 09/16/13 0540  WBC 14.2* 10.5  NEUTROABS 12.6*  --   HGB 14.3 13.3  HCT 42.8 39.1  MCV 92.8 92.2  PLT 292 142*    Cardiac Enzymes:  Recent Labs Lab 09/14/13 2242 09/16/13 0540  CKTOTAL 2880* 1631*    Lipid Panel: No results found for this basename: CHOL, TRIG, HDL, CHOLHDL, VLDL, LDLCALC,  in the last 168 hours  CBG:  Recent Labs Lab 09/15/13 0423  GLUCAP 108    Microbiology: Results for orders placed during the hospital encounter of 09/14/13  CULTURE, BLOOD (ROUTINE X 2)     Status: None   Collection Time    09/14/13  8:25 PM      Result Value Ref Range Status   Specimen Description BLOOD ARM LEFT   Final   Special Requests BOTTLES DRAWN AEROBIC AND ANAEROBIC 5CC   Final   Culture  Setup Time     Final   Value: 09/15/2013 03:49     Performed at [facility]   Culture     Final   Value:        BLOOD CULTURE RECEIVED NO GROWTH TO DATE CULTURE WILL BE HELD FOR 5 DAYS BEFORE ISSUING A FINAL NEGATIVE REPORT     Performed at [facility]   Report Status PENDING   Incomplete  CULTURE, BLOOD (ROUTINE X 2)     Status: None   Collection Time    09/14/13  9:00 PM      Result Value Ref Range Status   Specimen Description BLOOD RIGHT HAND   Final   Special Requests BOTTLES DRAWN AEROBIC ONLY 3CC   Final   Culture  Setup Time     Final   Value: 09/15/2013 03:49     Performed at [facility]   Culture     Final   Value:        BLOOD CULTURE RECEIVED NO GROWTH TO DATE CULTURE WILL BE HELD FOR 5 DAYS BEFORE ISSUING A FINAL NEGATIVE REPORT     Performed at [facility]   Report Status PENDING   Incomplete    Coagulation Studies: No results found for this basename: LABPROT, INR,  in the last 72 hours  Imaging: Mr Brain Wo Contrast  09/17/2013   CLINICAL DATA:  Found unresponsive by grandson.  EXAM: MRI HEAD WITHOUT CONTRAST   TECHNIQUE: Multiplanar, multiecho pulse sequences of the brain and surrounding structures were obtained without intravenous contrast.  COMPARISON:  09/14/2013 CT.  No comparison MR.  FINDINGS: Suggestion of tiny acute infarct right occipital lobe.  Hemorrhagic breakdown products within the right central sulcus. This may reflect result of prior trauma (not seen as acute hemorrhage on accompanying CT). No other areas of intracranial hemorrhage noted.  Moderate small vessel disease type changes.  Remote tiny mid centrum semiovale infarct.  Global atrophy without hydrocephalus.  No intracranial mass lesion noted on this unenhanced exam.  Transverse ligament hypertrophy. Cervical medullary junction, pituitary region, pineal region and orbital structures unremarkable.  Major intracranial vascular structures are patent. Atherosclerotic type changes left vertebral artery suspected.  IMPRESSION: Suggestion of tiny acute  infarct right occipital lobe.  Hemorrhagic breakdown products within the right central sulcus. This may reflect result of prior trauma (not seen as acute hemorrhage on accompanying CT).  Moderate small vessel disease type changes.  Remote tiny mid centrum semiovale infarct.  Global atrophy without hydrocephalus.  Please see above.   Electronically Signed   By: Chauncey Cruel M.D.   On: 09/17/2013 12:01       Assessment and plan discussed with with attending physician and they are in agreement.    Etta Quill PA-C [facility] [phone]  09/17/2013, 4:14 PM   Assessment: 80 y.o. female with incidental punctate infarct in the right occipital lobe.  Although this is a incidental finding and not responsible for her right arm pain or weakness, given patients risk factors patient would benefit from stroke work up. Given unknown mechanism of how patient was found down and pain with PROM and AROM of right arm, may consider MRI of the right shoulder. Also possible would be an  injury to the  brachial plexus, and if symptoms persist, then would consider an EMG as an outpatient.   Stroke Risk Factors - hypertension and CAD  1. HgbA1c, fasting lipid panel 2. MRI, MRA  of the brain without contrast 3. Frequent neuro checks 4. Echocardiogram 5. Carotid dopplers 6. Prophylactic therapy-Antiplatelet med: Aspirin - dose 325mg  PO or 300mg  PR 7. Risk factor modification 8. Telemetry monitoring 9. PT consult, OT consult, Speech consult  Roland Rack, MD [facility] [phone]  If 7pm- 7am, please page neurology on call as listed in [place].

## 2013-09-17 NOTE — Progress Notes (Signed)
ANTIBIOTIC CONSULT NOTE - INITIAL  Pharmacy Consult for levofloxacin Indication: UTI  Allergies  Allergen Reactions  . Aspirin     GI  Bleed  . Penicillins     Numbness around mouth  . Biaxin [Clarithromycin] Nausea Only  . Sulfonamide Derivatives Nausea And Vomiting  . Codeine Nausea And Vomiting  . Hydrocodone Nausea And Vomiting  . Sulfa Antibiotics Nausea And Vomiting    Patient Measurements: Height: 5\' 2"  (157.5 cm) Weight: 118 lb 4.8 oz (53.661 kg) IBW/kg (Calculated) : 50.1   Vital Signs: Temp: 97.8 F (36.6 C) (06/11 1305) Temp src: Oral (06/11 1305) BP: 116/51 mmHg (06/11 1305) Pulse Rate: 73 (06/11 1305) Intake/Output from previous day: 06/10 0701 - 06/11 0700 In: 360 [P.O.:360] Out: 400 [Urine:400] Intake/Output from this shift:    Labs:  Recent Labs  09/14/13 2025 09/16/13 0540  WBC 14.2* 10.5  HGB 14.3 13.3  PLT 292 142*  CREATININE 1.26* 0.69   Estimated Creatinine Clearance: 43.6 ml/min (by C-G formula based on Cr of 0.69). No results found for this basename: VANCOTROUGH, VANCOPEAK, VANCORANDOM, [place], GENTPEAK, GENTRANDOM, TOBRATROUGH, TOBRAPEAK, TOBRARND, AMIKACINPEAK, AMIKACINTROU, AMIKACIN,  in the last 72 hours   Microbiology: Recent Results (from the past 720 hour(s))  CULTURE, BLOOD (ROUTINE X 2)     Status: None   Collection Time    09/14/13  8:25 PM      Result Value Ref Range Status   Specimen Description BLOOD ARM LEFT   Final   Special Requests BOTTLES DRAWN AEROBIC AND ANAEROBIC 5CC   Final   Culture  Setup Time     Final   Value: 09/15/2013 03:49     Performed at [facility]   Culture     Final   Value:        BLOOD CULTURE RECEIVED NO GROWTH TO DATE CULTURE WILL BE HELD FOR 5 DAYS BEFORE ISSUING A FINAL NEGATIVE REPORT     Performed at [facility]   Report Status PENDING   Incomplete  CULTURE, BLOOD (ROUTINE X 2)     Status: None   Collection Time    09/14/13  9:00 PM      Result Value Ref Range  Status   Specimen Description BLOOD RIGHT HAND   Final   Special Requests BOTTLES DRAWN AEROBIC ONLY 3CC   Final   Culture  Setup Time     Final   Value: 09/15/2013 03:49     Performed at [facility]   Culture     Final   Value:        BLOOD CULTURE RECEIVED NO GROWTH TO DATE CULTURE WILL BE HELD FOR 5 DAYS BEFORE ISSUING A FINAL NEGATIVE REPORT     Performed at [facility]   Report Status PENDING   Incomplete    Medical History: Past Medical History  Diagnosis Date  . COPD (chronic obstructive pulmonary disease)   . Carotid artery occlusion   . Arthritis   . Anxiety     Afraid , living alone  . Myocardial infarction 1987  . Anemia   . Bilateral leg pain     for years  . Femoral bruit   . Bronchitis, chronic   . Fall April 2014    Pt. has fallen twife in the last 2 months.   . Personal history of other diseases of digestive system     Upper GI bleed  . Generalized anxiety disorder   . Nonspecific abnormal electrocardiogram (  ECG) (EKG)     Abnormal Results EKG  . Memory loss     Worsening  . Intestinal infection due to Clostridium difficile   . Colitis due to Clostridium difficile   . Hypertension   . CAD (coronary artery disease)   . Thyroid disease     Hypo-Thyroidism    Assessment: 89 YOF brought in with AMS with history of accidental narcotic overdose and concern for the same. Also found to have CVA on MRI- workup underway. Dirty UA- urine sent for culture, to start treatment. WBC 10.5, slightly hypothermic. SCr 0.69, est CrCl ~40-14mL/min.  Goal of Therapy:  Eradication of infection  Plan:  1. Levofloxacin 250mg  IV Z30Q for uncomplicated UTI dosing 2. Follow c/s, renal function, LOT  Giovanni Biby D. Finnis Colee, PharmD, BCPS Clinical Pharmacist Pager: [phone] 09/17/2013 2:06 PM

## 2013-09-17 NOTE — Progress Notes (Addendum)
[facility] PROGRESS NOTE  [name] DXA:[id number] DOB: 13-Sep-1930 DOA: 09/14/2013 PCP: [name], PA-C  Assessment/Plan: 80 y/o female with PMH of COPD on home oxygen, HTN, HPL, CAD, dementia, DJD chronic pain presented with opioid+menzo intoxication; Py is found unresponsive at home by grandson. He called EMS. Patient has a history of accidental overdose of narcotic pain meds in the past and grandson states he thinks she may have again. EMS gave the patient narcan and patient went from unresponsive to alert and mostly oriented but still altered. Patient also was found to have a rectal temp of 92.5 on arrival and put in bear hugger for hypothermia -Pt initially admitted with encephalopathy due to persistent upper extremity weakness MRI obtained which showed MRI showed CVA  1. Toxic encephalopathy causing somnolence secondary to accidental narcotic and benzo overdose. encephalopathy symptoms resolving; -Discussed with daughter,  grandson bedside, she has history of same in the past to avoid overdose; Have advised grandson to inform PCP about ongoing prescription drug abuse.    2. Acute CVA; MRI: Suggestion of tiny acute infarct right occipital lobe -Pt had upper extremity weakness, which is improving; ordered CVA work up; PT eval;; SNF  -started plavix, (Pt could not tolerate ASA)called neurology eval  3. UTI, started on IV atx; monitor; pend cultures  4. Acute on chronic respiratory failure due to drug overdose on top of COPD -ABG 7.26-58-77-25 -symptoms resolved; CXR: Emphysematous changes in the lungs. No evidence of active pulmonary  Disease. -cont oxygen, prn bronchodilators,  5. Hypothyroidism. TSH 6.6. Commence home dose Synthroid once able to take by mouth.  6. Hypothermia -resolved.  D/w patient, confirmed with her daughter  Pt is DNR   Code Status: DNR Family Communication: d/w patient, her daughter  (indicate person spoken with, relationship, and if by phone, the  number) Disposition Plan: ? SNF   Consultants:  none  Procedures:  none  Antibiotics:  none (indicate start date, and stop date if known)  HPI/Subjective: alert  Objective: Filed Vitals:   09/17/13 1305  BP: 116/51  Pulse: 73  Temp: 97.8 F (36.6 C)  Resp: 18    Intake/Output Summary (Last 24 hours) at 09/17/13 1351 Last data filed at 09/17/13 0900  Gross per 24 hour  Intake      0 ml  Output    150 ml  Net   -150 ml   Filed Weights   09/14/13 2349  Weight: 53.661 kg (118 lb 4.8 oz)    Exam:   General:  alert  Cardiovascular: s1,s2 rrr  Respiratory: CTA BL  Abdomen: soft, nt,nd   Musculoskeletal: no LE edema   Data Reviewed: Basic Metabolic Panel:  Recent Labs Lab 09/14/13 2025 09/16/13 0540  NA 140 141  K 3.8 4.3  CL 96 104  CO2 28 25  GLUCOSE 141* 146*  BUN 25* 15  CREATININE 1.26* 0.69  CALCIUM 9.4 8.5   Liver Function Tests:  Recent Labs Lab 09/14/13 2025  AST 40*  ALT 21  ALKPHOS 79  BILITOT 0.4  PROT 7.3  ALBUMIN 3.9   No results found for this basename: LIPASE, AMYLASE,  in the last 168 hours  Recent Labs Lab 09/15/13 1206  AMMONIA 39   CBC:  Recent Labs Lab 09/14/13 2025 09/16/13 0540  WBC 14.2* 10.5  NEUTROABS 12.6*  --   HGB 14.3 13.3  HCT 42.8 39.1  MCV 92.8 92.2  PLT 292 142*   Cardiac Enzymes:  Recent Labs Lab 09/14/13  2242 09/16/13 0540  CKTOTAL 2880* 1631*   BNP (last 3 results) No results found for this basename: PROBNP,  in the last 8760 hours CBG:  Recent Labs Lab 09/15/13 0423  GLUCAP 99    Recent Results (from the past 240 hour(s))  CULTURE, BLOOD (ROUTINE X 2)     Status: None   Collection Time    09/14/13  8:25 PM      Result Value Ref Range Status   Specimen Description BLOOD ARM LEFT   Final   Special Requests BOTTLES DRAWN AEROBIC AND ANAEROBIC 5CC   Final   Culture  Setup Time     Final   Value: 09/15/2013 03:49     Performed at [facility]   Culture      Final   Value:        BLOOD CULTURE RECEIVED NO GROWTH TO DATE CULTURE WILL BE HELD FOR 5 DAYS BEFORE ISSUING A FINAL NEGATIVE REPORT     Performed at [facility]   Report Status PENDING   Incomplete  CULTURE, BLOOD (ROUTINE X 2)     Status: None   Collection Time    09/14/13  9:00 PM      Result Value Ref Range Status   Specimen Description BLOOD RIGHT HAND   Final   Special Requests BOTTLES DRAWN AEROBIC ONLY 3CC   Final   Culture  Setup Time     Final   Value: 09/15/2013 03:49     Performed at [facility]   Culture     Final   Value:        BLOOD CULTURE RECEIVED NO GROWTH TO DATE CULTURE WILL BE HELD FOR 5 DAYS BEFORE ISSUING A FINAL NEGATIVE REPORT     Performed at [facility]   Report Status PENDING   Incomplete     Studies: Mr Brain Wo Contrast  09/17/2013   CLINICAL DATA:  Found unresponsive by grandson.  EXAM: MRI HEAD WITHOUT CONTRAST  TECHNIQUE: Multiplanar, multiecho pulse sequences of the brain and surrounding structures were obtained without intravenous contrast.  COMPARISON:  09/14/2013 CT.  No comparison MR.  FINDINGS: Suggestion of tiny acute infarct right occipital lobe.  Hemorrhagic breakdown products within the right central sulcus. This may reflect result of prior trauma (not seen as acute hemorrhage on accompanying CT). No other areas of intracranial hemorrhage noted.  Moderate small vessel disease type changes.  Remote tiny mid centrum semiovale infarct.  Global atrophy without hydrocephalus.  No intracranial mass lesion noted on this unenhanced exam.  Transverse ligament hypertrophy. Cervical medullary junction, pituitary region, pineal region and orbital structures unremarkable.  Major intracranial vascular structures are patent. Atherosclerotic type changes left vertebral artery suspected.  IMPRESSION: Suggestion of tiny acute infarct right occipital lobe.  Hemorrhagic breakdown products within the right central sulcus. This may reflect result  of prior trauma (not seen as acute hemorrhage on accompanying CT).  Moderate small vessel disease type changes.  Remote tiny mid centrum semiovale infarct.  Global atrophy without hydrocephalus.  Please see above.   Electronically Signed   By: Chauncey Cruel M.D.   On: 09/17/2013 12:01    Scheduled Meds: . ALPRAZolam  0.5 mg Oral QHS  . budesonide-formoterol  2 puff Inhalation BID  . donepezil  10 mg Oral QHS  . felodipine  5 mg Oral Daily  . heparin  5,000 Units Subcutaneous 3 times per day  . levothyroxine  88 mcg Oral QAC breakfast  Continuous Infusions:   Active Problems:   HYPOTHYROIDISM   Accidental overdose   Hypothermia    Time spent: >35 minutes     Kinnie Feil  [facility] Pager [phone]. If 7PM-7AM, please contact night-coverage at [email or web address], password [facility] 09/17/2013, 1:51 PM  LOS: 3 days

## 2013-09-17 NOTE — Progress Notes (Signed)
09/15/13 1624  PT G-Codes **NOT FOR INPATIENT CLASS**  Functional Assessment Tool Used assist levels  Functional Limitation Mobility: Walking and moving around  Mobility: Walking and Moving Around Current Status (S3419) CM  Mobility: Walking and Moving Around Goal Status (Q2229) CJ  late entry g-codes. Barbarann Ehlers [place], [place], DPT [phone]

## 2013-09-17 NOTE — Progress Notes (Signed)
UR completed. Patient changed to inpatient- acute CVA

## 2013-09-18 DIAGNOSIS — I6340 Cerebral infarction due to embolism of unspecified cerebral artery: Secondary | ICD-10-CM | POA: Insufficient documentation

## 2013-09-18 DIAGNOSIS — I369 Nonrheumatic tricuspid valve disorder, unspecified: Secondary | ICD-10-CM

## 2013-09-18 DIAGNOSIS — N390 Urinary tract infection, site not specified: Secondary | ICD-10-CM

## 2013-09-18 MED ORDER — LEVOFLOXACIN 250 MG PO TABS
250.0000 mg | ORAL_TABLET | ORAL | Status: DC
  Administered 2013-09-18 – 2013-09-20 (×3): 250 mg via ORAL
  Filled 2013-09-18 (×4): qty 1

## 2013-09-18 MED ORDER — ONDANSETRON HCL 4 MG/2ML IJ SOLN
4.0000 mg | Freq: Three times a day (TID) | INTRAMUSCULAR | Status: DC | PRN
  Administered 2013-09-18 – 2013-09-19 (×2): 4 mg via INTRAVENOUS
  Filled 2013-09-18 (×2): qty 2

## 2013-09-18 NOTE — Progress Notes (Signed)
Physical Therapy Treatment Patient Details Name: [name] MRN: [id number] DOB: [birth year] Today's Date: 09/18/2013    History of Present Illness 80 y.o. female admitted to [facility] on 09/14/13 found down at home by her grandson with presumed accidental medication overdose (per grandson report to ED physician this has happend in the past).  Dx with Toxic encephalopathy causing somnolence secondary to accidental narcotic and benzo overdose.  Pt also with hypothyroidism and hypothermia upon admission to ED.  Pt with significant PMhx of COPD, anxiety, MI, h/o bil leg pain, fall. MRI preformed due to right arm weakness and found to have tiny acute infarct right occipital lobe.    PT Comments    Pt is progressing with her mobility today and was able to stand and take steps with a RW.  She continues to need two person assist to accomplish this safely.  I continue to recommend SNF level rehab at d/c.    Follow Up Recommendations  SNF     Equipment Recommendations  None recommended by PT    Recommendations for Other Services   NA     Precautions / Restrictions Precautions Precautions: Fall    Mobility  Bed Mobility Overal bed mobility: Needs Assistance Bed Mobility: Supine to Sit     Supine to sit: Mod assist;HOB elevated     General bed mobility comments: Mod assist to support trunk during transitions. Assist needed to help right hand reach for railing, but pt making effort with both arms and legs to get to sitting.   Transfers Overall transfer level: Needs assistance Equipment used: Rolling walker (2 wheeled) Transfers: Sit to/from Stand Sit to Stand: +2 physical assistance;Mod assist         General transfer comment: Two person mod assist to stabilize RW, hold and secure right hand on RW, and support pt's trunk during transitions over bil weak legs.   Ambulation/Gait Ambulation/Gait assistance: +2 physical assistance;Mod assist Ambulation Distance (Feet): 5  Feet Assistive device: Rolling walker (2 wheeled) Gait Pattern/deviations: Step-through pattern;Shuffle (flexed and buckling bil legs)     General Gait Details: Pt able to take steps around with two person mod assist of both right hand and her trunk to recliner chair using RW today.  Bil legs have tendancy to buckle and pt with decreased bil foot clearance.     Modified Rankin (Stroke Patients Only) Modified Rankin (Stroke Patients Only) Pre-Morbid Rankin Score: No symptoms Modified Rankin: Moderately severe disability     Balance Overall balance assessment: Needs assistance Sitting-balance support: Feet supported;Bilateral upper extremity supported Sitting balance-Leahy Scale: Poor Sitting balance - Comments: Min assist to maintain sitting balance EOB.  Postural control: Posterior lean Standing balance support: Bilateral upper extremity supported Standing balance-Leahy Scale: Poor                      Cognition Arousal/Alertness: Awake/alert Behavior During Therapy: Flat affect Overall Cognitive Status: No family/caregiver present to determine baseline cognitive functioning                             Pertinent Vitals/Pain See vitals flow sheet.        Prior Function   per pt report she wears O2 at night only.         PT Goals (current goals can now be found in the care plan section) Acute Rehab PT Goals Patient Stated Goal: to decrease nausea Progress towards PT goals: Progressing toward  goals    Frequency  Min 3X/week    PT Plan Frequency needs to be updated       End of Session Equipment Utilized During Treatment: Gait belt;Oxygen Activity Tolerance: Patient limited by fatigue;Patient limited by pain;Other (comment) (by nausea) Patient left: in chair;with call bell/phone within reach;with chair alarm set     Time: 1207-1222 PT Time Calculation (min): 15 min  Charges:  $Therapeutic Activity: 8-22 mins                       Denea Cheaney B. Harcourt, [place], DPT [phone]   09/18/2013, 4:40 PM

## 2013-09-18 NOTE — Progress Notes (Signed)
[facility] PROGRESS NOTE  [name] DVV:[id number] DOB: 06-Feb-1931 DOA: 09/14/2013 PCP: [name], PA-C  Assessment/Plan: 80 y/o female with PMH of COPD on home oxygen, HTN, HPL, CAD, dementia, DJD chronic pain presented with opioid+menzo intoxication; Py is found unresponsive at home by grandson. He called EMS. Patient has a history of accidental overdose of narcotic pain meds in the past and grandson states he thinks she may have again. EMS gave the patient narcan and patient went from unresponsive to alert and mostly oriented but still altered. Patient also was found to have a rectal temp of 92.5 on arrival and put in bear hugger for hypothermia -Pt initially admitted with encephalopathy due to persistent upper extremity weakness MRI obtained which showed MRI showed CVA  1. Toxic encephalopathy causing somnolence secondary to accidental narcotic and benzo overdose. encephalopathy symptoms resolving; -Discussed with daughter,  grandson bedside, she has history of same in the past to avoid overdose; Have advised grandson to inform PCP about ongoing prescription drug abuse.    2. Acute CVA; MRI: Suggestion of tiny acute infarct right occipital lobe -Pt had upper extremity weakness, which is improving; ordered CVA work up; PT eval;; SNF  -started plavix, (Pt could not tolerate ASA) appriciate neurology input  3. UTI, started on IV atx; monitor; pend cultures  4. Acute on chronic respiratory failure due to drug overdose on top of COPD -ABG [id number] -symptoms resolved; CXR: Emphysematous changes in the lungs. No evidence of active pulmonary  Disease. -cont oxygen, prn bronchodilators,  5. Hypothyroidism. TSH 6.6. Commence home dose Synthroid once able to take by mouth.  6. Hypothermia -resolved.  D/w patient, confirmed with her daughter  Pt is DNR   Code Status: DNR Family Communication: d/w patient, her daughter  (indicate person spoken with, relationship, and if by  phone, the number) Disposition Plan: SNF 24-48 hrs    Consultants:  none  Procedures:  none  Antibiotics:  none (indicate start date, and stop date if known)  HPI/Subjective: alert  Objective: Filed Vitals:   09/18/13 0913  BP: 128/73  Pulse: 84  Temp: 97.6 F (36.4 C)  Resp: 18   No intake or output data in the 24 hours ending 09/18/13 1030 Filed Weights   09/14/13 2349  Weight: 53.661 kg (118 lb 4.8 oz)    Exam:   General:  alert  Cardiovascular: s1,s2 rrr  Respiratory: CTA BL  Abdomen: soft, nt,nd   Musculoskeletal: no LE edema   Data Reviewed: Basic Metabolic Panel:  Recent Labs Lab 09/14/13 2025 09/16/13 0540  NA 140 141  K 3.8 4.3  CL 96 104  CO2 28 25  GLUCOSE 141* 146*  BUN 25* 15  CREATININE 1.26* 0.69  CALCIUM 9.4 8.5   Liver Function Tests:  Recent Labs Lab 09/14/13 2025  AST 40*  ALT 21  ALKPHOS 79  BILITOT 0.4  PROT 7.3  ALBUMIN 3.9   No results found for this basename: LIPASE, AMYLASE,  in the last 168 hours  Recent Labs Lab 09/15/13 1206  AMMONIA 39   CBC:  Recent Labs Lab 09/14/13 2025 09/16/13 0540  WBC 14.2* 10.5  NEUTROABS 12.6*  --   HGB 14.3 13.3  HCT 42.8 39.1  MCV 92.8 92.2  PLT 292 142*   Cardiac Enzymes:  Recent Labs Lab 09/14/13 2242 09/16/13 0540  CKTOTAL 2880* 1631*   BNP (last 3 results) No results found for this basename: PROBNP,  in the last 8760 hours CBG:  Recent  Labs Lab 09/15/13 0423  GLUCAP 99    Recent Results (from the past 240 hour(s))  CULTURE, BLOOD (ROUTINE X 2)     Status: None   Collection Time    09/14/13  8:25 PM      Result Value Ref Range Status   Specimen Description BLOOD ARM LEFT   Final   Special Requests BOTTLES DRAWN AEROBIC AND ANAEROBIC 5CC   Final   Culture  Setup Time     Final   Value: 09/15/2013 03:49     Performed at [facility]   Culture     Final   Value:        BLOOD CULTURE RECEIVED NO GROWTH TO DATE CULTURE WILL BE HELD  FOR 5 DAYS BEFORE ISSUING A FINAL NEGATIVE REPORT     Performed at [facility]   Report Status PENDING   Incomplete  CULTURE, BLOOD (ROUTINE X 2)     Status: None   Collection Time    09/14/13  9:00 PM      Result Value Ref Range Status   Specimen Description BLOOD RIGHT HAND   Final   Special Requests BOTTLES DRAWN AEROBIC ONLY 3CC   Final   Culture  Setup Time     Final   Value: 09/15/2013 03:49     Performed at [facility]   Culture     Final   Value:        BLOOD CULTURE RECEIVED NO GROWTH TO DATE CULTURE WILL BE HELD FOR 5 DAYS BEFORE ISSUING A FINAL NEGATIVE REPORT     Performed at [facility]   Report Status PENDING   Incomplete  URINE CULTURE     Status: None   Collection Time    09/17/13  3:15 AM      Result Value Ref Range Status   Specimen Description URINE, RANDOM   Final   Special Requests NONE   Final   Culture  Setup Time     Final   Value: 09/17/2013 09:56     Performed at [facility]     Final   Value: >=100,000 COLONIES/ML     Performed at [facility]   Culture     Final   Value: GRAM NEGATIVE RODS     Performed at [facility]   Report Status PENDING   Incomplete     Studies: Mr Brain Wo Contrast  09/17/2013   CLINICAL DATA:  Found unresponsive by grandson.  EXAM: MRI HEAD WITHOUT CONTRAST  TECHNIQUE: Multiplanar, multiecho pulse sequences of the brain and surrounding structures were obtained without intravenous contrast.  COMPARISON:  09/14/2013 CT.  No comparison MR.  FINDINGS: Suggestion of tiny acute infarct right occipital lobe.  Hemorrhagic breakdown products within the right central sulcus. This may reflect result of prior trauma (not seen as acute hemorrhage on accompanying CT). No other areas of intracranial hemorrhage noted.  Moderate small vessel disease type changes.  Remote tiny mid centrum semiovale infarct.  Global atrophy without hydrocephalus.  No intracranial mass lesion noted  on this unenhanced exam.  Transverse ligament hypertrophy. Cervical medullary junction, pituitary region, pineal region and orbital structures unremarkable.  Major intracranial vascular structures are patent. Atherosclerotic type changes left vertebral artery suspected.  IMPRESSION: Suggestion of tiny acute infarct right occipital lobe.  Hemorrhagic breakdown products within the right central sulcus. This may reflect result of prior trauma (not seen as acute hemorrhage on accompanying  CT).  Moderate small vessel disease type changes.  Remote tiny mid centrum semiovale infarct.  Global atrophy without hydrocephalus.  Please see above.   Electronically Signed   By: Chauncey Cruel M.D.   On: 09/17/2013 12:01    Scheduled Meds: . ALPRAZolam  0.5 mg Oral QHS  . budesonide-formoterol  2 puff Inhalation BID  . clopidogrel  75 mg Oral Q breakfast  . donepezil  10 mg Oral QHS  . felodipine  5 mg Oral Daily  . heparin  5,000 Units Subcutaneous 3 times per day  . levofloxacin (LEVAQUIN) IV  250 mg Intravenous Q24H  . levothyroxine  88 mcg Oral QAC breakfast   Continuous Infusions:   Active Problems:   HYPOTHYROIDISM   Accidental overdose   Hypothermia    Time spent: >35 minutes     Kinnie Feil  [facility] Pager [phone]. If 7PM-7AM, please contact night-coverage at [email or web address], password [facility] 09/18/2013, 10:30 AM  LOS: 4 days

## 2013-09-18 NOTE — Progress Notes (Signed)
Echocardiogram 2D Echocardiogram has been performed.  [name] 09/18/2013, 10:06 AM

## 2013-09-18 NOTE — Progress Notes (Signed)
Stroke Team Progress Note  HISTORY Chief Complaint: Stroke  HPI:  [name] is an 80 y.o. female with pmhx HLD, CAD, hypothyroidism found unresponsive at home 09/14/13 by grandson. Patient has a history of accidental overdose of narcotic pain meds in the past and grandson stated he thought she may have again. EMS gave the patient narcan and transported to ED. On arrival patient was encephalopathic and hypothermic. Due to right UE weakness, MRI brain was obtained. Results showed a punctate acute right occipital infarct. Currently patient is complaining of pain in the right UE from shoulder to hand. She is reluctant to move the arm due to pain. She states the majority of pain is in her shoulder but also in her hand. She denies any visual changes or left sided weakness or numbness.  Date last known well: Unable to determine  Time last known well: Unable to determine  Patient was not administered TPA secondary to unknown LKW. She was admitted to the Internal Medicine Service for further evaluation and treatment. Neurology is consulting.   SUBJECTIVE No acute events overnight. Pt alert and conversant, states she has fallen multiple times. Endorses taking oxycodone and Xanax. States she doesn't remember falling this time. States her R should is painful.   OBJECTIVE Most recent Vital Signs: Filed Vitals:   09/17/13 2222 09/18/13 0000 09/18/13 0159 09/18/13 0600  BP: 145/77  142/74 159/77  Pulse: 81  78 78  Temp: 97.8 F (36.6 C)  98.4 F (36.9 C) 98.2 F (36.8 C)  TempSrc: Oral  Oral Oral  Resp: 18 22 18 16   Height:      Weight:      SpO2: 98%  97% 9%   CBG (last 3)  No results found for this basename: GLUCAP,  in the last 72 hours  IV Fluid Intake:     MEDICATIONS  . ALPRAZolam  0.5 mg Oral QHS  . budesonide-formoterol  2 puff Inhalation BID  . clopidogrel  75 mg Oral Q breakfast  . donepezil  10 mg Oral QHS  . felodipine  5 mg Oral Daily  . heparin  5,000 Units Subcutaneous 3  times per day  . levofloxacin (LEVAQUIN) IV  250 mg Intravenous Q24H  . levothyroxine  88 mcg Oral QAC breakfast   PRN:  naLOXone (NARCAN)  injection, nitroGLYCERIN, sodium chloride  Diet:  General thin liquids Activity:  Up ad lib DVT Prophylaxis:  SQ heparin  CLINICALLY SIGNIFICANT STUDIES Basic Metabolic Panel:  Recent Labs Lab 09/14/13 2025 09/16/13 0540  NA 140 141  K 3.8 4.3  CL 96 104  CO2 28 25  GLUCOSE 141* 146*  BUN 25* 15  CREATININE 1.26* 0.69  CALCIUM 9.4 8.5   Liver Function Tests:  Recent Labs Lab 09/14/13 2025  AST 40*  ALT 21  ALKPHOS 79  BILITOT 0.4  PROT 7.3  ALBUMIN 3.9   CBC:  Recent Labs Lab 09/14/13 2025 09/16/13 0540  WBC 14.2* 10.5  NEUTROABS 12.6*  --   HGB 14.3 13.3  HCT 42.8 39.1  MCV 92.8 92.2  PLT 292 142*   Coagulation: No results found for this basename: LABPROT, INR,  in the last 168 hours Cardiac Enzymes:  Recent Labs Lab 09/14/13 2242 09/16/13 0540  CKTOTAL 2880* 1631*   Urinalysis:  Recent Labs Lab 09/14/13 2112 09/17/13 0316  COLORURINE YELLOW YELLOW  LABSPEC 1.019 1.011  PHURINE 5.0 6.5  GLUCOSEU NEGATIVE NEGATIVE  HGBUR LARGE* MODERATE*  BILIRUBINUR NEGATIVE NEGATIVE  KETONESUR NEGATIVE  NEGATIVE  PROTEINUR 30* NEGATIVE  UROBILINOGEN 0.2 0.2  NITRITE NEGATIVE POSITIVE*  LEUKOCYTESUR NEGATIVE LARGE*   Lipid Panel    Component Value Date/Time   CHOL 219* 09/17/2013 1525   TRIG 117 09/17/2013 1525   HDL 74 09/17/2013 1525   CHOLHDL 3.0 09/17/2013 1525   VLDL 23 09/17/2013 1525   LDLCALC 122* 09/17/2013 1525   HgbA1C  Lab Results  Component Value Date   HGBA1C 5.6 09/17/2013    Urine Drug Screen:   No results found for this basename: labopia, cocainscrnur, labbenz, amphetmu, thcu, labbarb    Alcohol Level: No results found for this basename: ETH,  in the last 168 hours  Mr Brain Wo Contrast  09/17/2013  IMPRESSION: Suggestion of tiny acute infarct right occipital lobe.  Hemorrhagic breakdown  products within the right central sulcus. This may reflect result of prior trauma (not seen as acute hemorrhage on accompanying CT).  Moderate small vessel disease type changes.  Remote tiny mid centrum semiovale infarct.  Global atrophy without hydrocephalus.      2D Echocardiogram pending  Carotid Doppler 09/18/13 Preliminary findings: Bilateral: 1-39% ICA stenosis.  Therapy Recommendations PT recs SNF  Physical Exam Gen: Patient is well developed elderly woman in no acute distress.  Cardiac: RRR. S1S2 audible. No M/R/G.  Extremities: Cap refill <2 secs. No cyanosis or edema. Pulses 2+ radial and DP. Pulmonary: Respirations regular, symmetric. Lungs clear to auscultation bilat. Abd: Soft, non-tender. BS audible x 4 quadrants.  G/U: Deferred  MS: Alert, follows commands. Oriented to person, place, time, and event. Speech: Speech fluent and non-dysarthric. Able to name and repeat. No alexia or agraphia.   CN: No visual field cut. PERRL. EOMs intact. Facial sensation intact V1-3. No facial droop. Hearing grossly intact. Strong cough. Sternocleidomastoids and trapezius 5/5 strength. Tongue midline, full strength, no atrophy or fasciculations.  Strength: 5/5 in all four extremities proximally and distally.  Sensation: Intact to light touch in all four extremities.  Coordination: No ataxia or dysmetria on FTN or HTS bilat. Gait steady.  Proprioception: Negative Romberg.   Reflexes: 2+ biceps, brachioradialis bilat. 2+ patellar, achilles bilat. No clonus. Downgoing toes bilat.  NIHSS 0   ASSESSMENT/PLAN [name] is a 80 y.o. female with prior hx of multiple narcotic overdoses who was found down at home, given Narcan by EMS, and brought to the ED on 09/14/13. MRI showed a punctate R occipital infarct felt to be embolic or secondary to small vessel ischemic disease, but incidental finding unrelated to presenting symptoms.  Not on antiplatelet prior to admission. Now on clopidogrel 75 mg  orally every day for secondary stroke prevention. Patient now at baseline neurologically. Stroke work up underway.  Stroke: punctate R occipital infarct felt to be   secondary to small vessel ischemic disease, but incidental finding unrelated to presenting symptoms  Carotid US: Prelim <39% stenosis bilat  Echo: pending  LDL 122, but found down after unknown length of time, elevated CK. Start statin when elevated CK resolves.   HgbA1C 5.6 WNL  On Plavix for antiplatelet due to adverse reaction to ASA  SQ heparin for DVT prophy  PT recs Dispo to [facility] day # 4  SIGNED Delbert Phenix, MSN, ANP-C, CNRN, MSCS [facility] Stroke Team [phone]  I have personally obtained a history, examined the patient, evaluated imaging results, and formulated the assessment and plan of care. I agree with the above. Antony Contras, MD   To contact Stroke Continuity provider,  please refer to [email or web address] After hours, contact General Neurology

## 2013-09-18 NOTE — Clinical Social Work Psychosocial (Signed)
Clinical Social Work Department BRIEF PSYCHOSOCIAL ASSESSMENT 09/18/2013  Patient:  [name], [name]     Account Number:  [id number]     Admit date:  09/14/2013  Clinical Social Worker:  Delrae Sawyers  Date/Time:  09/18/2013 02:06 PM  Referred by:  Physician  Date Referred:  09/18/2013 Referred for  SNF Placement   Other Referral:   none.   Interview type:  Patient Other interview type:   [name], [name], also present for conversation.    home: [phone]  cell: [phone]    PSYCHOSOCIAL DATA Living Status:  FAMILY Admitted from facility:   Level of care:   Primary support name:  [name] Primary support relationship to patient:  CHILD, ADULT Degree of support available:   Strong support system.    CURRENT CONCERNS Current Concerns  Post-Acute Placement   Other Concerns:   none.    SOCIAL WORK ASSESSMENT / PLAN CSW consulted for SNF placement at time of discharge. CSW met with pt and pt's daughter at bedside. Per daughter, pt and pt's family would prefer placement at [facility]. CSW provided information regarding SNF placement. CSW to continue to follow and assist with discharge planning needs.   Assessment/plan status:  Psychosocial Support/Ongoing Assessment of Needs Other assessment/ plan:   none.   Information/referral to community resources:   [facility] bed offers.    PATIENT'S/FAMILY'S RESPONSE TO PLAN OF CARE: Pt and pt's daughter understanding and agreeable to CSW plan of care. Pt and pt's daughter had no other questions or concerns at this time.       Lubertha Sayres, MSW, [facility] Licensed Clinical Social Worker [phone] and [phone]

## 2013-09-18 NOTE — Progress Notes (Signed)
*  PRELIMINARY RESULTS* Vascular Ultrasound Carotid Duplex (Doppler) has been completed.  Preliminary findings: Bilateral:  1-39% ICA stenosis.  Vertebral artery flow is antegrade.      Landry Mellow, RDMS, RVT  09/18/2013, 11:25 AM

## 2013-09-18 NOTE — Care Management Note (Unsigned)
    Page 1 of 1   09/18/2013     4:06:27 PM CARE MANAGEMENT NOTE 09/18/2013  Patient:  [name], [name]   Account Number:  [id number]  Date Initiated:  09/18/2013  Documentation initiated by:  Lorne Skeens  Subjective/Objective Assessment:   Patient admitted with CVA, UTI. Lived at home alone prior to admission.     Action/Plan:   Will follow for discharge needs pending PT/OT evals and physician orders.   Anticipated DC Date:     Anticipated DC Plan:  SKILLED NURSING FACILITY  In-house referral  Clinical Social Worker         Choice offered to / List presented to:             Status of service:  In process, will continue to follow Medicare Important Message given?  YES (If response is "NO", the following Medicare IM given date fields will be blank) Date Medicare IM given:  09/18/2013 Date Additional Medicare IM given:    Discharge Disposition:    Per UR Regulation:  Reviewed for med. necessity/level of care/duration of stay  If discussed at [place] of Stay Meetings, dates discussed:    Comments:  09/18/13 [place], MSN, CM- Met with patient to provide Medicare IM letter.

## 2013-09-18 NOTE — Clinical Social Work Placement (Addendum)
Clinical Social Work Department CLINICAL SOCIAL WORK PLACEMENT NOTE 09/18/2013  Patient:  [name], [name]  Account Number:  [id number] Admit date:  09/14/2013  Clinical Social Worker:  Delrae Sawyers  Date/time:  09/18/2013 02:23 PM  Clinical Social Work is seeking post-discharge placement for this patient at the following level of care:   SKILLED NURSING   (*CSW will update this form in Epic as items are completed)   09/18/2013  Patient/family provided with [place] Department of Clinical Social Work's list of facilities offering this level of care within the geographic area requested by the patient (or if unable, by the patient's family).  09/18/2013  Patient/family informed of their freedom to choose among providers that offer the needed level of care, that participate in Medicare, Medicaid or managed care program needed by the patient, have an available bed and are willing to accept the patient.  09/18/2013  Patient/family informed of [facility]' ownership interest in [facility], as well as of the fact that they are under no obligation to receive care at this facility.  PASARR submitted to EDS on 09/18/2013 PASARR number received on 09/18/2013  FL2 transmitted to all facilities in geographic area requested by pt/family on  09/18/2013 FL2 transmitted to all facilities within larger geographic area on   Patient informed that his/her managed care company has contracts with or will negotiate with  certain facilities, including the following:     Patient/family informed of bed offers received:  09/18/2013 Patient chooses bed at [facility] Physician recommends and patient chooses bed at    Patient to be transferred to [facility] on  09/21/2013 Patient to be transferred to facility by [facility] Patient and family notified of transfer on 09/21/2013 Name of family member notified: Marta Lamas (daughter, [phone])  The following physician request were  entered in Epic:   Additional Comments:  Lubertha Sayres, MSW, [facility] Licensed Clinical Social Worker [phone] and [phone]

## 2013-09-19 LAB — URINE CULTURE

## 2013-09-19 MED ORDER — TRAMADOL HCL 50 MG PO TABS
50.0000 mg | ORAL_TABLET | Freq: Once | ORAL | Status: AC
  Administered 2013-09-19: 50 mg via ORAL
  Filled 2013-09-19: qty 1

## 2013-09-19 MED ORDER — OXYCODONE-ACETAMINOPHEN 5-325 MG PO TABS
1.0000 | ORAL_TABLET | Freq: Three times a day (TID) | ORAL | Status: DC | PRN
Start: 2013-09-19 — End: 2013-09-21
  Administered 2013-09-19 – 2013-09-21 (×6): 1 via ORAL
  Filled 2013-09-19 (×6): qty 1

## 2013-09-19 MED ORDER — SENNOSIDES-DOCUSATE SODIUM 8.6-50 MG PO TABS
1.0000 | ORAL_TABLET | Freq: Two times a day (BID) | ORAL | Status: DC
  Administered 2013-09-19 – 2013-09-21 (×5): 1 via ORAL
  Filled 2013-09-19 (×5): qty 1

## 2013-09-19 MED ORDER — ACETAMINOPHEN 325 MG PO TABS
650.0000 mg | ORAL_TABLET | Freq: Once | ORAL | Status: AC
  Administered 2013-09-19: 650 mg via ORAL
  Filled 2013-09-19: qty 2

## 2013-09-19 MED ORDER — ONDANSETRON HCL 4 MG/2ML IJ SOLN
4.0000 mg | Freq: Four times a day (QID) | INTRAMUSCULAR | Status: DC | PRN
  Administered 2013-09-20: 4 mg via INTRAVENOUS
  Filled 2013-09-19: qty 2

## 2013-09-19 NOTE — Progress Notes (Signed)
[facility] PROGRESS NOTE  [name] WCB:[id number] DOB: 1930/08/30 DOA: 09/14/2013 PCP: [name], PA-C  Assessment/Plan: 80 y/o female with PMH of COPD on home oxygen, HTN, HPL, CAD, dementia, DJD chronic pain presented with opioid+menzo intoxication; Py is found unresponsive at home by grandson. He called EMS. Patient has a history of accidental overdose of narcotic pain meds in the past and grandson states he thinks she may have again. EMS gave the patient narcan and patient went from unresponsive to alert and mostly oriented but still altered. Patient also was found to have a rectal temp of 92.5 on arrival and put in bear hugger for hypothermia -Pt initially admitted with encephalopathy due to persistent upper extremity weakness MRI obtained which showed MRI showed CVA  1. Toxic encephalopathy causing somnolence secondary to accidental narcotic and benzo overdose. encephalopathy symptoms resolving; -Discussed with daughter,  grandson bedside, she has history of same in the past to avoid overdose; Have advised grandson to inform PCP about ongoing prescription drug abuse.    2. Acute CVA; MRI: Suggestion of tiny acute infarct right occipital lobe -Pt had upper extremity weakness, which is improving; ordered CVA work up; PT eval;; SNF  -started plavix, (Pt could not tolerate ASA) appriciate neurology input  3. UTI, started on IV atx; monitor; pend cultures, GNR  4. Acute on chronic respiratory failure due to drug overdose on top of COPD -ABG 7.26-58-77-25 -symptoms resolved; CXR: Emphysematous changes in the lungs. No evidence of active pulmonary  Disease. -cont oxygen, prn bronchodilators,  5. Hypothyroidism. TSH 6.6. Commence home dose Synthroid once able to take by mouth.  6. Hypothermia -resolved. 7. Chronic pain, due to overdose, will start as needed percocet, monitor   D/w patient, confirmed with her daughter  Pt is DNR   Code Status: DNR Family Communication: d/w  patient, her daughter  (indicate person spoken with, relationship, and if by phone, the number) Disposition Plan: SNF when bed is available    Consultants:  none  Procedures:  none  Antibiotics:  none (indicate start date, and stop date if known)  HPI/Subjective: alert  Objective: Filed Vitals:   09/19/13 1018  BP: 154/54  Pulse: 67  Temp: 97.6 F (36.4 C)  Resp: 16   No intake or output data in the 24 hours ending 09/19/13 1125 Filed Weights   09/14/13 2349  Weight: 53.661 kg (118 lb 4.8 oz)    Exam:   General:  alert  Cardiovascular: s1,s2 rrr  Respiratory: CTA BL  Abdomen: soft, nt,nd   Musculoskeletal: no LE edema   Data Reviewed: Basic Metabolic Panel:  Recent Labs Lab 09/14/13 2025 09/16/13 0540  NA 140 141  K 3.8 4.3  CL 96 104  CO2 28 25  GLUCOSE 141* 146*  BUN 25* 15  CREATININE 1.26* 0.69  CALCIUM 9.4 8.5   Liver Function Tests:  Recent Labs Lab 09/14/13 2025  AST 40*  ALT 21  ALKPHOS 79  BILITOT 0.4  PROT 7.3  ALBUMIN 3.9   No results found for this basename: LIPASE, AMYLASE,  in the last 168 hours  Recent Labs Lab 09/15/13 1206  AMMONIA 39   CBC:  Recent Labs Lab 09/14/13 2025 09/16/13 0540  WBC 14.2* 10.5  NEUTROABS 12.6*  --   HGB 14.3 13.3  HCT 42.8 39.1  MCV 92.8 92.2  PLT 292 142*   Cardiac Enzymes:  Recent Labs Lab 09/14/13 2242 09/16/13 0540  CKTOTAL 2880* 1631*   BNP (last 3 results)  No results found for this basename: PROBNP,  in the last 8760 hours CBG:  Recent Labs Lab 09/15/13 0423  GLUCAP 99    Recent Results (from the past 240 hour(s))  CULTURE, BLOOD (ROUTINE X 2)     Status: None   Collection Time    09/14/13  8:25 PM      Result Value Ref Range Status   Specimen Description BLOOD ARM LEFT   Final   Special Requests BOTTLES DRAWN AEROBIC AND ANAEROBIC 5CC   Final   Culture  Setup Time     Final   Value: 09/15/2013 03:49     Performed at [facility]   Culture      Final   Value:        BLOOD CULTURE RECEIVED NO GROWTH TO DATE CULTURE WILL BE HELD FOR 5 DAYS BEFORE ISSUING A FINAL NEGATIVE REPORT     Performed at [facility]   Report Status PENDING   Incomplete  CULTURE, BLOOD (ROUTINE X 2)     Status: None   Collection Time    09/14/13  9:00 PM      Result Value Ref Range Status   Specimen Description BLOOD RIGHT HAND   Final   Special Requests BOTTLES DRAWN AEROBIC ONLY 3CC   Final   Culture  Setup Time     Final   Value: 09/15/2013 03:49     Performed at [facility]   Culture     Final   Value:        BLOOD CULTURE RECEIVED NO GROWTH TO DATE CULTURE WILL BE HELD FOR 5 DAYS BEFORE ISSUING A FINAL NEGATIVE REPORT     Performed at [facility]   Report Status PENDING   Incomplete  URINE CULTURE     Status: None   Collection Time    09/17/13  3:15 AM      Result Value Ref Range Status   Specimen Description URINE, RANDOM   Final   Special Requests NONE   Final   Culture  Setup Time     Final   Value: 09/17/2013 09:56     Performed at [place]     Final   Value: >=100,000 COLONIES/ML     Performed at [facility]   Culture     Final   Value: [place]     Performed at [facility]   Report Status PENDING   Incomplete     Studies: No results found.  Scheduled Meds: . ALPRAZolam  0.5 mg Oral QHS  . budesonide-formoterol  2 puff Inhalation BID  . clopidogrel  75 mg Oral Q breakfast  . donepezil  10 mg Oral QHS  . felodipine  5 mg Oral Daily  . heparin  5,000 Units Subcutaneous 3 times per day  . levofloxacin  250 mg Oral Q24H  . levothyroxine  88 mcg Oral QAC breakfast   Continuous Infusions:   Active Problems:   HYPOTHYROIDISM   Accidental overdose   Hypothermia   Cerebral embolism with cerebral infarction    Time spent: >35 minutes     Kinnie Feil  [facility] Pager [phone]. If 7PM-7AM, please contact night-coverage at  [email or web address], password [facility] 09/19/2013, 11:25 AM  LOS: 5 days

## 2013-09-19 NOTE — Progress Notes (Signed)
Stroke Team Progress Note  HISTORY Chief Complaint: Stroke  HPI:  [name] is an 80 y.o. female with pmhx HLD, CAD, hypothyroidism found unresponsive at home 09/14/13 by grandson. Patient has a history of accidental overdose of narcotic pain meds in the past and grandson stated he thought she may have again. EMS gave the patient narcan and transported to ED. On arrival patient was encephalopathic and hypothermic. Due to right UE weakness, MRI brain was obtained. Results showed a punctate acute right occipital infarct. Currently patient is complaining of pain in the right UE from shoulder to hand. She is reluctant to move the arm due to pain. She states the majority of pain is in her shoulder but also in her hand. She denies any visual changes or left sided weakness or numbness.  Date last known well: Unable to determine  Time last known well: Unable to determine  Patient was not administered TPA secondary to unknown LKW. She was admitted to the Internal Medicine Service for further evaluation and treatment. Neurology is consulting.   SUBJECTIVE No acute events overnight. Pt alert and conversant,   . States her R should is painful but improving.   OBJECTIVE Most recent Vital Signs: Filed Vitals:   09/19/13 0235 09/19/13 0527 09/19/13 0853 09/19/13 1018  BP: 148/69 156/78  154/54  Pulse: 69 56  67  Temp: 97.8 F (36.6 C) 97.5 F (36.4 C)  97.6 F (36.4 C)  TempSrc: Oral Oral  Oral  Resp: 16 16  16   Height:      Weight:      SpO2: 95% 96% 97% 91%   CBG (last 3)  No results found for this basename: GLUCAP,  in the last 72 hours  IV Fluid Intake:     MEDICATIONS  . ALPRAZolam  0.5 mg Oral QHS  . budesonide-formoterol  2 puff Inhalation BID  . clopidogrel  75 mg Oral Q breakfast  . donepezil  10 mg Oral QHS  . felodipine  5 mg Oral Daily  . heparin  5,000 Units Subcutaneous 3 times per day  . levofloxacin  250 mg Oral Q24H  . levothyroxine  88 mcg Oral QAC breakfast   PRN:   naLOXone (NARCAN)  injection, nitroGLYCERIN, ondansetron (ZOFRAN) IV, sodium chloride  Diet:  General thin liquids Activity:  Up ad lib DVT Prophylaxis:  SQ heparin  CLINICALLY SIGNIFICANT STUDIES Basic Metabolic Panel:   Recent Labs Lab 09/14/13 2025 09/16/13 0540  NA 140 141  K 3.8 4.3  CL 96 104  CO2 28 25  GLUCOSE 141* 146*  BUN 25* 15  CREATININE 1.26* 0.69  CALCIUM 9.4 8.5   Liver Function Tests:   Recent Labs Lab 09/14/13 2025  AST 40*  ALT 21  ALKPHOS 79  BILITOT 0.4  PROT 7.3  ALBUMIN 3.9   CBC:   Recent Labs Lab 09/14/13 2025 09/16/13 0540  WBC 14.2* 10.5  NEUTROABS 12.6*  --   HGB 14.3 13.3  HCT 42.8 39.1  MCV 92.8 92.2  PLT 292 142*   Coagulation: No results found for this basename: LABPROT, INR,  in the last 168 hours Cardiac Enzymes:   Recent Labs Lab 09/14/13 2242 09/16/13 0540  CKTOTAL 2880* 1631*   Urinalysis:   Recent Labs Lab 09/14/13 2112 09/17/13 0316  COLORURINE YELLOW YELLOW  LABSPEC 1.019 1.011  PHURINE 5.0 6.5  GLUCOSEU NEGATIVE NEGATIVE  HGBUR LARGE* MODERATE*  BILIRUBINUR NEGATIVE NEGATIVE  KETONESUR NEGATIVE NEGATIVE  PROTEINUR 30* NEGATIVE  UROBILINOGEN  0.2 0.2  NITRITE NEGATIVE POSITIVE*  LEUKOCYTESUR NEGATIVE LARGE*   Lipid Panel    Component Value Date/Time   CHOL 219* 09/17/2013 1525   TRIG 117 09/17/2013 1525   HDL 74 09/17/2013 1525   CHOLHDL 3.0 09/17/2013 1525   VLDL 23 09/17/2013 1525   LDLCALC 122* 09/17/2013 1525   HgbA1C  Lab Results  Component Value Date   HGBA1C 5.6 09/17/2013    Urine Drug Screen:   No results found for this basename: labopia,  cocainscrnur,  labbenz,  amphetmu,  thcu,  labbarb    Alcohol Level: No results found for this basename: ETH,  in the last 168 hours  Mr Brain Wo Contrast  09/17/2013  IMPRESSION: Suggestion of tiny acute infarct right occipital lobe.  Hemorrhagic breakdown products within the right central sulcus. This may reflect result of prior trauma (not  seen as acute hemorrhage on accompanying CT).  Moderate small vessel disease type changes.  Remote tiny mid centrum semiovale infarct.  Global atrophy without hydrocephalus.      2D Echocardiogram : EF 60-65 % no cardiac source of embolism Carotid Doppler 09/18/13 Preliminary findings: Bilateral: 1-39% ICA stenosis.  Therapy Recommendations PT recs SNF  Physical Exam Gen: Patient is well developed elderly woman in no acute distress.  Cardiac: RRR. S1S2 audible. No M/R/G.  Extremities: Cap refill <2 secs. No cyanosis or edema. Pulses 2+ radial and DP. Pulmonary: Respirations regular, symmetric. Lungs clear to auscultation bilat. Abd: Soft, non-tender. BS audible x 4 quadrants.  G/U: Deferred  MS: Alert, follows commands. Oriented to person, place, time, and event. Speech: Speech fluent and non-dysarthric. Able to name and repeat. No alexia or agraphia.   CN: No visual field cut. PERRL. EOMs intact. Facial sensation intact V1-3. No facial droop. Hearing grossly intact. Strong cough. Sternocleidomastoids and trapezius 5/5 strength. Tongue midline, full strength, no atrophy or fasciculations.  Strength: 5/5 in all four extremities proximally and distally.  Sensation: Intact to light touch in all four extremities.  Coordination: No ataxia or dysmetria on FTN or HTS bilat. Gait steady.  Proprioception: Negative Romberg.   Reflexes: 2+ biceps, brachioradialis bilat. 2+ patellar, achilles bilat. No clonus. Downgoing toes bilat.  NIHSS 0   ASSESSMENT/PLAN Ms. [name] is a 80 y.o. female with prior hx of multiple narcotic overdoses who was found down at home, given Narcan by EMS, and brought to the ED on 09/14/13. MRI showed a punctate R occipital infarct felt to be embolic or secondary to small vessel ischemic disease, but incidental finding unrelated to presenting symptoms.  Not on antiplatelet prior to admission. Now on clopidogrel 75 mg orally every day for secondary stroke prevention.  Patient now at baseline neurologically. Stroke work up underway.  Stroke: punctate R occipital infarct felt to be   secondary to small vessel ischemic disease, but incidental finding unrelated to presenting symptoms  Carotid US: Prelim <39% stenosis bilat  Echo: normal EF  LDL 122, but found down after unknown length of time, elevated CK. Start statin when elevated CK resolves.   HgbA1C 5.6 WNL  On Plavix for antiplatelet due to adverse reaction to ASA  SQ heparin for DVT prophy  PT recs Dispo to SNF  Stroke service will sign off. Call for questions.   F/u Dr Erlinda Hong stroke clinic in 4 weeks   Hospital day # 5  SIGNED  Antony Contras, MD   To contact Stroke Continuity provider, please refer to [email or web address] After hours, contact General Neurology

## 2013-09-20 MED ORDER — BISACODYL 10 MG RE SUPP: 10.0000 mg | Freq: Every day | RECTAL | Status: DC | PRN

## 2013-09-20 MED ORDER — LEVOTHYROXINE SODIUM 100 MCG PO TABS
100.0000 ug | ORAL_TABLET | Freq: Every day | ORAL | Status: DC
  Administered 2013-09-21: 100 ug via ORAL
  Filled 2013-09-20 (×2): qty 1

## 2013-09-20 NOTE — Progress Notes (Signed)
[facility] PROGRESS NOTE  [name] IRW:[id number] DOB: [birth year] DOA: 09/14/2013 PCP: [name], PA-C  Assessment/Plan: 80 y/o female with PMH of COPD on home oxygen, HTN, HPL, CAD, dementia, DJD chronic pain presented with opioid+benzo intoxication; Py is found unresponsive at home by grandson. He called EMS. Patient has a history of accidental overdose of narcotic pain meds in the past and grandson states he thinks she may have again. EMS gave the patient narcan and patient went from unresponsive to alert and mostly oriented but still altered. Patient also was found to have a rectal temp of 92.5 on arrival and put in bear hugger for hypothermia -Pt initially admitted with encephalopathy due to persistent upper extremity weakness MRI obtained which showed MRI showed CVA  1. Toxic encephalopathy causing somnolence secondary to accidental narcotic and benzo overdose. encephalopathy symptoms resolving; -Discussed with daughter,  grandson bedside, she has history of same in the past to avoid overdose; Have advised grandson to inform PCP about ongoing prescription drug abuse.    2. Acute CVA; MRI: Suggestion of tiny acute infarct right occipital lobe -Pt had upper extremity weakness, which is improving; ordered CVA work up; PT eval;; SNF  -started plavix, (Pt could not tolerate ASA) appriciate neurology input  3. UTI, started on atx; cultures, GNR; afebrile, improving; d/c atx on 6/15  4. Acute on chronic respiratory failure due to drug overdose on top of COPD -ABG 7.26-58-77-25 -symptoms resolved; CXR: Emphysematous changes in the lungs. No evidence of active pulmonary  Disease. -cont oxygen, prn bronchodilators,  5. Hypothyroidism. TSH 6.6. Commence home dose Synthroid once able to take by mouth.  6. Hypothermia -resolved. 7. Chronic pain, due to overdose, started as needed percocet, monitor   D/w patient, confirmed with her daughter  Pt is DNR   Code Status: DNR Family  Communication: d/w patient, her daughter, son at [phone]   (indicate person spoken with, relationship, and if by phone, the number) Disposition Plan: SNF when bed is available    Consultants:  none  Procedures:  none  Antibiotics:  levofloxacin (indicate start date, and stop date if known)  HPI/Subjective: alert  Objective: Filed Vitals:   09/20/13 0647  BP: 152/66  Pulse: 69  Temp: 97.8 F (36.6 C)  Resp: 18    Intake/Output Summary (Last 24 hours) at 09/20/13 0936 Last data filed at 09/19/13 1700  Gross per 24 hour  Intake    290 ml  Output      0 ml  Net    290 ml   Filed Weights   09/14/13 2349  Weight: 53.661 kg (118 lb 4.8 oz)    Exam:   General:  alert  Cardiovascular: s1,s2 rrr  Respiratory: CTA BL  Abdomen: soft, nt,nd   Musculoskeletal: no LE edema   Data Reviewed: Basic Metabolic Panel:  Recent Labs Lab 09/14/13 2025 09/16/13 0540  NA 140 141  K 3.8 4.3  CL 96 104  CO2 28 25  GLUCOSE 141* 146*  BUN 25* 15  CREATININE 1.26* 0.69  CALCIUM 9.4 8.5   Liver Function Tests:  Recent Labs Lab 09/14/13 2025  AST 40*  ALT 21  ALKPHOS 79  BILITOT 0.4  PROT 7.3  ALBUMIN 3.9   No results found for this basename: LIPASE, AMYLASE,  in the last 168 hours  Recent Labs Lab 09/15/13 1206  AMMONIA 39   CBC:  Recent Labs Lab 09/14/13 2025 09/16/13 0540  WBC 14.2* 10.5  NEUTROABS 12.6*  --  HGB 14.3 13.3  HCT 42.8 39.1  MCV 92.8 92.2  PLT 292 142*   Cardiac Enzymes:  Recent Labs Lab 09/14/13 2242 09/16/13 0540  CKTOTAL 2880* 1631*   BNP (last 3 results) No results found for this basename: PROBNP,  in the last 8760 hours CBG:  Recent Labs Lab 09/15/13 0423  GLUCAP 99    Recent Results (from the past 240 hour(s))  CULTURE, BLOOD (ROUTINE X 2)     Status: None   Collection Time    09/14/13  8:25 PM      Result Value Ref Range Status   Specimen Description BLOOD ARM LEFT   Final   Special Requests  BOTTLES DRAWN AEROBIC AND ANAEROBIC 5CC   Final   Culture  Setup Time     Final   Value: 09/15/2013 03:49     Performed at [facility]   Culture     Final   Value:        BLOOD CULTURE RECEIVED NO GROWTH TO DATE CULTURE WILL BE HELD FOR 5 DAYS BEFORE ISSUING A FINAL NEGATIVE REPORT     Performed at [facility]   Report Status PENDING   Incomplete  CULTURE, BLOOD (ROUTINE X 2)     Status: None   Collection Time    09/14/13  9:00 PM      Result Value Ref Range Status   Specimen Description BLOOD RIGHT HAND   Final   Special Requests BOTTLES DRAWN AEROBIC ONLY 3CC   Final   Culture  Setup Time     Final   Value: 09/15/2013 03:49     Performed at [facility]   Culture     Final   Value:        BLOOD CULTURE RECEIVED NO GROWTH TO DATE CULTURE WILL BE HELD FOR 5 DAYS BEFORE ISSUING A FINAL NEGATIVE REPORT     Performed at [facility]   Report Status PENDING   Incomplete  URINE CULTURE     Status: None   Collection Time    09/17/13  3:15 AM      Result Value Ref Range Status   Specimen Description URINE, RANDOM   Final   Special Requests NONE   Final   Culture  Setup Time     Final   Value: 09/17/2013 09:56     Performed at [facility]     Final   Value: >=100,000 COLONIES/ML     Performed at [facility]   Culture     Final   Value: ESCHERICHIA COLI     Performed at [facility]   Report Status 09/19/2013 FINAL   Final   Organism ID, Bacteria ESCHERICHIA COLI   Final     Studies: No results found.  Scheduled Meds: . ALPRAZolam  0.5 mg Oral QHS  . budesonide-formoterol  2 puff Inhalation BID  . clopidogrel  75 mg Oral Q breakfast  . donepezil  10 mg Oral QHS  . felodipine  5 mg Oral Daily  . heparin  5,000 Units Subcutaneous 3 times per day  . levofloxacin  250 mg Oral Q24H  . levothyroxine  88 mcg Oral QAC breakfast  . senna-docusate  1 tablet Oral BID   Continuous Infusions:   Active  Problems:   HYPOTHYROIDISM   Accidental overdose   Hypothermia   Cerebral embolism with cerebral infarction    Time spent: >35 minutes  Kinnie Feil  [facility] Pager [phone]. If 7PM-7AM, please contact night-coverage at [email or web address], password [facility] 09/20/2013, 9:36 AM  LOS: 6 days

## 2013-09-21 LAB — CBC
HCT: 39.5 % (ref 36.0–46.0)
Hemoglobin: 13.2 g/dL (ref 12.0–15.0)
MCH: 30 pg (ref 26.0–34.0)
MCHC: 33.4 g/dL (ref 30.0–36.0)
MCV: 89.8 fL (ref 78.0–100.0)
PLATELETS: 314 10*3/uL (ref 150–400)
RBC: 4.4 MIL/uL (ref 3.87–5.11)
RDW: 13 % (ref 11.5–15.5)
WBC: 7.9 10*3/uL (ref 4.0–10.5)

## 2013-09-21 LAB — CULTURE, BLOOD (ROUTINE X 2)
CULTURE: NO GROWTH
CULTURE: NO GROWTH

## 2013-09-21 MED ORDER — ALPRAZOLAM 0.5 MG PO TABS: 0.5000 mg | ORAL_TABLET | Freq: Every day | ORAL | Status: DC

## 2013-09-21 MED ORDER — CLOPIDOGREL BISULFATE 75 MG PO TABS
75.0000 mg | ORAL_TABLET | Freq: Every day | ORAL | Status: DC
Start: 2013-09-21 — End: 2015-05-31

## 2013-09-21 MED ORDER — OXYCODONE-ACETAMINOPHEN 5-325 MG PO TABS: 1.0000 | ORAL_TABLET | Freq: Four times a day (QID) | ORAL | Status: DC | PRN

## 2013-09-21 MED ORDER — SENNOSIDES-DOCUSATE SODIUM 8.6-50 MG PO TABS: 1.0000 | ORAL_TABLET | Freq: Two times a day (BID) | ORAL | Status: DC

## 2013-09-21 NOTE — Clinical Social Work Note (Addendum)
Discharge summary has been faxed to [facility]. CSW updated pt's daughter, Marta Lamas, regarding discharge. [name] to meet pt at facility once discharged. Discharge packet has been completed and placed on pt's shadow chart. Pt's family has requested ambulance transportation via EMS ([facility]). CSW has arranged for 1pm EMS([facility]) pick-up, RN notified and aware.  RN to please call for report at [phone]  RN to please call pt's daughter, Marta Lamas [[phone]], once EMS has arrived as pt's daughter is at work and will leave work once pt is en route to SNF.  Thanks,  Lubertha Sayres, MSW, [facility] Licensed Clinical Social Worker [phone] and [phone]

## 2013-09-21 NOTE — Discharge Summary (Signed)
Physician Discharge Summary  Vanita Cannell Venable PPJ:[id number] DOB: Jul 05, 1930 DOA: 09/14/2013  PCP: [name], PA-C  Admit date: 09/14/2013 Discharge date: 09/21/2013  Time spent: >35 minutes  Recommendations for Outpatient Follow-up:  SNF F/u with PCP in 1-2 week  F/u with neurologist in 4 week  Discharge Diagnoses:  Active Problems:   HYPOTHYROIDISM   Accidental overdose   Hypothermia   Cerebral embolism with cerebral infarction   Discharge Condition: stable   Diet recommendation: low sodium  Filed Weights   09/14/13 2349  Weight: 53.661 kg (118 lb 4.8 oz)    History of present illness:  80 y/o female with PMH of COPD on home oxygen, HTN, HPL, CAD, dementia, DJD chronic pain presented with opioid+benzo intoxication; Py is found unresponsive at home by grandson. He called EMS. Patient has a history of accidental overdose of narcotic pain meds in the past and grandson states he thinks she may have again. EMS gave the patient narcan and patient went from unresponsive to alert and mostly oriented but still altered. Patient also was found to have a rectal temp of 92.5 on arrival and put in bear hugger for hypothermia  -Pt initially admitted with encephalopathy due to persistent upper extremity weakness MRI obtained which showed MRI showed CVA   Hospital Course:  1. Toxic encephalopathy causing somnolence secondary to accidental narcotic and benzo overdose. encephalopathy symptoms resolved;  -Discussed with daughter, grandson bedside, she has history of same in the past to avoid overdose; Have advised grandson to inform PCP about ongoing prescription drug abuse.  2. Acute CVA; MRI: Suggestion of tiny acute infarct right occipital lobe  -Pt had upper extremity weakness, which is improving; ordered CVA work up; PT eval;; SNF  -started plavix, (Pt could not tolerate ASA) appriciate neurology input  3. UTI, completed atx; afebrile, 4. Acute on chronic respiratory failure due to drug  overdose on top of COPD  -ABG 7.26-58-77-25  -symptoms resolved; CXR: Emphysematous changes in the lungs. No evidence of active pulmonary Disease.  -cont oxygen, prn bronchodilators,  5. Hypothyroidism. TSH 6.6. Commence home dose Synthroid once able to take by mouth.  6. Hypothermia -resolved.  7. Chronic pain, due to overdose, started as needed percocet, monitor   D/w patient, confirmed with her daughter     Procedures:  echo  Study Conclusions  - Left ventricle: The cavity size was normal. Wall thickness was increased in a pattern of mild LVH. Systolic function was normal. The estimated ejection fraction was in the range of 60% to 65%. Wall motion was normal; there were no regional wall motion abnormalities. Doppler parameters are consistent with abnormal left ventricular relaxation (grade 1 diastolic dysfunction). - Atrial septum: No defect or patent foramen ovale was identified. - Pericardium, extracardiac: Prominant epicardial fat.  Carotids: Summary:  - The vertebral arteries appear patent with antegrade flow. - Findings consistent with 1- 39 percent stenosis involving the right internal carotid artery and the left internal carotid artery.    (i.e. Studies not automatically included, echos, thoracentesis, etc; not x-rays)  Consultations:  Neurology   Discharge Exam: Filed Vitals:   09/21/13 1016  BP: 164/74  Pulse: 60  Temp: 98.1 F (36.7 C)  Resp: 18    General: alert Cardiovascular: s1,s2 rrr Respiratory: CTA BL  Discharge Instructions  Discharge Instructions   Diet - low sodium heart healthy    Complete by:  As directed      Discharge instructions    Complete by:  As directed  Please follow up with primary care doctor in 1 week     Increase activity slowly    Complete by:  As directed             Medication List    STOP taking these medications       diphenhydramine-acetaminophen 25-500 MG Tabs  Commonly known as:  TYLENOL PM      oxyCODONE 10 MG 12 hr tablet  Commonly known as:  OXYCONTIN     sertraline 25 MG tablet  Commonly known as:  ZOLOFT      TAKE these medications       ALPRAZolam 0.5 MG tablet  Commonly known as:  XANAX  Take 1 tablet (0.5 mg total) by mouth at bedtime.     AMITIZA 24 MCG capsule  Generic drug:  lubiprostone  Take 24 mcg by mouth daily.     budesonide-formoterol 160-4.5 MCG/ACT inhaler  Commonly known as:  SYMBICORT  Inhale 2 puffs into the lungs 2 (two) times daily as needed (for shortness of breath).     citalopram 20 MG tablet  Commonly known as:  CELEXA  Take 20 mg by mouth daily.     clopidogrel 75 MG tablet  Commonly known as:  PLAVIX  Take 1 tablet (75 mg total) by mouth daily with breakfast.     donepezil 10 MG tablet  Commonly known as:  ARICEPT  Take 10 mg by mouth at bedtime.     felodipine 5 MG 24 hr tablet  Commonly known as:  PLENDIL  Take 5 mg by mouth daily.     levothyroxine 100 MCG tablet  Commonly known as:  SYNTHROID, LEVOTHROID  Take 100 mcg by mouth daily before breakfast.     nitroGLYCERIN 0.4 MG SL tablet  Commonly known as:  NITROSTAT  Place 0.4 mg under the tongue every 5 (five) minutes as needed for chest pain.     oxyCODONE-acetaminophen 5-325 MG per tablet  Commonly known as:  PERCOCET/ROXICET  Take 1 tablet by mouth every 6 (six) hours as needed for severe pain.     senna-docusate 8.6-50 MG per tablet  Commonly known as:  Senokot-S  Take 1 tablet by mouth 2 (two) times daily.       Allergies  Allergen Reactions  . Aspirin     GI  Bleed  . Penicillins     Numbness around mouth  . Biaxin [Clarithromycin] Nausea Only  . Sulfonamide Derivatives Nausea And Vomiting  . Codeine Nausea And Vomiting  . Hydrocodone Nausea And Vomiting  . Sulfa Antibiotics Nausea And Vomiting       Follow-up Information   Follow up with GRAY, ERIN J, PA-C In 1 week.   Specialty:  Family Medicine   Contact information:   [address] [place]  [address] [phone]        The results of significant diagnostics from this hospitalization (including imaging, microbiology, ancillary and laboratory) are listed [place] for reference.    Significant Diagnostic Studies: Dg Chest 1 View  09/14/2013   CLINICAL DATA:  Found down. Unconscious. Left shoulder appeared painful.  EXAM: CHEST - 1 VIEW  COMPARISON:  10/12/2009  FINDINGS: Normal heart size and pulmonary vascularity. Probable emphysematous changes in the upper lungs. No focal airspace disease or consolidation. Degenerative changes in the shoulders, greater on the left. No blunting of costophrenic angles. No pneumothorax. Calcification of the aorta. Degenerative changes in the spine.  IMPRESSION: Emphysematous changes in the lungs. No  evidence of active pulmonary disease.   Electronically Signed   By: Lucienne Capers M.D.   On: 09/14/2013 21:59   Dg Clavicle Left  09/14/2013   CLINICAL DATA:  [place], loss of consciousness, LEFT shoulder pain  EXAM: LEFT CLAVICLE - 2+ VIEWS  COMPARISON:  None  FINDINGS: AP internal external views.  Osseous demineralization.  Glenohumeral joint space narrowing.  AC joint alignment normal.  No acute fracture, dislocation or bone destruction.  Atherosclerotic calcification aortic arch.  Visualized LEFT ribs unremarkable.  IMPRESSION: Osseous demineralization with minimal degenerative changes of the LEFT glenohumeral joint.  No definite acute osseous findings.   Electronically Signed   By: Lavonia Dana M.D.   On: 09/14/2013 22:00   Ct Head Wo Contrast  09/14/2013   CLINICAL DATA:  Altered mental status. Evaluate for head or cervical spine injury.  EXAM: CT HEAD WITHOUT CONTRAST  CT CERVICAL SPINE WITHOUT CONTRAST  TECHNIQUE: Multidetector CT imaging of the head and cervical spine was performed following the standard protocol without intravenous contrast. Multiplanar CT image reconstructions of the cervical spine were also generated.   COMPARISON:  CT head 07/14/2016.  FINDINGS: CT HEAD FINDINGS  No evidence for acute infarction, hemorrhage, mass lesion, hydrocephalus, or extra-axial fluid. Generalized atrophy. Chronic microvascular ischemic change. Remote basal ganglia lacunar infarct on the right is stable. Calvarium intact. Vascular calcification. No sinus or mastoid air-fluid levels. Similar appearance to priors.  CT CERVICAL SPINE FINDINGS  There is no visible cervical spine fracture, traumatic subluxation, prevertebral soft tissue swelling, or intraspinal hematoma. Advanced spondylosis with disc space narrowing. Mild degenerative/facet mediated slip C3-4 and C4-5, as well as C7-T1. Mild central canal stenosis most prominent C3-C4. Carotid atherosclerosis. Changes of COPD with biapical scarring. No worrisome osseous lesions. No neck masses.  IMPRESSION: Atrophy and small vessel disease.  No acute intracranial findings.  Cervical spine degenerative change without acute findings.   Electronically Signed   By: Rolla Flatten M.D.   On: 09/14/2013 22:24   Ct Cervical Spine Wo Contrast  09/14/2013   CLINICAL DATA:  Altered mental status. Evaluate for head or cervical spine injury.  EXAM: CT HEAD WITHOUT CONTRAST  CT CERVICAL SPINE WITHOUT CONTRAST  TECHNIQUE: Multidetector CT imaging of the head and cervical spine was performed following the standard protocol without intravenous contrast. Multiplanar CT image reconstructions of the cervical spine were also generated.  COMPARISON:  CT head 07/14/2016.  FINDINGS: CT HEAD FINDINGS  No evidence for acute infarction, hemorrhage, mass lesion, hydrocephalus, or extra-axial fluid. Generalized atrophy. Chronic microvascular ischemic change. Remote basal ganglia lacunar infarct on the right is stable. Calvarium intact. Vascular calcification. No sinus or mastoid air-fluid levels. Similar appearance to priors.  CT CERVICAL SPINE FINDINGS  There is no visible cervical spine fracture, traumatic subluxation,  prevertebral soft tissue swelling, or intraspinal hematoma. Advanced spondylosis with disc space narrowing. Mild degenerative/facet mediated slip C3-4 and C4-5, as well as C7-T1. Mild central canal stenosis most prominent C3-C4. Carotid atherosclerosis. Changes of COPD with biapical scarring. No worrisome osseous lesions. No neck masses.  IMPRESSION: Atrophy and small vessel disease.  No acute intracranial findings.  Cervical spine degenerative change without acute findings.   Electronically Signed   By: Rolla Flatten M.D.   On: 09/14/2013 22:24   Mr Brain Wo Contrast  09/17/2013   CLINICAL DATA:  Found unresponsive by grandson.  EXAM: MRI HEAD WITHOUT CONTRAST  TECHNIQUE: Multiplanar, multiecho pulse sequences of the brain and surrounding structures were obtained without intravenous contrast.  COMPARISON:  09/14/2013 CT.  No comparison MR.  FINDINGS: Suggestion of tiny acute infarct right occipital lobe.  Hemorrhagic breakdown products within the right central sulcus. This may reflect result of prior trauma (not seen as acute hemorrhage on accompanying CT). No other areas of intracranial hemorrhage noted.  Moderate small vessel disease type changes.  Remote tiny mid centrum semiovale infarct.  Global atrophy without hydrocephalus.  No intracranial mass lesion noted on this unenhanced exam.  Transverse ligament hypertrophy. Cervical medullary junction, pituitary region, pineal region and orbital structures unremarkable.  Major intracranial vascular structures are patent. Atherosclerotic type changes left vertebral artery suspected.  IMPRESSION: Suggestion of tiny acute infarct right occipital lobe.  Hemorrhagic breakdown products within the right central sulcus. This may reflect result of prior trauma (not seen as acute hemorrhage on accompanying CT).  Moderate small vessel disease type changes.  Remote tiny mid centrum semiovale infarct.  Global atrophy without hydrocephalus.  Please see above.   Electronically  Signed   By: Chauncey Cruel M.D.   On: 09/17/2013 12:01    Microbiology: Recent Results (from the past 240 hour(s))  CULTURE, BLOOD (ROUTINE X 2)     Status: None   Collection Time    09/14/13  8:25 PM      Result Value Ref Range Status   Specimen Description BLOOD ARM LEFT   Final   Special Requests BOTTLES DRAWN AEROBIC AND ANAEROBIC 5CC   Final   Culture  Setup Time     Final   Value: 09/15/2013 03:49     Performed at [facility]   Culture     Final   Value: NO GROWTH 5 DAYS     Performed at [facility]   Report Status 09/21/2013 FINAL   Final  CULTURE, BLOOD (ROUTINE X 2)     Status: None   Collection Time    09/14/13  9:00 PM      Result Value Ref Range Status   Specimen Description BLOOD RIGHT HAND   Final   Special Requests BOTTLES DRAWN AEROBIC ONLY 3CC   Final   Culture  Setup Time     Final   Value: 09/15/2013 03:49     Performed at [facility]   Culture     Final   Value: NO GROWTH 5 DAYS     Performed at [facility]   Report Status 09/21/2013 FINAL   Final  URINE CULTURE     Status: None   Collection Time    09/17/13  3:15 AM      Result Value Ref Range Status   Specimen Description URINE, RANDOM   Final   Special Requests NONE   Final   Culture  Setup Time     Final   Value: 09/17/2013 09:56     Performed at [place]     Final   Value: >=100,000 COLONIES/ML     Performed at [facility]   Culture     Final   Value: ESCHERICHIA COLI     Performed at [facility]   Report Status 09/19/2013 FINAL   Final   Organism ID, Bacteria ESCHERICHIA COLI   Final     Labs: Basic Metabolic Panel:  Recent Labs Lab 09/14/13 2025 09/16/13 0540  NA 140 141  K 3.8 4.3  CL 96 104  CO2 28 25  GLUCOSE 141* 146*  BUN 25* 15  CREATININE 1.26* 0.69  CALCIUM 9.4 8.5   Liver Function Tests:  Recent Labs Lab 09/14/13 2025  AST 40*  ALT 21  ALKPHOS 79  BILITOT 0.4  PROT 7.3  ALBUMIN  3.9   No results found for this basename: LIPASE, AMYLASE,  in the last 168 hours  Recent Labs Lab 09/15/13 1206  AMMONIA 39   CBC:  Recent Labs Lab 09/14/13 2025 09/16/13 0540 09/21/13 0611  WBC 14.2* 10.5 7.9  NEUTROABS 12.6*  --   --   HGB 14.3 13.3 13.2  HCT 42.8 39.1 39.5  MCV 92.8 92.2 89.8  PLT 292 142* 314   Cardiac Enzymes:  Recent Labs Lab 09/14/13 2242 09/16/13 0540  CKTOTAL 2880* 1631*   BNP: BNP (last 3 results) No results found for this basename: PROBNP,  in the last 8760 hours CBG:  Recent Labs Lab 09/15/13 0423  GLUCAP 99       Signed:  Rjay Revolorio N  [facility] 09/21/2013, 10:20 AM

## 2013-09-21 NOTE — Progress Notes (Signed)
Patient ready for discharge, awaiting on PTAR for disposition. Assessments remains unchanged as at now.

## 2013-09-21 NOTE — Progress Notes (Signed)
PT Cancellation Note  Patient Details Name: [name] MRN: [id number] DOB: 12/05/1930   Cancelled Treatment:    Reason Eval/Treat Not Completed: Patient at procedure or test/unavailable;Other (comment) (pt not in room.  I believe she has d/c to SNF)   Wells Guiles B. [place], [place], DPT [phone]   09/21/2013, 2:17 PM

## 2013-09-22 ENCOUNTER — Other Ambulatory Visit: Payer: Self-pay | Admitting: *Deleted

## 2013-09-22 MED ORDER — OXYCODONE-ACETAMINOPHEN 5-325 MG PO TABS: 1.0000 | ORAL_TABLET | Freq: Four times a day (QID) | ORAL | Status: DC | PRN

## 2013-09-22 NOTE — Telephone Encounter (Signed)
[place]

## 2013-09-22 NOTE — Telephone Encounter (Signed)
[place]

## 2013-09-25 ENCOUNTER — Non-Acute Institutional Stay (SKILLED_NURSING_FACILITY): Payer: Medicare Other | Admitting: Internal Medicine

## 2013-09-25 DIAGNOSIS — J9621 Acute and chronic respiratory failure with hypoxia: Secondary | ICD-10-CM

## 2013-09-25 DIAGNOSIS — G929 Unspecified toxic encephalopathy: Secondary | ICD-10-CM

## 2013-09-25 DIAGNOSIS — T50901A Poisoning by unspecified drugs, medicaments and biological substances, accidental (unintentional), initial encounter: Secondary | ICD-10-CM

## 2013-09-25 DIAGNOSIS — N390 Urinary tract infection, site not specified: Secondary | ICD-10-CM

## 2013-09-25 DIAGNOSIS — T50901D Poisoning by unspecified drugs, medicaments and biological substances, accidental (unintentional), subsequent encounter: Secondary | ICD-10-CM

## 2013-09-25 DIAGNOSIS — I6340 Cerebral infarction due to embolism of unspecified cerebral artery: Secondary | ICD-10-CM

## 2013-09-25 DIAGNOSIS — R0902 Hypoxemia: Secondary | ICD-10-CM

## 2013-09-25 DIAGNOSIS — J9620 Acute and chronic respiratory failure, unspecified whether with hypoxia or hypercapnia: Secondary | ICD-10-CM

## 2013-09-25 DIAGNOSIS — Z5189 Encounter for other specified aftercare: Secondary | ICD-10-CM

## 2013-09-25 DIAGNOSIS — I6350 Cerebral infarction due to unspecified occlusion or stenosis of unspecified cerebral artery: Secondary | ICD-10-CM

## 2013-09-25 DIAGNOSIS — G92 Toxic encephalopathy: Secondary | ICD-10-CM

## 2013-09-25 DIAGNOSIS — I639 Cerebral infarction, unspecified: Secondary | ICD-10-CM

## 2013-09-25 DIAGNOSIS — E039 Hypothyroidism, unspecified: Secondary | ICD-10-CM

## 2013-10-02 ENCOUNTER — Other Ambulatory Visit: Payer: Self-pay | Admitting: *Deleted

## 2013-10-02 MED ORDER — ALPRAZOLAM 0.5 MG PO TABS: ORAL_TABLET | ORAL | Status: DC

## 2013-10-02 NOTE — Telephone Encounter (Signed)
[place]

## 2013-10-03 ENCOUNTER — Encounter: Payer: Self-pay | Admitting: Internal Medicine

## 2013-10-03 DIAGNOSIS — J9620 Acute and chronic respiratory failure, unspecified whether with hypoxia or hypercapnia: Secondary | ICD-10-CM | POA: Insufficient documentation

## 2013-10-03 DIAGNOSIS — G92 Toxic encephalopathy: Secondary | ICD-10-CM | POA: Insufficient documentation

## 2013-10-03 DIAGNOSIS — I639 Cerebral infarction, unspecified: Secondary | ICD-10-CM | POA: Insufficient documentation

## 2013-10-03 DIAGNOSIS — G929 Unspecified toxic encephalopathy: Secondary | ICD-10-CM | POA: Insufficient documentation

## 2013-10-03 DIAGNOSIS — N390 Urinary tract infection, site not specified: Secondary | ICD-10-CM | POA: Insufficient documentation

## 2013-10-03 NOTE — Assessment & Plan Note (Signed)
2/2 chronic pain-begin percocet prn and monitor

## 2013-10-03 NOTE — Assessment & Plan Note (Signed)
completed atx; afebrile

## 2013-10-03 NOTE — Assessment & Plan Note (Signed)
secondary to accidental narcotic and benzo overdose. encephalopathy symptoms resolved;  -Discussed with daughter, grandson bedside, she has history of same in the past to avoid overdose

## 2013-10-03 NOTE — Assessment & Plan Note (Signed)
due to drug overdose on top of COPD  -ABG 7.26-58-77-25  -symptoms resolved; CXR: Emphysematous changes in the lungs. No evidence of active pulmonary Disease.  -cont oxygen, prn bronchodilators,

## 2013-10-03 NOTE — Assessment & Plan Note (Signed)
TSH 6.6. Commence home dose Synthroid once able to take by mouth

## 2013-10-03 NOTE — Progress Notes (Signed)
MRN: [id number] Name: [name]  Sex: female Age: 80 y.o. DOB: January 15, 1931  [place] #: [name] Facility/Room: 505 Level Of Care: SNF Provider: Inocencio Homes D Emergency Contacts: Extended Emergency Contact Information Primary Emergency Contact: [name] [address] [place] Phone: [phone] Relation: Daughter Secondary Emergency Contact: [name] [place] Phone: [phone] Relation: Neighbor  Code Status:   Allergies: Aspirin; Penicillins; Biaxin; Sulfonamide derivatives; Codeine; Hydrocodone; and Sulfa antibiotics  Chief Complaint  Patient presents with  . nursing home admision    HPI: Patient is 80 y.o. female who is admitted to SNF after unintentional narcotic + benzo OD, a very small but new CVA and a UTI.  Past Medical History  Diagnosis Date  . COPD (chronic obstructive pulmonary disease)   . Carotid artery occlusion   . Arthritis   . Anxiety     Afraid , living alone  . Myocardial infarction 1987  . Anemia   . Bilateral leg pain     for years  . Femoral bruit   . Bronchitis, chronic   . Fall April 2014    Pt. has fallen twife in the last 2 months.   . Personal history of other diseases of digestive system     Upper GI bleed  . Generalized anxiety disorder   . Nonspecific abnormal electrocardiogram (ECG) (EKG)     Abnormal Results EKG  . Memory loss     Worsening  . Intestinal infection due to Clostridium difficile   . Colitis due to Clostridium difficile   . Hypertension   . CAD (coronary artery disease)   . Thyroid disease     Hypo-Thyroidism    Past Surgical History  Procedure Laterality Date  . Carotid endarterectomy  12/30/2008      Medication List       This list is accurate as of: 09/25/13 11:59 PM.  Always use your most recent med list.               ALPRAZolam 0.5 MG tablet  Commonly known as:  XANAX  Take 1 tablet (0.5 mg total) by mouth at bedtime.      AMITIZA 24 MCG capsule  Generic drug:  lubiprostone  Take 24 mcg by mouth daily.     budesonide-formoterol 160-4.5 MCG/ACT inhaler  Commonly known as:  SYMBICORT  Inhale 2 puffs into the lungs 2 (two) times daily as needed (for shortness of breath).     citalopram 20 MG tablet  Commonly known as:  CELEXA  Take 20 mg by mouth daily.     clopidogrel 75 MG tablet  Commonly known as:  PLAVIX  Take 1 tablet (75 mg total) by mouth daily with breakfast.     donepezil 10 MG tablet  Commonly known as:  ARICEPT  Take 10 mg by mouth at bedtime.     felodipine 5 MG 24 hr tablet  Commonly known as:  PLENDIL  Take 5 mg by mouth daily.     levothyroxine 100 MCG tablet  Commonly known as:  SYNTHROID, LEVOTHROID  Take 100 mcg by mouth daily before breakfast.     nitroGLYCERIN 0.4 MG SL tablet  Commonly known as:  NITROSTAT  Place 0.4 mg under the tongue every 5 (five) minutes as needed for chest pain.     oxyCODONE-acetaminophen 5-325 MG per tablet  Commonly known as:  PERCOCET/ROXICET  Take 1 tablet by mouth every 6 (  six) hours as needed. For pain     senna-docusate 8.6-50 MG per tablet  Commonly known as:  Senokot-S  Take 1 tablet by mouth 2 (two) times daily.        No orders of the defined types were placed in this encounter.    Immunization History  Administered Date(s) Administered  . Influenza Whole 01/07/2010    History  Substance Use Topics  . Smoking status: Former Smoker -- 15 years    Types: Cigarettes    Quit date: 04/09/1984  . Smokeless tobacco: Never Used  . Alcohol Use: No    Family history is noncontributory    Review of Systems  DATA OBTAINED: from patient; no c/o GENERAL:  no fevers, fatigue, appetite changes SKIN: No itching, rash or wounds EYES: No eye pain, redness, discharge EARS: No earache, tinnitus, change in hearing NOSE: No congestion, drainage or bleeding  MOUTH/THROAT: No mouth or tooth pain, No sore throat RESPIRATORY: No  cough, wheezing, SOB CARDIAC: No chest pain, palpitations, lower extremity edema  GI: No abdominal pain, No N/V/D or constipation, No heartburn or reflux  GU: No dysuria, frequency or urgency, or incontinence  MUSCULOSKELETAL: No unrelieved bone/joint pain NEUROLOGIC: No headache, dizziness or focal weakness PSYCHIATRIC: No overt anxiety or sadness. Sleeps well. No behavior issue.   Filed Vitals:   09/25/13 1630  BP: 140/55  Pulse: 92  Temp: 97.1 F (36.2 C)  Resp: 20    Physical Exam  GENERAL APPEARANCE: Alert, mod conversant. Appropriately groomed. No acute distress.  SKIN: No diaphoresis rash HEAD: Normocephalic, atraumatic  EYES: Conjunctiva/lids clear. Pupils round, reactive. EOMs intact.  EARS: External exam WNL, canals clear. Hearing grossly normal.  NOSE: No deformity or discharge.  MOUTH/THROAT: Lips w/o lesions RESPIRATORY: Breathing is even, unlabored. Lung sounds are clear   CARDIOVASCULAR: Heart RRR no murmurs, rubs or gallops. No peripheral edema.  GASTROINTESTINAL: Abdomen is soft, non-tender, not distended w/ normal bowel sounds GENITOURINARY: Bladder non tender, not distended  MUSCULOSKELETAL: No abnormal joints or musculature NEUROLOGIC: . Cranial nerves 2-12 grossly intact. Moves all extremities PSYCHIATRIC: Mood and affect appropriate to situation, no behavioral issues  Patient Active Problem List   Diagnosis Date Noted  . Toxic encephalopathy 10/03/2013  . UTI (urinary tract infection) 10/03/2013  . Acute-on-chronic respiratory failure 10/03/2013  . Cerebral embolism with cerebral infarction 09/18/2013  . Accidental overdose 09/14/2013  . Hypothermia 09/14/2013  . Spinal stenosis of lumbar region at multiple levels 02/15/2011  . PULMONARY NODULE 02/22/2010  . Nonspecific (abnormal) findings on radiological and other examination of body structure 11/29/2008  . CT, CHEST, ABNORMAL 11/29/2008  . HYPOTHYROIDISM 06/25/2007  . HYPERCHOLESTEROLEMIA  06/25/2007  . ANEMIA 06/25/2007  . AMI 06/25/2007  . ANGINA, MILD 06/25/2007  . CORONARY ARTERY DISEASE 06/25/2007  . HEMORRHOIDS, INTERNAL 06/25/2007  . HEMORRHOIDS, EXTERNAL 06/25/2007  . HEMOCCULT POSITIVE STOOL 06/25/2007  . PYELONEPHRITIS, ACUTE 06/25/2007  . COLONIC POLYPS, HYPERPLASTIC 10/15/1997  . DIVERTICULOSIS, COLON 10/15/1997    CBC    Component Value Date/Time   WBC 7.9 09/21/2013 0611   WBC 10.6* 09/28/2009 1422   RBC 4.40 09/21/2013 0611   RBC 4.07 09/28/2009 1422   RBC 4.01 09/28/2009 1422   HGB 13.2 09/21/2013 0611   HGB 9.4* 09/28/2009 1422   HCT 39.5 09/21/2013 0611   HCT 29.1* 09/28/2009 1422   PLT 314 09/21/2013 0611   PLT 319 09/28/2009 1422   MCV 89.8 09/21/2013 0611   MCV 73* 09/28/2009 1422   LYMPHSABS  0.9 09/14/2013 2025   LYMPHSABS 2.0 09/28/2009 1422   MONOABS 0.8 09/14/2013 2025   EOSABS 0.0 09/14/2013 2025   EOSABS 0.7* 09/28/2009 1422   BASOSABS 0.0 09/14/2013 2025   BASOSABS 0.0 09/28/2009 1422    CMP     Component Value Date/Time   NA 141 09/16/2013 0540   K 4.3 09/16/2013 0540   CL 104 09/16/2013 0540   CO2 25 09/16/2013 0540   GLUCOSE 146* 09/16/2013 0540   BUN 15 09/16/2013 0540   CREATININE 0.69 09/16/2013 0540   CALCIUM 8.5 09/16/2013 0540   PROT 7.3 09/14/2013 2025   ALBUMIN 3.9 09/14/2013 2025   AST 40* 09/14/2013 2025   ALT 21 09/14/2013 2025   ALKPHOS 79 09/14/2013 2025   BILITOT 0.4 09/14/2013 2025   GFRNONAA 79* 09/16/2013 0540   GFRAA >90 09/16/2013 0540    Assessment and Plan  Toxic encephalopathy secondary to accidental narcotic and benzo overdose. encephalopathy symptoms resolved;  -Discussed with daughter, grandson bedside, she has history of same in the past to avoid overdose   Cerebral embolism with cerebral infarction MRI: Suggestion of tiny acute infarct right occipital lobe  -Pt had upper extremity weakness, which is improving; ordered CVA work up; PT eval;; SNF  -started plavix, (Pt could not tolerate ASA) appriciate neurology input     UTI (urinary tract infection) completed atx; afebrile   Acute-on-chronic respiratory failure due to drug overdose on top of COPD  -ABG 7.26-58-77-25  -symptoms resolved; CXR: Emphysematous changes in the lungs. No evidence of active pulmonary Disease.  -cont oxygen, prn bronchodilators,    HYPOTHYROIDISM TSH 6.6. Commence home dose Synthroid once able to take by mouth   Accidental overdose 2/2 chronic pain-begin percocet prn and monitor    Hennie Duos, MD

## 2013-10-03 NOTE — Assessment & Plan Note (Signed)
MRI: Suggestion of tiny acute infarct right occipital lobe  -Pt had upper extremity weakness, which is improving; ordered CVA work up; PT eval;; SNF  -started plavix, (Pt could not tolerate ASA) appriciate neurology input

## 2013-10-08 ENCOUNTER — Encounter: Payer: Self-pay | Admitting: Internal Medicine

## 2013-10-08 ENCOUNTER — Non-Acute Institutional Stay (SKILLED_NURSING_FACILITY): Payer: Medicare Other | Admitting: Internal Medicine

## 2013-10-08 DIAGNOSIS — G929 Unspecified toxic encephalopathy: Secondary | ICD-10-CM

## 2013-10-08 DIAGNOSIS — G8929 Other chronic pain: Secondary | ICD-10-CM

## 2013-10-08 DIAGNOSIS — I6350 Cerebral infarction due to unspecified occlusion or stenosis of unspecified cerebral artery: Secondary | ICD-10-CM

## 2013-10-08 DIAGNOSIS — I639 Cerebral infarction, unspecified: Secondary | ICD-10-CM

## 2013-10-08 DIAGNOSIS — T50901S Poisoning by unspecified drugs, medicaments and biological substances, accidental (unintentional), sequela: Secondary | ICD-10-CM

## 2013-10-08 DIAGNOSIS — G92 Toxic encephalopathy: Secondary | ICD-10-CM

## 2013-10-08 DIAGNOSIS — I209 Angina pectoris, unspecified: Secondary | ICD-10-CM

## 2013-10-08 DIAGNOSIS — E039 Hypothyroidism, unspecified: Secondary | ICD-10-CM

## 2013-10-08 DIAGNOSIS — I2510 Atherosclerotic heart disease of native coronary artery without angina pectoris: Secondary | ICD-10-CM

## 2013-10-08 DIAGNOSIS — T6591XS Toxic effect of unspecified substance, accidental (unintentional), sequela: Secondary | ICD-10-CM

## 2013-10-08 NOTE — Progress Notes (Signed)
Patient ID: [name], female   DOB: 09-16-1930, 80 y.o.   MRN: [id number]   this is a discharge note.  Level of care skilled.  FacilityF.   Allergies: Aspirin; Penicillins; Biaxin; Sulfonamide derivatives; Codeine; Hydrocodone; and Sulfa antibiotics  Chief Complaint   Patient presents with   .   discharge note   HPI: Patient is 80 y.o. female who is admitted to SNF after unintentional narcotic + benzo OD, a very small but new CVA and a UTI.--- She has recovered well-she will be going home and has a very supportive family who apparently are quite involved with her.  Apparently she will be home alone-.  Her stay here as been quite unremarkable vital signs have been stable-he does have a history of intermittent angina which is relieved with nitroglycerin.  She does not complaining of any fever chills or chest pain today.  She has progressed well with therapy -- is ambulating quite well with a walker does not complaining of any unrelieved joint pain she does continue with Percocet as needed for severe pain  Past Medical History   Diagnosis  Date   .  COPD (chronic obstructive pulmonary disease)    .  Carotid artery occlusion    .  Arthritis    .  Anxiety      Afraid , living alone   .  Myocardial infarction  1987   .  Anemia    .  Bilateral leg pain      for years   .  Femoral bruit    .  Bronchitis, chronic    .  Fall  April 2014     Pt. has fallen twife in the last 2 months.   .  Personal history of other diseases of digestive system      Upper GI bleed   .  Generalized anxiety disorder    .  Nonspecific abnormal electrocardiogram (ECG) (EKG)      Abnormal Results EKG   .  Memory loss      Worsening   .  Intestinal infection due to Clostridium difficile    .  Colitis due to Clostridium difficile    .  Hypertension    .  CAD (coronary artery disease)    .  Thyroid disease      Hypo-Thyroidism    Past Surgical History   Procedure  Laterality  Date   .  Carotid  endarterectomy   12/30/2008      Medication List                     ALPRAZolam 0.5 MG tablet    Commonly known as: XANAX    Take 1 tablet (0.5 mg total) by mouth at bedtime.    AMITIZA 24 MCG capsule    Generic drug: lubiprostone    Take 24 mcg by mouth daily.    budesonide-formoterol 160-4.5 MCG/ACT inhaler    Commonly known as: SYMBICORT    Inhale 2 puffs into the lungs 2 (two) times daily as needed (for shortness of breath).    citalopram 20 MG tablet    Commonly known as: CELEXA    Take 20 mg by mouth daily.    clopidogrel 75 MG tablet    Commonly known as: PLAVIX    Take 1 tablet (75 mg total) by mouth daily with breakfast.    donepezil 10 MG tablet    Commonly known as: ARICEPT    Take 10 mg  by mouth at bedtime.    felodipine 5 MG 24 hr tablet    Commonly known as: PLENDIL    Take 5 mg by mouth daily.    levothyroxine 100 MCG tablet    Commonly known as: SYNTHROID, LEVOTHROID    Take 100 mcg by mouth daily before breakfast.    nitroGLYCERIN 0.4 MG SL tablet    Commonly known as: NITROSTAT    Place 0.4 mg under the tongue every 5 (five) minutes as needed for chest pain.    oxyCODONE-acetaminophen 5-325 MG per tablet    Commonly known as: PERCOCET/ROXICET    Take 1 tablet by mouth every 6 (six) hours as needed. For pain    senna-docusate 8.6-50 MG per tablet    Commonly known as: Senokot-S    Take 1 tablet by mouth 2 (two) times daily.      History   Substance Use Topics   .  Smoking status:  Former Smoker -- 15 years     Types:  Cigarettes     Quit date:  04/09/1984   .  Smokeless tobacco:  Never Used   .  Alcohol Use:  No   Family history is noncontributory  Review of Systems  DATA OBTAINED: from patient; no c/o  GENERAL: no fevers, fatigue, appetite changes  SKIN: No itching, rash or wounds  EYES: No eye pain, redness, discharge  EARS: No earache, tinnitus, change in hearing  NOSE: No congestion, drainage or bleeding  MOUTH/THROAT: No mouth or tooth  pain, No sore throat  RESPIRATORY: No cough, wheezing, SOB  CARDIAC: No chest pain, palpitations, lower extremity edema--has history of angina as noted above  GI: No abdominal pain, No N/V/D or constipation, No heartburn or reflux  GU: No dysuria, frequency or urgency, or incontinence  MUSCULOSKELETAL: No unrelieved bone/joint pain  NEUROLOGIC: No headache, dizziness or focal weakness  PSYCHIATRIC: No overt anxiety or sadness. Sleeps well. No behavior issue.  Filed Vitals:                   Physical Exam  Temperature 97.7 pulse 78 respirations 18 blood pressure variable 149/74-114/63 in this range GENERAL APPEARANCE: Alert, conversant. Appropriately groomed. No acute distress.  SKIN: No diaphoresis rash  HEAD: Normocephalic, atraumatic  EYES: Conjunctiva/lids clear. Pupils round, reactive. EOMs intact.  EARS: External exam WNL, canals clear. Hearing grossly normal.  NOSE: No deformity or discharge.  MOUTH/THROAT: Lips w/o lesions  RESPIRATORY: Breathing is even, unlabored. Lung sounds are clear  CARDIOVASCULAR: Heart RRR no murmurs, rubs or gallops. No peripheral edema.  GASTROINTESTINAL: Abdomen is soft, non-tender, not distended w/ normal bowel sounds   MUSCULOSKELETAL: No abnormal joints or musculature ambulating with walker-I cannot appreciate any lateralizing findings of significance NEUROLOGIC: . Cranial nerves 2-12 grossly intact. Moves all extremities  PSYCHIATRIC: Mood and affect appropriate to situation, no behavioral issues --appeared to give an accurate history Patient Active Problem List    Diagnosis  Date Noted   .  Toxic encephalopathy  10/03/2013   .  UTI (urinary tract infection)  10/03/2013   .  Acute-on-chronic respiratory failure  10/03/2013   .  Cerebral embolism with cerebral infarction  09/18/2013   .  Accidental overdose  09/14/2013   .  Hypothermia  09/14/2013   .  Spinal stenosis of lumbar region at multiple levels  02/15/2011   .  PULMONARY NODULE   02/22/2010   .  Nonspecific (abnormal) findings on radiological and other examination of body structure  11/29/2008   .  CT, CHEST, ABNORMAL  11/29/2008   .  HYPOTHYROIDISM  06/25/2007   .  HYPERCHOLESTEROLEMIA  06/25/2007   .  ANEMIA  06/25/2007   .  AMI  06/25/2007   .  ANGINA, MILD  06/25/2007   .  CORONARY ARTERY DISEASE  06/25/2007   .  HEMORRHOIDS, INTERNAL  06/25/2007   .  HEMORRHOIDS, EXTERNAL  06/25/2007   .  HEMOCCULT POSITIVE STOOL  06/25/2007   .  PYELONEPHRITIS, ACUTE  06/25/2007   .  COLONIC POLYPS, HYPERPLASTIC  10/15/1997   .  DIVERTICULOSIS, COLON  10/15/1997   CBC    Component  Value  Date/Time    WBC  7.9  09/21/2013 0611    WBC  10.6*  09/28/2009 1422    RBC  4.40  09/21/2013 0611    RBC  4.07  09/28/2009 1422    RBC  4.01  09/28/2009 1422    HGB  13.2  09/21/2013 0611    HGB  9.4*  09/28/2009 1422    HCT  39.5  09/21/2013 0611    HCT  29.1*  09/28/2009 1422    PLT  314  09/21/2013 0611    PLT  319  09/28/2009 1422    MCV  89.8  09/21/2013 0611    MCV  73*  09/28/2009 1422    LYMPHSABS  0.9  09/14/2013 2025    LYMPHSABS  2.0  09/28/2009 1422    MONOABS  0.8  09/14/2013 2025    EOSABS  0.0  09/14/2013 2025    EOSABS  0.7*  09/28/2009 1422    BASOSABS  0.0  09/14/2013 2025    BASOSABS  0.0  09/28/2009 1422   CMP    Component  Value  Date/Time    NA  141  09/16/2013 0540    K  4.3  09/16/2013 0540    CL  104  09/16/2013 0540    CO2  25  09/16/2013 0540    GLUCOSE  146*  09/16/2013 0540    BUN  15  09/16/2013 0540    CREATININE  0.69  09/16/2013 0540    CALCIUM  8.5  09/16/2013 0540    PROT  7.3  09/14/2013 2025    ALBUMIN  3.9  09/14/2013 2025    AST  40*  09/14/2013 2025    ALT  21  09/14/2013 2025    ALKPHOS  79  09/14/2013 2025    BILITOT  0.4  09/14/2013 2025    GFRNONAA  79*  09/16/2013 0540    GFRAA  >90  09/16/2013 0540   Assessment and Plan  Toxic encephalopathy  secondary to accidental narcotic and benzo overdose. encephalopathy symptoms resolved;  She will be going home  family is aware of this and apparently will be monitoring this   Cerebral embolism with cerebral infarction  MRI: Suggestion of tiny acute infarct right occipital lobe  -Pt had upper extremity weakness, which is improving-is ambulating with a walker  -started plavix, (Pt could not tolerate ASA)t  UTI (urinary tract infection)  completed atx; afebrile  Acute-on-chronic respiratory failure  due to drug overdose on top of COPD  -ABG 7.26-58-77-25  -symptoms resolved; CXR: Emphysematous changes in the lungs. No evidence of active pulmonary Disease.  -, prn bronchodilators, This is not really been an issue during her stay here to my knowledge  HYPOTHYROIDISM  TSH 6.6. In hospital she is on Synthroid will defer followup to primary  care provider  Accidental overdose  2/2 chronic pain-on Percocet when necessary and this appears to be adequate at this point  History of intermittent angina-he does have nitroglycerin when necessary which apparently has given him relief.  Nausea-- she does have an order for Phenergan as needed.  Depression this appears stable she is on Celexa.  Hypertension as she does appear to have some variability here but I do not see consistent elevations she is on FELODIPINE-  Dementia-this appears to be quite mild she is on Aricept    Of note will obtain updated CBC BMP before discharge this appears to be stable    Patient stay here appears to have been quite unremarkable she has gained strength appears to have strong social support at home--will need PT and OT for further strengthening with the history small CVA which she appears to have recovered quite well from also CNA and social work support for her social issues as well as some help with her ADLs--- she will need nursing support her again in followup of her medical issues including recent CVA and monitoring of her medications  [id number]--of note for more than 30 minutes spent on this discharge summary-scripts have  bee written

## 2013-11-01 ENCOUNTER — Encounter (HOSPITAL_COMMUNITY): Payer: Self-pay | Admitting: Emergency Medicine

## 2013-11-01 ENCOUNTER — Emergency Department (HOSPITAL_COMMUNITY): Payer: Medicare Other

## 2013-11-01 ENCOUNTER — Observation Stay (HOSPITAL_COMMUNITY)
Admission: EM | Admit: 2013-11-01 | Discharge: 2013-11-04 | Disposition: A | Discharge status: Skilled Nursing Facility | Payer: Medicare Other | Attending: Internal Medicine | Admitting: Internal Medicine

## 2013-11-01 ENCOUNTER — Observation Stay (HOSPITAL_COMMUNITY): Payer: Medicare Other

## 2013-11-01 DIAGNOSIS — J9610 Chronic respiratory failure, unspecified whether with hypoxia or hypercapnia: Secondary | ICD-10-CM | POA: Insufficient documentation

## 2013-11-01 DIAGNOSIS — R5383 Other fatigue: Secondary | ICD-10-CM

## 2013-11-01 DIAGNOSIS — R4182 Altered mental status, unspecified: Secondary | ICD-10-CM | POA: Diagnosis present

## 2013-11-01 DIAGNOSIS — R5381 Other malaise: Secondary | ICD-10-CM | POA: Diagnosis not present

## 2013-11-01 DIAGNOSIS — F322 Major depressive disorder, single episode, severe without psychotic features: Secondary | ICD-10-CM

## 2013-11-01 DIAGNOSIS — I2510 Atherosclerotic heart disease of native coronary artery without angina pectoris: Secondary | ICD-10-CM | POA: Insufficient documentation

## 2013-11-01 DIAGNOSIS — R634 Abnormal weight loss: Secondary | ICD-10-CM | POA: Insufficient documentation

## 2013-11-01 DIAGNOSIS — R404 Transient alteration of awareness: Secondary | ICD-10-CM | POA: Insufficient documentation

## 2013-11-01 DIAGNOSIS — Z79899 Other long term (current) drug therapy: Secondary | ICD-10-CM | POA: Insufficient documentation

## 2013-11-01 DIAGNOSIS — E039 Hypothyroidism, unspecified: Secondary | ICD-10-CM | POA: Diagnosis not present

## 2013-11-01 DIAGNOSIS — I10 Essential (primary) hypertension: Secondary | ICD-10-CM | POA: Insufficient documentation

## 2013-11-01 DIAGNOSIS — E43 Unspecified severe protein-calorie malnutrition: Secondary | ICD-10-CM

## 2013-11-01 DIAGNOSIS — I6529 Occlusion and stenosis of unspecified carotid artery: Secondary | ICD-10-CM | POA: Insufficient documentation

## 2013-11-01 DIAGNOSIS — J4489 Other specified chronic obstructive pulmonary disease: Secondary | ICD-10-CM | POA: Insufficient documentation

## 2013-11-01 DIAGNOSIS — F411 Generalized anxiety disorder: Secondary | ICD-10-CM | POA: Diagnosis not present

## 2013-11-01 DIAGNOSIS — E876 Hypokalemia: Secondary | ICD-10-CM | POA: Diagnosis not present

## 2013-11-01 DIAGNOSIS — F3289 Other specified depressive episodes: Secondary | ICD-10-CM | POA: Diagnosis not present

## 2013-11-01 DIAGNOSIS — I252 Old myocardial infarction: Secondary | ICD-10-CM | POA: Insufficient documentation

## 2013-11-01 DIAGNOSIS — K5289 Other specified noninfective gastroenteritis and colitis: Secondary | ICD-10-CM | POA: Diagnosis not present

## 2013-11-01 DIAGNOSIS — Z7902 Long term (current) use of antithrombotics/antiplatelets: Secondary | ICD-10-CM | POA: Diagnosis not present

## 2013-11-01 DIAGNOSIS — Z9981 Dependence on supplemental oxygen: Secondary | ICD-10-CM | POA: Diagnosis not present

## 2013-11-01 DIAGNOSIS — J449 Chronic obstructive pulmonary disease, unspecified: Secondary | ICD-10-CM | POA: Diagnosis not present

## 2013-11-01 DIAGNOSIS — Z8673 Personal history of transient ischemic attack (TIA), and cerebral infarction without residual deficits: Secondary | ICD-10-CM | POA: Diagnosis not present

## 2013-11-01 DIAGNOSIS — Z66 Do not resuscitate: Secondary | ICD-10-CM | POA: Diagnosis not present

## 2013-11-01 DIAGNOSIS — G9349 Other encephalopathy: Secondary | ICD-10-CM | POA: Diagnosis not present

## 2013-11-01 DIAGNOSIS — G9340 Encephalopathy, unspecified: Secondary | ICD-10-CM | POA: Diagnosis present

## 2013-11-01 DIAGNOSIS — F329 Major depressive disorder, single episode, unspecified: Secondary | ICD-10-CM | POA: Diagnosis not present

## 2013-11-01 DIAGNOSIS — I6340 Cerebral infarction due to embolism of unspecified cerebral artery: Secondary | ICD-10-CM

## 2013-11-01 DIAGNOSIS — Z9181 History of falling: Secondary | ICD-10-CM | POA: Insufficient documentation

## 2013-11-01 DIAGNOSIS — J9611 Chronic respiratory failure with hypoxia: Secondary | ICD-10-CM | POA: Diagnosis present

## 2013-11-01 DIAGNOSIS — K529 Noninfective gastroenteritis and colitis, unspecified: Secondary | ICD-10-CM | POA: Diagnosis present

## 2013-11-01 DIAGNOSIS — Z87891 Personal history of nicotine dependence: Secondary | ICD-10-CM | POA: Diagnosis not present

## 2013-11-01 DIAGNOSIS — R410 Disorientation, unspecified: Secondary | ICD-10-CM

## 2013-11-01 LAB — DIFFERENTIAL
Basophils Absolute: 0 10*3/uL (ref 0.0–0.1)
Basophils Relative: 0 % (ref 0–1)
Eosinophils Absolute: 0 10*3/uL (ref 0.0–0.7)
Eosinophils Relative: 0 % (ref 0–5)
Lymphocytes Relative: 21 % (ref 12–46)
Lymphs Abs: 1.4 10*3/uL (ref 0.7–4.0)
MONOS PCT: 7 % (ref 3–12)
Monocytes Absolute: 0.4 10*3/uL (ref 0.1–1.0)
Neutro Abs: 4.8 10*3/uL (ref 1.7–7.7)
Neutrophils Relative %: 72 % (ref 43–77)

## 2013-11-01 LAB — COMPREHENSIVE METABOLIC PANEL
ALBUMIN: 4.1 g/dL (ref 3.5–5.2)
ALT: 9 U/L (ref 0–35)
AST: 16 U/L (ref 0–37)
Alkaline Phosphatase: 65 U/L (ref 39–117)
Anion gap: 15 (ref 5–15)
BUN: 11 mg/dL (ref 6–23)
CALCIUM: 9.7 mg/dL (ref 8.4–10.5)
CO2: 26 mEq/L (ref 19–32)
Chloride: 102 mEq/L (ref 96–112)
Creatinine, Ser: 0.57 mg/dL (ref 0.50–1.10)
GFR calc Af Amer: >90 mL/min (ref >90)
GFR calc non Af Amer: 85 mL/min — ABNORMAL LOW (ref >90)
Glucose, Bld: 122 mg/dL — ABNORMAL HIGH (ref 70–99)
POTASSIUM: 3.5 meq/L — AB (ref 3.7–5.3)
Sodium: 143 mEq/L (ref 137–147)
Total Bilirubin: 0.8 mg/dL (ref 0.3–1.2)
Total Protein: 7.3 g/dL (ref 6.0–8.3)

## 2013-11-01 LAB — URINALYSIS, ROUTINE W REFLEX MICROSCOPIC
Bilirubin Urine: NEGATIVE
GLUCOSE, UA: NEGATIVE mg/dL
Ketones, ur: NEGATIVE mg/dL
LEUKOCYTES UA: NEGATIVE
Nitrite: NEGATIVE
PH: 6 (ref 5.0–8.0)
Protein, ur: NEGATIVE mg/dL
SPECIFIC GRAVITY, URINE: 1.016 (ref 1.005–1.030)
Urobilinogen, UA: 0.2 mg/dL (ref 0.0–1.0)

## 2013-11-01 LAB — CBC
HEMATOCRIT: 42.1 % (ref 36.0–46.0)
HEMOGLOBIN: 14.2 g/dL (ref 12.0–15.0)
MCH: 31.3 pg (ref 26.0–34.0)
MCHC: 33.7 g/dL (ref 30.0–36.0)
MCV: 92.7 fL (ref 78.0–100.0)
Platelets: 280 10*3/uL (ref 150–400)
RBC: 4.54 MIL/uL (ref 3.87–5.11)
RDW: 15.1 % (ref 11.5–15.5)
WBC: 6.6 10*3/uL (ref 4.0–10.5)

## 2013-11-01 LAB — RAPID URINE DRUG SCREEN, HOSP PERFORMED
AMPHETAMINES: NOT DETECTED
Barbiturates: NOT DETECTED
Benzodiazepines: NOT DETECTED
Cocaine: NOT DETECTED
OPIATES: NOT DETECTED
TETRAHYDROCANNABINOL: NOT DETECTED

## 2013-11-01 LAB — URINE MICROSCOPIC-ADD ON

## 2013-11-01 LAB — I-STAT TROPONIN, ED: Troponin i, poc: 0 ng/mL (ref 0.00–0.08)

## 2013-11-01 LAB — I-STAT CG4 LACTIC ACID, ED: LACTIC ACID, VENOUS: 0.58 mmol/L (ref 0.5–2.2)

## 2013-11-01 MED ORDER — NITROGLYCERIN 0.4 MG SL SUBL: 0.4000 mg | SUBLINGUAL_TABLET | SUBLINGUAL | Status: DC | PRN

## 2013-11-01 MED ORDER — CLOPIDOGREL BISULFATE 75 MG PO TABS
75.0000 mg | ORAL_TABLET | Freq: Every day | ORAL | Status: DC
  Administered 2013-11-01 – 2013-11-04 (×3): 75 mg via ORAL
  Filled 2013-11-01 (×4): qty 1

## 2013-11-01 MED ORDER — TIOTROPIUM BROMIDE MONOHYDRATE 18 MCG IN CAPS
18.0000 ug | ORAL_CAPSULE | Freq: Every day | RESPIRATORY_TRACT | Status: DC
  Administered 2013-11-03 – 2013-11-04 (×2): 18 ug via RESPIRATORY_TRACT
  Filled 2013-11-01: qty 5

## 2013-11-01 MED ORDER — ACETAMINOPHEN 325 MG PO TABS: 650.0000 mg | ORAL_TABLET | Freq: Four times a day (QID) | ORAL | Status: DC | PRN

## 2013-11-01 MED ORDER — ALBUTEROL SULFATE (2.5 MG/3ML) 0.083% IN NEBU: 2.5000 mg | INHALATION_SOLUTION | RESPIRATORY_TRACT | Status: DC | PRN

## 2013-11-01 MED ORDER — DONEPEZIL HCL 10 MG PO TABS
10.0000 mg | ORAL_TABLET | Freq: Every day | ORAL | Status: DC
  Administered 2013-11-01 – 2013-11-03 (×3): 10 mg via ORAL
  Filled 2013-11-01 (×4): qty 1

## 2013-11-01 MED ORDER — SODIUM CHLORIDE 0.9 % IJ SOLN
3.0000 mL | Freq: Two times a day (BID) | INTRAMUSCULAR | Status: DC
  Administered 2013-11-01 – 2013-11-04 (×4): 3 mL via INTRAVENOUS

## 2013-11-01 MED ORDER — BUDESONIDE-FORMOTEROL FUMARATE 160-4.5 MCG/ACT IN AERO
2.0000 | INHALATION_SPRAY | Freq: Two times a day (BID) | RESPIRATORY_TRACT | Status: DC
  Administered 2013-11-02 – 2013-11-04 (×5): 2 via RESPIRATORY_TRACT
  Filled 2013-11-01: qty 6

## 2013-11-01 MED ORDER — IPRATROPIUM-ALBUTEROL 0.5-2.5 (3) MG/3ML IN SOLN
3.0000 mL | Freq: Four times a day (QID) | RESPIRATORY_TRACT | Status: DC | PRN
Start: 2013-11-01 — End: 2013-11-01

## 2013-11-01 MED ORDER — ONDANSETRON HCL 4 MG PO TABS: 4.0000 mg | ORAL_TABLET | Freq: Four times a day (QID) | ORAL | Status: DC | PRN

## 2013-11-01 MED ORDER — ACETAMINOPHEN 650 MG RE SUPP: 650.0000 mg | Freq: Four times a day (QID) | RECTAL | Status: DC | PRN

## 2013-11-01 MED ORDER — PROMETHAZINE HCL 12.5 MG PO TABS
6.2500 mg | ORAL_TABLET | Freq: Three times a day (TID) | ORAL | Status: DC | PRN
  Filled 2013-11-01: qty 1

## 2013-11-01 MED ORDER — ONDANSETRON HCL 4 MG/2ML IJ SOLN: 4.0000 mg | Freq: Four times a day (QID) | INTRAMUSCULAR | Status: DC | PRN

## 2013-11-01 MED ORDER — BUSPIRONE HCL 5 MG PO TABS
7.5000 mg | ORAL_TABLET | Freq: Two times a day (BID) | ORAL | Status: DC
  Administered 2013-11-01 – 2013-11-04 (×6): 7.5 mg via ORAL
  Filled 2013-11-01 (×8): qty 1.5

## 2013-11-01 MED ORDER — SODIUM CHLORIDE 0.9 % IV SOLN
INTRAVENOUS | Status: DC
  Administered 2013-11-01 – 2013-11-03 (×3): via INTRAVENOUS

## 2013-11-01 MED ORDER — FELODIPINE ER 5 MG PO TB24
5.0000 mg | ORAL_TABLET | Freq: Every day | ORAL | Status: DC
  Administered 2013-11-01 – 2013-11-04 (×4): 5 mg via ORAL
  Filled 2013-11-01 (×4): qty 1

## 2013-11-01 MED ORDER — GABAPENTIN 300 MG PO CAPS
300.0000 mg | ORAL_CAPSULE | Freq: Every day | ORAL | Status: DC
  Administered 2013-11-01 – 2013-11-03 (×3): 300 mg via ORAL
  Filled 2013-11-01 (×4): qty 1

## 2013-11-01 MED ORDER — CITALOPRAM HYDROBROMIDE 20 MG PO TABS
20.0000 mg | ORAL_TABLET | Freq: Every day | ORAL | Status: DC
  Administered 2013-11-01 – 2013-11-04 (×4): 20 mg via ORAL
  Filled 2013-11-01 (×4): qty 1

## 2013-11-01 MED ORDER — ALBUTEROL SULFATE HFA 108 (90 BASE) MCG/ACT IN AERS: 2.0000 | INHALATION_SPRAY | RESPIRATORY_TRACT | Status: DC | PRN

## 2013-11-01 MED ORDER — OXYCODONE-ACETAMINOPHEN 5-325 MG PO TABS
1.0000 | ORAL_TABLET | Freq: Four times a day (QID) | ORAL | Status: DC | PRN
  Administered 2013-11-02 – 2013-11-04 (×4): 1 via ORAL
  Filled 2013-11-01 (×4): qty 1

## 2013-11-01 MED ORDER — ENOXAPARIN SODIUM 40 MG/0.4ML ~~LOC~~ SOLN
40.0000 mg | SUBCUTANEOUS | Status: DC
Start: 2013-11-01 — End: 2013-11-04
  Administered 2013-11-01 – 2013-11-03 (×3): 40 mg via SUBCUTANEOUS
  Filled 2013-11-01 (×4): qty 0.4

## 2013-11-01 MED ORDER — HYDROXYZINE HCL 25 MG PO TABS
25.0000 mg | ORAL_TABLET | Freq: Every evening | ORAL | Status: DC | PRN
  Filled 2013-11-01: qty 1

## 2013-11-01 MED ORDER — LEVOTHYROXINE SODIUM 100 MCG PO TABS
100.0000 ug | ORAL_TABLET | Freq: Every day | ORAL | Status: DC
  Administered 2013-11-02 – 2013-11-04 (×3): 100 ug via ORAL
  Filled 2013-11-01 (×4): qty 1

## 2013-11-01 MED ORDER — LUBIPROSTONE 24 MCG PO CAPS
24.0000 ug | ORAL_CAPSULE | Freq: Every day | ORAL | Status: DC | PRN
  Filled 2013-11-01: qty 1

## 2013-11-01 MED ORDER — QUETIAPINE FUMARATE 25 MG PO TABS
25.0000 mg | ORAL_TABLET | Freq: Every day | ORAL | Status: DC
  Administered 2013-11-01 – 2013-11-03 (×3): 25 mg via ORAL
  Filled 2013-11-01 (×5): qty 1

## 2013-11-01 NOTE — H&P (Signed)
[facility] History and Physical  Patient: [name]  NGE:[id number]  DOB: Aug 20, 1930  DOS: the patient was seen and examined on 11/01/2013 PCP: [name], PA-C  Chief Complaint: Confusion  HPI: [name] is a 80 y.o. female with Past medical history of COPD, anxiety, coronary artery disease, hypothyroidism, hypertension. Patient presented with complaints of confusion. She was brought in by her family members the patient was recently admitted for confusion and was found to have possible accidental drug overdose. Her medications were adjusted also due to her hypothyroidism and she was in the nursing home. She had a right-sided weakness during that admission and was also found to have a CVA and she was placed on Plavix. Today when her son went to check on her she was found confused and disoriented with generalized weakness and at her baseline she is fairly active and talkative. Patient does not remember anything that happened today. She mentions the last and that she remembers was going to sleep last night. She denies any dizziness or lightheadedness or any blurring of her vision she denies any vertigo. She mentions she had episodes of nausea vomiting with diarrhea last night but no diarrhea today and she denies any nausea at the time of my evaluation. She complained of being generalized weakness and tiredness which is marked her baseline. She denies any chest pain cough fever chills abdominal pain burning urination. She has lost significant weight in last few months. On 6/14 109 lbs, 118lbs 6/15, 98 lbs today.  The patient is coming from home. And at her baseline independent for most of her ADL.  Review of Systems: as mentioned in the history of present illness.  A Comprehensive review of the other systems is negative.  Past Medical History  Diagnosis Date  . COPD (chronic obstructive pulmonary disease)   . Carotid artery occlusion   . Arthritis   . Anxiety      Afraid , living alone  . Myocardial infarction 1987  . Anemia   . Bilateral leg pain     for years  . Femoral bruit   . Bronchitis, chronic   . Fall April 2014    Pt. has fallen twife in the last 2 months.   . Personal history of other diseases of digestive system     Upper GI bleed  . Generalized anxiety disorder   . Nonspecific abnormal electrocardiogram (ECG) (EKG)     Abnormal Results EKG  . Memory loss     Worsening  . Intestinal infection due to Clostridium difficile   . Colitis due to Clostridium difficile   . Hypertension   . CAD (coronary artery disease)   . Thyroid disease     Hypo-Thyroidism   Past Surgical History  Procedure Laterality Date  . Carotid endarterectomy  12/30/2008   Social History:  reports that she quit smoking about 29 years ago. Her smoking use included Cigarettes. She smoked 0.00 packs per day for 15 years. She has never used smokeless tobacco. She reports that she does not drink alcohol or use illicit drugs.  Allergies  Allergen Reactions  . Aspirin     GI  Bleed  . Penicillins     Numbness around mouth  . Biaxin [Clarithromycin] Nausea Only  . Sulfonamide Derivatives Nausea And Vomiting  . Codeine Nausea And Vomiting  . Hydrocodone Nausea And Vomiting  . Sulfa Antibiotics Nausea And Vomiting  . Xanax [Alprazolam]     unknown    Family History  Problem Relation Age of Onset  . Cancer Mother   . Deep vein thrombosis Mother   . Cancer Father   . Kidney disease Father   . Heart disease Father     Prior to Admission medications   Medication Sig Start Date End Date Taking? Authorizing Provider  AMITIZA 24 MCG capsule Take 24 mcg by mouth daily as needed for constipation.  02/14/11  Yes Historical Provider, MD  budesonide-formoterol (SYMBICORT) 160-4.5 MCG/ACT inhaler Inhale 2 puffs into the lungs 2 (two) times daily as needed (for shortness of breath).    Yes Historical Provider, MD  busPIRone (BUSPAR) 7.5 MG tablet Take 7.5 mg by mouth  2 (two) times daily.   Yes Historical Provider, MD  citalopram (CELEXA) 20 MG tablet Take 20 mg by mouth daily.  01/27/11  Yes Historical Provider, MD  clopidogrel (PLAVIX) 75 MG tablet Take 1 tablet (75 mg total) by mouth daily with breakfast. 09/21/13  Yes Kinnie Feil, MD  donepezil (ARICEPT) 10 MG tablet Take 10 mg by mouth at bedtime.    Yes Historical Provider, MD  felodipine (PLENDIL) 5 MG 24 hr tablet Take 5 mg by mouth daily.   Yes Historical Provider, MD  gabapentin (NEURONTIN) 300 MG capsule Take 300 mg by mouth at bedtime.   Yes Historical Provider, MD  hydrOXYzine (ATARAX/VISTARIL) 25 MG tablet Take 25 mg by mouth at bedtime as needed for anxiety.   Yes Historical Provider, MD  levothyroxine (SYNTHROID, LEVOTHROID) 100 MCG tablet Take 100 mcg by mouth daily before breakfast.  07/25/13  Yes Historical Provider, MD  nitroGLYCERIN (NITROSTAT) 0.4 MG SL tablet Place 0.4 mg under the tongue every 5 (five) minutes as needed for chest pain.   Yes Historical Provider, MD  oxyCODONE-acetaminophen (PERCOCET/ROXICET) 5-325 MG per tablet Take 1 tablet by mouth every 6 (six) hours as needed. For pain 09/22/13  Yes Mahima Pandey, MD  potassium chloride (MICRO-K) 10 MEQ CR capsule Take 10 mEq by mouth daily.   Yes Historical Provider, MD  promethazine (PHENERGAN) 12.5 MG tablet Take 6.25 mg by mouth every 6 (six) hours as needed for nausea or vomiting.   Yes Historical Provider, MD  QUEtiapine (SEROQUEL) 25 MG tablet Take 25 mg by mouth at bedtime.   Yes Historical Provider, MD  senna-docusate (SENOKOT-S) 8.6-50 MG per tablet Take 1 tablet by mouth 2 (two) times daily. 09/21/13  Yes Kinnie Feil, MD    Physical Exam: Filed Vitals:   11/01/13 1502 11/01/13 1530 11/01/13 1723 11/01/13 1957  BP:  164/77 148/69   Pulse:  94 95   Temp:  98.6 F (37 C)  98.2 F (36.8 C)  TempSrc:  Oral    Resp:  16 16   SpO2: 94% 91% 94%     General: Alert, Awake and Oriented to Time, Place and Person.  Appear in moderate distress Eyes: PERRL ENT: Oral Mucosa clear moist. Neck: no JVD Cardiovascular: S1 and S2 Present, no Murmur, Peripheral Pulses Present Respiratory: Bilateral Air entry equal and Decreased, no Crackles, occasional expiratory wheezes Abdomen: Bowel Sound Present, Soft and Non tender Skin: no Rash Extremities: no Pedal edema, no calf tenderness Neurologic: Grossly no focal neuro deficit.  Labs on Admission:  CBC:  Recent Labs Lab 11/01/13 1543  WBC 6.6  NEUTROABS 4.8  HGB 14.2  HCT 42.1  MCV 92.7  PLT 280    CMP     Component Value Date/Time   NA 143 11/01/2013 1543   K 3.5* 11/01/2013  1543   CL 102 11/01/2013 1543   CO2 26 11/01/2013 1543   GLUCOSE 122* 11/01/2013 1543   BUN 11 11/01/2013 1543   CREATININE 0.57 11/01/2013 1543   CALCIUM 9.7 11/01/2013 1543   PROT 7.3 11/01/2013 1543   ALBUMIN 4.1 11/01/2013 1543   AST 16 11/01/2013 1543   ALT 9 11/01/2013 1543   ALKPHOS 65 11/01/2013 1543   BILITOT 0.8 11/01/2013 1543   GFRNONAA 85* 11/01/2013 1543   GFRAA >90 11/01/2013 1543    No results found for this basename: LIPASE, AMYLASE,  in the last 168 hours No results found for this basename: AMMONIA,  in the last 168 hours  No results found for this basename: CKTOTAL, CKMB, CKMBINDEX, TROPONINI,  in the last 168 hours BNP (last 3 results) No results found for this basename: PROBNP,  in the last 8760 hours  Radiological Exams on Admission: Dg Chest 2 View  11/01/2013   CLINICAL DATA:  Altered level of consciousness.  EXAM: CHEST  2 VIEW  COMPARISON:  Chest x-ray 09/14/2013.  FINDINGS: Lung volumes are normal. No consolidative airspace disease. No pleural effusions. No pneumothorax. No pulmonary nodule or mass noted. Pulmonary vasculature and the cardiomediastinal silhouette are within normal limits. Atherosclerosis in the thoracic aorta.  IMPRESSION: 1.  No radiographic evidence of acute cardiopulmonary disease. 2. Atherosclerosis.   Electronically Signed   By:  Vinnie Langton M.D.   On: 11/01/2013 16:32   Ct Head Wo Contrast  11/01/2013   CLINICAL DATA:  Stroke 1 month ago, recently treated for urinary tract infection, altered mental status today  EXAM: CT HEAD WITHOUT CONTRAST  TECHNIQUE: Contiguous axial images were obtained from the base of the skull through the vertex without intravenous contrast.  COMPARISON:  MRI 09/17/2013, CT 09/14/2013  FINDINGS: The tiny lacunar infarct right occipital lobe seen on prior MRI is not appreciated by CT. As on prior studies, there is moderate diffuse atrophy with moderate to severe low attenuation in the deep white matter. There is no hemorrhage or extra-axial fluid. No evidence of vascular territory acute infarct. Calvarium is intact.  IMPRESSION: Chronic involutional change.  No acute findings.   Electronically Signed   By: Skipper Cliche M.D.   On: 11/01/2013 16:16    EKG: Independently reviewed. normal sinus rhythm, nonspecific ST and T waves changes. Assessment/Plan Principal Problem:   Altered mental state Active Problems:   HYPOTHYROIDISM   Cerebral embolism with cerebral infarction   Delirium   COPD (chronic obstructive pulmonary disease)   Chronic hypoxemic respiratory failure   1. Altered mental state Patient presented with complaints of confusion. She does not have any focal deficits CT of the head is negative for any acute abnormality. Her lab work does not show any acute abnormality as well. Urine toxicology and urinalysis is negative. She was initially confused on arrival as per ED physician. By the time of my evaluation that has been significant improvement in her mental status she continues to have generalized weakness the. With this the patient will be admitted to telemetry. We will continue to monitor serial neuro checks due to her recent history of CVA. Of probable etiology is generalized weakness secondary to diarrhea nausea vomiting due to gastroenteritis. Recent workup included a CT,  MRI, ammonia. We get Z61 folic acid. We'll continue to monitor.  2. Diarrhea, nausea vomiting and weight loss. Patient complains of diarrhea and nausea and vomiting. At present her symptoms have resolved. Possible gastroenteritis. Would continue to monitor. Weight loss.  Patient has significant loss of weight over last 1 month as well as over last one year. Etiology currently unclear may need nutritional consultation.  3. Hypothyroidism. Patient was hypothyroid. 2 weeks ago her Synthroid was increased when continue with the current dose at present.  4. COPD. Patient is on 2 L of oxygen at home chronically. She has occasional wheezes on examination and appears to have significant COPD based on her recent ABG at With this she is only on when necessary medication and will schedule medications therefore I will place her Symbicort from as needed 2 scheduled as well as place her on Spiriva and albuterol as needed to optimize her medications for COPD.  DVT Prophylaxis: subcutaneous Heparin Nutrition: Regular diet  Code Status: DNR/DNI  Disposition: Admitted to observation in telemetry unit.  Author: Berle Mull, MD [facility] Pager: [phone] 11/01/2013, 8:33 PM    If 7PM-7AM, please contact night-coverage [email or web address] Password [email or web address]  **Disclaimer: This note may have been dictated with voice recognition software. Similar sounding words can inadvertently be transcribed and this note may contain transcription errors which may not have been corrected upon publication of note.**

## 2013-11-01 NOTE — ED Notes (Signed)
Patient transported to CT 

## 2013-11-01 NOTE — ED Notes (Signed)
Bed: WA12 Expected date:  Expected time:  Means of arrival:  Comments: EMS 

## 2013-11-01 NOTE — ED Notes (Signed)
PA Geiple at bedside.

## 2013-11-01 NOTE — ED Provider Notes (Signed)
CSN: [id number]     Arrival date & time 11/01/13  1458 History   First MD Initiated Contact with Patient 11/01/13 1521     No chief complaint on file.    (Consider location/radiation/quality/duration/timing/severity/associated sxs/prior Treatment) HPI Comments: Patient with recent admission for accidental narcotics/benzo overdose, small stroke, UTI -- presents by EMS with AMS. Level V caveat due to AMS. Patient typically interactive and ambulatory. Today her behavior is changed. Family not at bedside at time of initial exam. Patient knows her name and her birth month. She does not know year, last name, date/year of birth. She c/o R arm pain and generalized weakness, also chest tightness.   The history is provided by the patient, the EMS personnel and medical records.    Past Medical History  Diagnosis Date  . COPD (chronic obstructive pulmonary disease)   . Carotid artery occlusion   . Arthritis   . Anxiety     Afraid , living alone  . Myocardial infarction 1987  . Anemia   . Bilateral leg pain     for years  . Femoral bruit   . Bronchitis, chronic   . Fall April 2014    Pt. has fallen twife in the last 2 months.   . Personal history of other diseases of digestive system     Upper GI bleed  . Generalized anxiety disorder   . Nonspecific abnormal electrocardiogram (ECG) (EKG)     Abnormal Results EKG  . Memory loss     Worsening  . Intestinal infection due to Clostridium difficile   . Colitis due to Clostridium difficile   . Hypertension   . CAD (coronary artery disease)   . Thyroid disease     Hypo-Thyroidism   Past Surgical History  Procedure Laterality Date  . Carotid endarterectomy  12/30/2008   Family History  Problem Relation Age of Onset  . Cancer Mother   . Deep vein thrombosis Mother   . Cancer Father   . Kidney disease Father   . Heart disease Father    History  Substance Use Topics  . Smoking status: Former Smoker -- 15 years    Types: Cigarettes   Quit date: 04/09/1984  . Smokeless tobacco: Never Used  . Alcohol Use: No   OB History   Grav Para Term Preterm Abortions TAB SAB Ect Mult Living                 Review of Systems  Unable to perform ROS: Mental status change      Allergies  Aspirin; Penicillins; Biaxin; Sulfonamide derivatives; Codeine; Hydrocodone; and Sulfa antibiotics  Home Medications   Prior to Admission medications   Medication Sig Start Date End Date Taking? Authorizing Provider  ALPRAZolam Duanne Moron) 0.5 MG tablet Take one tablet by mouth at bedtime to help rest 10/02/13   Tiffany L Reed, DO  AMITIZA 24 MCG capsule Take 24 mcg by mouth daily.  02/14/11   Historical Provider, MD  budesonide-formoterol (SYMBICORT) 160-4.5 MCG/ACT inhaler Inhale 2 puffs into the lungs 2 (two) times daily as needed (for shortness of breath).     Historical Provider, MD  citalopram (CELEXA) 20 MG tablet Take 20 mg by mouth daily.  01/27/11   Historical Provider, MD  clopidogrel (PLAVIX) 75 MG tablet Take 1 tablet (75 mg total) by mouth daily with breakfast. 09/21/13   Kinnie Feil, MD  donepezil (ARICEPT) 10 MG tablet Take 10 mg by mouth at bedtime.     Historical Provider,  MD  felodipine (PLENDIL) 5 MG 24 hr tablet Take 5 mg by mouth daily.    Historical Provider, MD  levothyroxine (SYNTHROID, LEVOTHROID) 100 MCG tablet Take 100 mcg by mouth daily before breakfast.  07/25/13   Historical Provider, MD  nitroGLYCERIN (NITROSTAT) 0.4 MG SL tablet Place 0.4 mg under the tongue every 5 (five) minutes as needed for chest pain.    Historical Provider, MD  oxyCODONE-acetaminophen (PERCOCET/ROXICET) 5-325 MG per tablet Take 1 tablet by mouth every 6 (six) hours as needed. For pain 09/22/13   Blanchie Serve, MD  senna-docusate (SENOKOT-S) 8.6-50 MG per tablet Take 1 tablet by mouth 2 (two) times daily. 09/21/13   Kinnie Feil, MD   BP 164/77  Pulse 94  Temp(Src) 98.6 F (37 C) (Oral)  Resp 16  SpO2 91% Physical Exam  Nursing note and  vitals reviewed. Constitutional: She appears well-developed and well-nourished.  HENT:  Head: Normocephalic and atraumatic.  Right Ear: Tympanic membrane, external ear and ear canal normal.  Left Ear: Tympanic membrane, external ear and ear canal normal.  Nose: Nose normal.  Mouth/Throat: Uvula is midline, oropharynx is clear and moist and mucous membranes are normal.  Eyes: Conjunctivae, EOM and lids are normal. Pupils are equal, round, and reactive to light. Right eye exhibits no discharge. Left eye exhibits no discharge. Right eye exhibits no nystagmus. Left eye exhibits no nystagmus.  Neck: Normal range of motion. Neck supple. No JVD present.  Cardiovascular: Normal rate, regular rhythm and normal heart sounds.   No murmur heard. Pulmonary/Chest: Effort normal and breath sounds normal. No respiratory distress. She has no wheezes. She has no rales.  Abdominal: Soft. Bowel sounds are normal. There is tenderness (generalized). There is no rebound and no guarding.  Musculoskeletal:       Cervical back: She exhibits normal range of motion, no tenderness and no bony tenderness.  Neurological: She is alert. She has normal strength. No cranial nerve deficit or sensory deficit. GCS eye subscore is 4. GCS verbal subscore is 5. GCS motor subscore is 6.  Unable to cooperate with majority of neuro exam. She has 3/5 upper and lower extremity strength. Equal bilateral grips. Sensation intact and she can wiggle toes but cannot lift or hold arms and legs off bed.   Skin: Skin is warm and dry.  Psychiatric: She has a normal mood and affect.    ED Course  Procedures (including critical care time) Labs Review Labs Reviewed  COMPREHENSIVE METABOLIC PANEL - Abnormal; Notable for the following:    Potassium 3.5 (*)    Glucose, Bld 122 (*)    GFR calc non Af Amer 85 (*)    All other components within normal limits  URINALYSIS, ROUTINE W REFLEX MICROSCOPIC - Abnormal; Notable for the following:    Hgb  urine dipstick TRACE (*)    All other components within normal limits  CBC  DIFFERENTIAL  URINE MICROSCOPIC-ADD ON  CBG MONITORING, ED  I-STAT TROPOININ, ED  I-STAT CG4 LACTIC ACID, ED    Imaging Review Dg Chest 2 View  11/01/2013   CLINICAL DATA:  Altered level of consciousness.  EXAM: CHEST  2 VIEW  COMPARISON:  Chest x-ray 09/14/2013.  FINDINGS: Lung volumes are normal. No consolidative airspace disease. No pleural effusions. No pneumothorax. No pulmonary nodule or mass noted. Pulmonary vasculature and the cardiomediastinal silhouette are within normal limits. Atherosclerosis in the thoracic aorta.  IMPRESSION: 1.  No radiographic evidence of acute cardiopulmonary disease. 2. Atherosclerosis.  Electronically Signed   By: Vinnie Langton M.D.   On: 11/01/2013 16:32   Ct Head Wo Contrast  11/01/2013   CLINICAL DATA:  Stroke 1 month ago, recently treated for urinary tract infection, altered mental status today  EXAM: CT HEAD WITHOUT CONTRAST  TECHNIQUE: Contiguous axial images were obtained from the base of the skull through the vertex without intravenous contrast.  COMPARISON:  MRI 09/17/2013, CT 09/14/2013  FINDINGS: The tiny lacunar infarct right occipital lobe seen on prior MRI is not appreciated by CT. As on prior studies, there is moderate diffuse atrophy with moderate to severe low attenuation in the deep white matter. There is no hemorrhage or extra-axial fluid. No evidence of vascular territory acute infarct. Calvarium is intact.  IMPRESSION: Chronic involutional change.  No acute findings.   Electronically Signed   By: Skipper Cliche M.D.   On: 11/01/2013 16:16     EKG Interpretation   Date/Time:  Sunday November 01 2013 15:54:37 EDT Ventricular Rate:  93 PR Interval:  160 QRS Duration: 88 QT Interval:  424 QTC Calculation: 527 R Axis:   25 Text Interpretation:  Sinus rhythm Probable left atrial enlargement  Borderline T abnormalities, inferior leads Prolonged QT interval  Confirmed  by Jeneen Rinks  MD, [place] ([id number]) on 11/01/2013 3:57:10 PM      3:32 PM Patient seen and examined. Work-up initiated. She is currently hemodynamically stable. No SIRS criteria. No focal weakness.   Vital signs reviewed and are as follows: Filed Vitals:   11/01/13 1530  BP: 164/77  Pulse: 94  Temp: 98.6 F (37 C)  Resp: 16   7:19 PM Patient's children at bedside. They have provided additional history. Patient lives alone. She has not been eating or drinking x 2 days or taking her medications. Daughter manages her medications and She has not taken her oxycodone in > 24 hours, no longer on Xanax. Today son went to see her and patient was awake but not verbally responsive. Typically she is ambulatory and talkative. When she did talk she was confused. Currently she is improved, but still somewhat confused per son. Family feels she is not safe at home currently and I agree, especially since we do not know what caused this episode. Dr. Jeneen Rinks has seen. Will admit ALOC.   7:31 PM I discussed case with Dr. Posey Pronto who will see.     MDM   Final diagnoses:  Delirium   Admit for above.     Carlisle Cater, PA-C 11/01/13 1931

## 2013-11-01 NOTE — ED Notes (Signed)
Pt responds appropriately to verbal commands. Pt states she has R arm pain and is nauseous. Pt doesn't know her last name, but knows her birthday. Pt states she has tenderness to lower abdomen when palpated.

## 2013-11-01 NOTE — ED Notes (Signed)
Pt from home via EMS- per EMS pt LSN was last pm. Pt has hx of CVA x1 month ago and has recently been tx for UTI. Pt family states that pt is normally walking around and talking, pt would not talk to anyone and "out of it." Pt has hx of anxiety and takes meds for the same. Pt oriented to place, but unable to state last name. Pt in NAD

## 2013-11-02 ENCOUNTER — Encounter (HOSPITAL_COMMUNITY): Payer: Self-pay | Admitting: *Deleted

## 2013-11-02 DIAGNOSIS — E43 Unspecified severe protein-calorie malnutrition: Secondary | ICD-10-CM | POA: Diagnosis present

## 2013-11-02 LAB — GI PATHOGEN PANEL BY PCR, STOOL
C difficile toxin A/B: NEGATIVE
CAMPYLOBACTER BY PCR: NEGATIVE
CRYPTOSPORIDIUM BY PCR: NEGATIVE
E coli (ETEC) LT/ST: NEGATIVE
E coli (STEC): NEGATIVE
E coli 0157 by PCR: NEGATIVE
G lamblia by PCR: NEGATIVE
NOROVIRUS G1/G2: NEGATIVE
ROTAVIRUS A BY PCR: NEGATIVE
Salmonella by PCR: NEGATIVE
Shigella by PCR: NEGATIVE

## 2013-11-02 LAB — COMPREHENSIVE METABOLIC PANEL
ALT: 7 U/L (ref 0–35)
AST: 14 U/L (ref 0–37)
Albumin: 3.4 g/dL — ABNORMAL LOW (ref 3.5–5.2)
Alkaline Phosphatase: 54 U/L (ref 39–117)
Anion gap: 16 — ABNORMAL HIGH (ref 5–15)
BUN: 14 mg/dL (ref 6–23)
CALCIUM: 9.1 mg/dL (ref 8.4–10.5)
CO2: 25 mEq/L (ref 19–32)
Chloride: 102 mEq/L (ref 96–112)
Creatinine, Ser: 0.63 mg/dL (ref 0.50–1.10)
GFR calc non Af Amer: 82 mL/min — ABNORMAL LOW (ref >90)
GLUCOSE: 71 mg/dL (ref 70–99)
Potassium: 3.1 mEq/L — ABNORMAL LOW (ref 3.7–5.3)
SODIUM: 143 meq/L (ref 137–147)
Total Bilirubin: 0.6 mg/dL (ref 0.3–1.2)
Total Protein: 6.1 g/dL (ref 6.0–8.3)

## 2013-11-02 LAB — PROTIME-INR
INR: 1.12 (ref 0.00–1.49)
PROTHROMBIN TIME: 14.4 s (ref 11.6–15.2)

## 2013-11-02 LAB — CBC
HCT: 37.5 % (ref 36.0–46.0)
Hemoglobin: 12.5 g/dL (ref 12.0–15.0)
MCH: 31.2 pg (ref 26.0–34.0)
MCHC: 33.3 g/dL (ref 30.0–36.0)
MCV: 93.5 fL (ref 78.0–100.0)
Platelets: 269 10*3/uL (ref 150–400)
RBC: 4.01 MIL/uL (ref 3.87–5.11)
RDW: 15.1 % (ref 11.5–15.5)
WBC: 7 10*3/uL (ref 4.0–10.5)

## 2013-11-02 LAB — FOLATE: Folate: 12.6 ng/mL

## 2013-11-02 LAB — VITAMIN B12: Vitamin B-12: 348 pg/mL (ref 211–911)

## 2013-11-02 LAB — TSH: TSH: 0.781 u[IU]/mL (ref 0.350–4.500)

## 2013-11-02 LAB — GLUCOSE, CAPILLARY: GLUCOSE-CAPILLARY: 122 mg/dL — AB (ref 70–99)

## 2013-11-02 MED ORDER — POTASSIUM CHLORIDE CRYS ER 20 MEQ PO TBCR
40.0000 meq | EXTENDED_RELEASE_TABLET | Freq: Once | ORAL | Status: AC
  Administered 2013-11-02: 40 meq via ORAL
  Filled 2013-11-02: qty 2

## 2013-11-02 MED ORDER — ADULT MULTIVITAMIN W/MINERALS CH
1.0000 | ORAL_TABLET | Freq: Every day | ORAL | Status: DC
  Administered 2013-11-02 – 2013-11-04 (×3): 1 via ORAL
  Filled 2013-11-02 (×3): qty 1

## 2013-11-02 MED ORDER — ENSURE COMPLETE PO LIQD
237.0000 mL | Freq: Three times a day (TID) | ORAL | Status: DC
  Administered 2013-11-02 – 2013-11-04 (×5): 237 mL via ORAL

## 2013-11-02 NOTE — Progress Notes (Signed)
Utilization review completed.  

## 2013-11-02 NOTE — Progress Notes (Signed)
INITIAL NUTRITION ASSESSMENT  Pt meets criteria for severe MALNUTRITION in the context of chronic illness as evidenced by 16.9% weight loss in the past month in addition to pt with severe muscle wasting and subcutaneous fat loss in arms and legs.  DOCUMENTATION CODES Per approved criteria  -Severe malnutrition in the context of chronic illness -Underweight   INTERVENTION: - Ensure Complete TID - Multivitamin 1 tablet PO daily - RD to continue to monitor   NUTRITION DIAGNOSIS: Inadequate oral intake related to altered mental status with poor appetite as evidenced by family report.   Goal: Pt to consume >90% of meals/supplements  Monitor:  Weights, labs, intake  Reason for Assessment: Malnutrition screening tool   80 y.o. female  Admitting Dx: Altered mental state  ASSESSMENT: Pt with history of COPD, anxiety, coronary artery disease, hypothyroidism, hypertension. Patient presented with complaints of confusion. Pt alone in room. Per ED notes, family reported pt was not eating/drinking x 2 days and has lost a significant amount of weight.   - Pt states her appetite has been down a long time and has lost 30 pounds unintentionally - Reports drinking 1 Ensure/day at home - Unsure of usual meal intake - Denies any problems chewing/swallowing  Potassium low, no replacement currently ordered   Nutrition Focused Physical Exam:  Subcutaneous Fat:  Orbital Region: wnl Upper Arm Region: severe wasting Thoracic and Lumbar Region: n/a  Muscle:  Temple Region: wnl Clavicle Bone Region: wnl Clavicle and Acromion Bone Region: wnl Scapular Bone Region: n/a Dorsal Hand: severe wasting Patellar Region: severe wasting Anterior Thigh Region: severe wasting Posterior Calf Region: severe wasting  Edema: None noted    Height: Ht Readings from Last 1 Encounters:  11/01/13 5\' 4"  (1.626 m)    Weight: Wt Readings from Last 1 Encounters:  11/02/13 98 lb 12.8 oz (44.815 kg)     Ideal Body Weight: 120 lbs   % Ideal Body Weight: 82%  Wt Readings from Last 10 Encounters:  11/02/13 98 lb 12.8 oz (44.815 kg)  09/14/13 118 lb 4.8 oz (53.661 kg)  09/19/12 109 lb (49.442 kg)  09/19/12 109 lb (49.442 kg)  09/11/12 107 lb (48.535 kg)  02/15/11 120 lb (54.432 kg)  02/22/10 129 lb (58.514 kg)  11/29/08 137 lb 8 oz (62.37 kg)    Usual Body Weight: 118 lbs last month  % Usual Body Weight: 83%  BMI:  Body mass index is 16.95 kg/(m^2). Underweight  Estimated Nutritional Needs: Kcal: 1500-1700 Protein: 65-80g Fluid: 1.5-1.7L/day   Skin: intact   Diet Order: Cardiac  EDUCATION NEEDS: -No education needs identified at this time   Intake/Output Summary (Last 24 hours) at 11/02/13 1359 Last data filed at 11/02/13 1251  Gross per 24 hour  Intake    795 ml  Output    150 ml  Net    645 ml    Last BM: 7/25  Labs:   Recent Labs Lab 11/01/13 1543 11/02/13 0430  NA 143 143  K 3.5* 3.1*  CL 102 102  CO2 26 25  BUN 11 14  CREATININE 0.57 0.63  CALCIUM 9.7 9.1  GLUCOSE 122* 71    CBG (last 3)   Recent Labs  11/01/13 1547  GLUCAP 122*    Scheduled Meds: . budesonide-formoterol  2 puff Inhalation BID  . busPIRone  7.5 mg Oral BID  . citalopram  20 mg Oral Daily  . clopidogrel  75 mg Oral Q breakfast  . donepezil  10 mg Oral QHS  .  enoxaparin (LOVENOX) injection  40 mg Subcutaneous Q24H  . felodipine  5 mg Oral Daily  . gabapentin  300 mg Oral QHS  . levothyroxine  100 mcg Oral QAC breakfast  . QUEtiapine  25 mg Oral QHS  . sodium chloride  3 mL Intravenous Q12H  . tiotropium  18 mcg Inhalation Daily    Continuous Infusions: . sodium chloride 75 mL/hr at 11/02/13 1126    Past Medical History  Diagnosis Date  . COPD (chronic obstructive pulmonary disease)   . Carotid artery occlusion   . Arthritis   . Anxiety     Afraid , living alone  . Myocardial infarction 1987  . Anemia   . Bilateral leg pain     for years  .  Femoral bruit   . Bronchitis, chronic   . Fall April 2014    Pt. has fallen twife in the last 2 months.   . Personal history of other diseases of digestive system     Upper GI bleed  . Generalized anxiety disorder   . Nonspecific abnormal electrocardiogram (ECG) (EKG)     Abnormal Results EKG  . Memory loss     Worsening  . Intestinal infection due to Clostridium difficile   . Colitis due to Clostridium difficile   . Hypertension   . CAD (coronary artery disease)   . Thyroid disease     Hypo-Thyroidism    Past Surgical History  Procedure Laterality Date  . Carotid endarterectomy  12/30/2008    Carlis Stable MS, RD, [place] Pager [phone] Weekend/After Hours Pager

## 2013-11-02 NOTE — Progress Notes (Signed)
[facility] PROGRESS NOTE  [name] EUM:[id number] DOB: 03/11/1931 DOA: 11/01/2013 PCP: [name], PA-C Brief narrative 80 year old with past medical history COPD, anxiety, coronary artery disease, hypothyroidism, hypertension who presented with main complaint of confusion.  Patient has had recent complaints of nausea and diarrhea  Assessment/Plan:  Principal Problem:   Altered mental state - Question whether this is related to worsening dementia and/or active infectious etiology. As currently patient could be having a viral gastroenteritis given complaints of nausea and diarrhea. - Consult psychiatry for medical competence and further evaluation - CT scan reported no acute finding. Other than confusion which has resolved no other focal neurological deficits reported.  Active Problems:   HYPOTHYROIDISM -We'll plan on obtaining TSH levels. - Continue Synthroid    Cerebral embolism with cerebral infarction - No new focal neurological deficits reported, continue home regimen patient currently on Plavix    COPD (chronic obstructive pulmonary disease) -Compensated currently we'll plan on continuing home regimen  Nausea and diarrhea - Supportive therapy - GI pathogen panel pending  Hypokalemia -K-Dur 40 mEq by mouth x1 -Reassess next a.m.    Protein-calorie malnutrition, severe - RD on board - Continue feeding supplement Ensure  Code Status: DO NOT RESUSCITATE Family Communication: Discussed with daughter at bedside Disposition Plan: Patient will need physical therapy evaluation as well as input from psychiatry. Should patient not have medical capacity to make decisions for herself then disposition would be based on her daughter who is caregiver as well as physical therapy recommendations to   Consultants:  Psychiatry  Procedures:  None  Antibiotics:  None  HPI/Subjective: The patient has no new complaints. Does not remember why she is in the  hospital  Objective: Filed Vitals:   11/02/13 1529  BP: 124/50  Pulse: 90  Temp: 98.4 F (36.9 C)  Resp: 20    Intake/Output Summary (Last 24 hours) at 11/02/13 1608 Last data filed at 11/02/13 1251  Gross per 24 hour  Intake    795 ml  Output    150 ml  Net    645 ml   Filed Weights   11/01/13 2117 11/02/13 0437  Weight: 44.8 kg (98 lb 12.3 oz) 44.815 kg (98 lb 12.8 oz)    Exam:   General:  Patient in no acute distress, alert and away  Cardiovascular: Regular rate and rhythm, no murmur  Respiratory: Clear to auscultation bilaterally no wheezes, prolonged expiratory phase  Abdomen: Soft, nondistended  Musculoskeletal: No cyanosis or   Data Reviewed: Basic Metabolic Panel:  Recent Labs Lab 11/01/13 1543 11/02/13 0430  NA 143 143  K 3.5* 3.1*  CL 102 102  CO2 26 25  GLUCOSE 122* 71  BUN 11 14  CREATININE 0.57 0.63  CALCIUM 9.7 9.1   Liver Function Tests:  Recent Labs Lab 11/01/13 1543 11/02/13 0430  AST 16 14  ALT 9 7  ALKPHOS 65 54  BILITOT 0.8 0.6  PROT 7.3 6.1  ALBUMIN 4.1 3.4*   No results found for this basename: LIPASE, AMYLASE,  in the last 168 hours No results found for this basename: AMMONIA,  in the last 168 hours CBC:  Recent Labs Lab 11/01/13 1543 11/02/13 0430  WBC 6.6 7.0  NEUTROABS 4.8  --   HGB 14.2 12.5  HCT 42.1 37.5  MCV 92.7 93.5  PLT 280 269   Cardiac Enzymes: No results found for this basename: CKTOTAL, CKMB, CKMBINDEX, TROPONINI,  in the last 168 hours BNP (last 3 results) No  results found for this basename: PROBNP,  in the last 8760 hours CBG:  Recent Labs Lab 11/01/13 1547  GLUCAP 122*    No results found for this or any previous visit (from the past 240 hour(s)).   Studies: Dg Chest 2 View  11/01/2013   CLINICAL DATA:  Altered level of consciousness.  EXAM: CHEST  2 VIEW  COMPARISON:  Chest x-ray 09/14/2013.  FINDINGS: Lung volumes are normal. No consolidative airspace disease. No pleural  effusions. No pneumothorax. No pulmonary nodule or mass noted. Pulmonary vasculature and the cardiomediastinal silhouette are within normal limits. Atherosclerosis in the thoracic aorta.  IMPRESSION: 1.  No radiographic evidence of acute cardiopulmonary disease. 2. Atherosclerosis.   Electronically Signed   By: Vinnie Langton M.D.   On: 11/01/2013 16:32   Ct Head Wo Contrast  11/01/2013   CLINICAL DATA:  Stroke 1 month ago, recently treated for urinary tract infection, altered mental status today  EXAM: CT HEAD WITHOUT CONTRAST  TECHNIQUE: Contiguous axial images were obtained from the base of the skull through the vertex without intravenous contrast.  COMPARISON:  MRI 09/17/2013, CT 09/14/2013  FINDINGS: The tiny lacunar infarct right occipital lobe seen on prior MRI is not appreciated by CT. As on prior studies, there is moderate diffuse atrophy with moderate to severe low attenuation in the deep white matter. There is no hemorrhage or extra-axial fluid. No evidence of vascular territory acute infarct. Calvarium is intact.  IMPRESSION: Chronic involutional change.  No acute findings.   Electronically Signed   By: Skipper Cliche M.D.   On: 11/01/2013 16:16   Dg Abd 2 Views  11/01/2013   CLINICAL DATA:  Abdominal pain, nausea and vomiting.  EXAM: ABDOMEN - 2 VIEW  COMPARISON:  Pelvis 11/1912  FINDINGS: The bowel gas pattern is normal. There is no evidence of free air. No radio-opaque calculi or other significant radiographic abnormality is seen. Emphysematous changes are noted in the lung bases. Vascular calcifications in the abdomen. Lumbar scoliosis and degenerative changes. Degenerative changes in the hips.  IMPRESSION: Nonobstructive bowel gas pattern.   Electronically Signed   By: Lucienne Capers M.D.   On: 11/01/2013 21:40    Scheduled Meds: . budesonide-formoterol  2 puff Inhalation BID  . busPIRone  7.5 mg Oral BID  . citalopram  20 mg Oral Daily  . clopidogrel  75 mg Oral Q breakfast  .  donepezil  10 mg Oral QHS  . enoxaparin (LOVENOX) injection  40 mg Subcutaneous Q24H  . feeding supplement (ENSURE COMPLETE)  237 mL Oral TID BM  . felodipine  5 mg Oral Daily  . gabapentin  300 mg Oral QHS  . levothyroxine  100 mcg Oral QAC breakfast  . multivitamin with minerals  1 tablet Oral Daily  . QUEtiapine  25 mg Oral QHS  . sodium chloride  3 mL Intravenous Q12H  . tiotropium  18 mcg Inhalation Daily   Continuous Infusions: . sodium chloride 75 mL/hr at 11/02/13 1126    Time spent: > 35 minutes    Velvet Bathe  [facility] Pager [phone] If 7PM-7AM, please contact night-coverage at [email or web address], password [facility] 11/02/2013, 4:08 PM  LOS: 1 day

## 2013-11-03 DIAGNOSIS — F3289 Other specified depressive episodes: Secondary | ICD-10-CM

## 2013-11-03 DIAGNOSIS — F329 Major depressive disorder, single episode, unspecified: Secondary | ICD-10-CM

## 2013-11-03 LAB — BASIC METABOLIC PANEL
Anion gap: 10 (ref 5–15)
BUN: 9 mg/dL (ref 6–23)
CALCIUM: 8.7 mg/dL (ref 8.4–10.5)
CO2: 26 mEq/L (ref 19–32)
CREATININE: 0.62 mg/dL (ref 0.50–1.10)
Chloride: 108 mEq/L (ref 96–112)
GFR calc Af Amer: >90 mL/min (ref >90)
GFR calc non Af Amer: 82 mL/min — ABNORMAL LOW (ref >90)
GLUCOSE: 87 mg/dL (ref 70–99)
Potassium: 4.3 mEq/L (ref 3.7–5.3)
Sodium: 144 mEq/L (ref 137–147)

## 2013-11-03 LAB — MAGNESIUM: Magnesium: 2.2 mg/dL (ref 1.5–2.5)

## 2013-11-03 NOTE — Progress Notes (Signed)
Utilization review completed.  

## 2013-11-03 NOTE — Progress Notes (Signed)
11/03/13 1548  PT Time Calculation  PT Start Time 1435  PT Stop Time 1446  PT Time Calculation (min) 11 min  PT G-Codes **NOT FOR INPATIENT CLASS**  Functional Assessment Tool Used clinical judgement  Functional Limitation Mobility: Walking and moving around  Mobility: Walking and Moving Around Current Status (T5573) CJ  Mobility: Walking and Moving Around Goal Status (U2025) CI  PT General Charges  $$ ACUTE PT VISIT 1 Procedure  PT Evaluation  $Initial PT Evaluation Tier I 1 Procedure  PT Treatments  $Gait Training 8-22 mins  Weston Anna, MPT [phone]

## 2013-11-03 NOTE — Consult Note (Signed)
[facility] Face-to-Face Psychiatry Consult   Reason for Consult:  Capacity evaluation Referring Physician:  Dr. Waynard Edwards R [name] is an 80 y.o. female. Total Time spent with patient: 45 minutes  Assessment: AXIS I:  Depressive Disorder NOS AXIS II:  Deferred AXIS III:   Past Medical History  Diagnosis Date  . COPD (chronic obstructive pulmonary disease)   . Carotid artery occlusion   . Arthritis   . Anxiety     Afraid , living alone  . Myocardial infarction 1987  . Anemia   . Bilateral leg pain     for years  . Femoral bruit   . Bronchitis, chronic   . Fall April 2014    Pt. has fallen twife in the last 2 months.   . Personal history of other diseases of digestive system     Upper GI bleed  . Generalized anxiety disorder   . Nonspecific abnormal electrocardiogram (ECG) (EKG)     Abnormal Results EKG  . Memory loss     Worsening  . Intestinal infection due to Clostridium difficile   . Colitis due to Clostridium difficile   . Hypertension   . CAD (coronary artery disease)   . Thyroid disease     Hypo-Thyroidism   AXIS IV:  other psychosocial or environmental problems, problems related to social environment and problems with primary support group AXIS V:  41-50 serious symptoms  Plan:  Case will be discussed with Dr. Wendee Beavers, and paged Patient has capacity to make her own medical decisions No evidence of imminent risk to self or others at present.   Patient does not meet criteria for psychiatric inpatient admission. Supportive therapy provided about ongoing stressors. Appreciate psychiatric consultation and will sign off at this time Please contact [phone] if needs further assistance  Subjective:   [name] is a 80 y.o. female patient admitted with AMS, confusion and generalized weakness.  HPI:  Patient is seen with Kaitlin Messing, LCSW for psychiatric consultation. Patient has complained being anxious and nervous but has denied symptoms of depression, suicidal or  homicidal ideations. She has been compliant with her medication and has no reported adverse effects. Patient has recent hospitalization secondary to fall and also participated in rehab treatment. Patient stated that she is not doing well since went home. Patient has presented with confusion but she has been cleared at this time of evaluation. She has good orientation, concentration and intact immediate and recall memory on formal testing. She has fairly good understanding about her health problems and her limitation and willing to participate in rehab treatment if needed or recommended by physical therapist. She believes that she may have suffered mild stroke. She was not able to care for herself and seeking help from placement.   Medical History: [name] is a 80 y.o. female with Past medical history of COPD, anxiety, coronary artery disease, hypothyroidism, hypertension. Patient presented with complaints of confusion. She was brought in by her family members the patient was recently admitted for confusion and was found to have possible accidental drug overdose. Her medications were adjusted also due to her hypothyroidism and she was in the nursing home. She had a right-sided weakness during that admission and was also found to have a CVA and she was placed on Plavix.   Review of Systems: as mentioned in the history of present illness.  A Comprehensive review of the other systems is negative.   HPI Elements:  Location:  depression and AMS. Quality:  poor. Severity:  acute. Timing:  GI disturbance.  Past Psychiatric History: Past Medical History  Diagnosis Date  . COPD (chronic obstructive pulmonary disease)   . Carotid artery occlusion   . Arthritis   . Anxiety     Afraid , living alone  . Myocardial infarction 1987  . Anemia   . Bilateral leg pain     for years  . Femoral bruit   . Bronchitis, chronic   . Fall April 2014    Pt. has fallen twife in the last 2 months.   .  Personal history of other diseases of digestive system     Upper GI bleed  . Generalized anxiety disorder   . Nonspecific abnormal electrocardiogram (ECG) (EKG)     Abnormal Results EKG  . Memory loss     Worsening  . Intestinal infection due to Clostridium difficile   . Colitis due to Clostridium difficile   . Hypertension   . CAD (coronary artery disease)   . Thyroid disease     Hypo-Thyroidism    reports that she quit smoking about 29 years ago. Her smoking use included Cigarettes. She smoked 0.00 packs per day for 15 years. She has never used smokeless tobacco. She reports that she does not drink alcohol or use illicit drugs. Family History  Problem Relation Age of Onset  . Cancer Mother   . Deep vein thrombosis Mother   . Cancer Father   . Kidney disease Father   . Heart disease Father      Living Arrangements: Alone   Abuse/Neglect [facility]) Physical Abuse: Denies Verbal Abuse: Denies Sexual Abuse: Denies Allergies:   Allergies  Allergen Reactions  . Aspirin     GI  Bleed  . Penicillins     Numbness around mouth  . Biaxin [Clarithromycin] Nausea Only  . Sulfonamide Derivatives Nausea And Vomiting  . Codeine Nausea And Vomiting  . Hydrocodone Nausea And Vomiting  . Sulfa Antibiotics Nausea And Vomiting  . Xanax [Alprazolam]     unknown    ACT Assessment Complete:  NO Objective: Blood pressure 151/70, pulse 79, temperature 98.3 F (36.8 C), temperature source Oral, resp. rate 20, height _0  (1.626 m), weight 44.8 kg (98 lb 12.3 oz), SpO2 95.00%.Body mass index is 16.94 kg/(m^2). Results for orders placed during the hospital encounter of 11/01/13 (from the past 72 hour(s))  CBC     Status: None   Collection Time    11/01/13  3:43 PM      Result Value Ref Range   WBC 6.6  4.0 - 10.5 K/uL   RBC 4.54  3.87 - 5.11 MIL/uL   Hemoglobin 14.2  12.0 - 15.0 g/dL   HCT 42.1  36.0 - 46.0 %   MCV 92.7  78.0 - 100.0 fL   MCH 31.3  26.0 - 34.0 pg   MCHC 33.7  30.0 -  36.0 g/dL   RDW 15.1  11.5 - 15.5 %   Platelets 280  150 - 400 K/uL  COMPREHENSIVE METABOLIC PANEL     Status: Abnormal   Collection Time    11/01/13  3:43 PM      Result Value Ref Range   Sodium 143  137 - 147 mEq/L   Potassium 3.5 (*) 3.7 - 5.3 mEq/L   Chloride 102  96 - 112 mEq/L   CO2 26  19 - 32 mEq/L   Glucose, Bld 122 (*) 70 - 99 mg/dL   BUN 11  6 -  23 mg/dL   Creatinine, Ser 0.57  0.50 - 1.10 mg/dL   Calcium 9.7  8.4 - 10.5 mg/dL   Total Protein 7.3  6.0 - 8.3 g/dL   Albumin 4.1  3.5 - 5.2 g/dL   AST 16  0 - 37 U/L   ALT 9  0 - 35 U/L   Alkaline Phosphatase 65  39 - 117 U/L   Total Bilirubin 0.8  0.3 - 1.2 mg/dL   GFR calc non Af Amer 85 (*) >90 mL/min   GFR calc Af Amer >90  >90 mL/min   Comment: (NOTE)     The eGFR has been calculated using the CKD EPI equation.     This calculation has not been validated in all clinical situations.     eGFR's persistently <90 mL/min signify possible Chronic Kidney     Disease.   Anion gap 15  5 - 15  DIFFERENTIAL     Status: None   Collection Time    11/01/13  3:43 PM      Result Value Ref Range   Neutrophils Relative % 72  43 - 77 %   Neutro Abs 4.8  1.7 - 7.7 K/uL   Lymphocytes Relative 21  12 - 46 %   Lymphs Abs 1.4  0.7 - 4.0 K/uL   Monocytes Relative 7  3 - 12 %   Monocytes Absolute 0.4  0.1 - 1.0 K/uL   Eosinophils Relative 0  0 - 5 %   Eosinophils Absolute 0.0  0.0 - 0.7 K/uL   Basophils Relative 0  0 - 1 %   Basophils Absolute 0.0  0.0 - 0.1 K/uL  GLUCOSE, CAPILLARY     Status: Abnormal   Collection Time    11/01/13  3:47 PM      Result Value Ref Range   Glucose-Capillary 122 (*) 70 - 99 mg/dL  I-STAT TROPOININ, ED     Status: None   Collection Time    11/01/13  3:53 PM      Result Value Ref Range   Troponin i, poc 0.00  0.00 - 0.08 ng/mL   Comment 3            Comment: Due to the release kinetics of cTnI,     a negative result within the first hours     of the onset of symptoms does not rule out      myocardial infarction with certainty.     If myocardial infarction is still suspected,     repeat the test at appropriate intervals.  I-STAT CG4 LACTIC ACID, ED     Status: None   Collection Time    11/01/13  3:56 PM      Result Value Ref Range   Lactic Acid, Venous 0.58  0.5 - 2.2 mmol/L  URINALYSIS, ROUTINE W REFLEX MICROSCOPIC     Status: Abnormal   Collection Time    11/01/13  5:31 PM      Result Value Ref Range   Color, Urine YELLOW  YELLOW   APPearance CLEAR  CLEAR   Specific Gravity, Urine 1.016  1.005 - 1.030   pH 6.0  5.0 - 8.0   Glucose, UA NEGATIVE  NEGATIVE mg/dL   Hgb urine dipstick TRACE (*) NEGATIVE   Bilirubin Urine NEGATIVE  NEGATIVE   Ketones, ur NEGATIVE  NEGATIVE mg/dL   Protein, ur NEGATIVE  NEGATIVE mg/dL   Urobilinogen, UA 0.2  0.0 - 1.0 mg/dL   Nitrite NEGATIVE  NEGATIVE   Leukocytes, UA NEGATIVE  NEGATIVE  URINE MICROSCOPIC-ADD ON     Status: None   Collection Time    11/01/13  5:31 PM      Result Value Ref Range   Squamous Epithelial / LPF RARE  RARE   WBC, UA 0-2  <3 WBC/hpf   RBC / HPF 0-2  <3 RBC/hpf  URINE RAPID DRUG SCREEN (HOSP PERFORMED)     Status: None   Collection Time    11/01/13  8:25 PM      Result Value Ref Range   Opiates NONE DETECTED  NONE DETECTED   Cocaine NONE DETECTED  NONE DETECTED   Benzodiazepines NONE DETECTED  NONE DETECTED   Amphetamines NONE DETECTED  NONE DETECTED   Tetrahydrocannabinol NONE DETECTED  NONE DETECTED   Barbiturates NONE DETECTED  NONE DETECTED   Comment:            DRUG SCREEN FOR MEDICAL PURPOSES     ONLY.  IF CONFIRMATION IS NEEDED     FOR ANY PURPOSE, NOTIFY LAB     WITHIN 5 DAYS.                LOWEST DETECTABLE LIMITS     FOR URINE DRUG SCREEN     Drug Class       Cutoff (ng/mL)     Amphetamine      1000     Barbiturate      200     Benzodiazepine   756     Tricyclics       433     Opiates          300     Cocaine          300     THC              50  GI PATHOGEN PANEL BY PCR, STOOL      Status: None   Collection Time    11/02/13  1:38 AM      Result Value Ref Range   Campylobacter by PCR Negative     C difficile toxin A/B Negative     E coli 0157 by PCR Negative     E coli (ETEC) LT/ST Negative     E coli (STEC) Negative     Salmonella by PCR Negative     Shigella by PCR Negative     Norovirus G!/G2 Negative     Rotavirus A by PCR Negative     G lamblia by PCR Negative     Cryptosporidium by PCR Negative     Comment: (NOTE)      ** Normal Reference Range for each Analyte: Not Detected **           The detection and identification of specific gastrointestinal     microbial nucleic acid from individuals exhibiting signs and     symptoms of gastrointestinal infection aids in the diagnosis     of gastrointestinal infection when used in conjunction with     clinical evaluation, laboratory findings, and epidemiological     information.     ** The xTAG Gastrointestinal Pathogen Panel results are presumptive     and must be confirmed by FDA-cleared tests or other acceptable     reference methods.  The results of this test should not be used as     the sole basis for diagnosis, treatment, or other patient management     decisions.  Performed using the [employer] xTAG Gastrointestinal Pathogen Panel test     kit.     Performed at [facility]     Status: Abnormal   Collection Time    11/02/13  4:30 AM      Result Value Ref Range   Sodium 143  137 - 147 mEq/L   Potassium 3.1 (*) 3.7 - 5.3 mEq/L   Chloride 102  96 - 112 mEq/L   CO2 25  19 - 32 mEq/L   Glucose, Bld 71  70 - 99 mg/dL   BUN 14  6 - 23 mg/dL   Creatinine, Ser 0.63  0.50 - 1.10 mg/dL   Calcium 9.1  8.4 - 10.5 mg/dL   Total Protein 6.1  6.0 - 8.3 g/dL   Albumin 3.4 (*) 3.5 - 5.2 g/dL   AST 14  0 - 37 U/L   ALT 7  0 - 35 U/L   Alkaline Phosphatase 54  39 - 117 U/L   Total Bilirubin 0.6  0.3 - 1.2 mg/dL   GFR calc non Af Amer 82 (*) >90 mL/min   GFR calc Af Amer  >90  >90 mL/min   Comment: (NOTE)     The eGFR has been calculated using the CKD EPI equation.     This calculation has not been validated in all clinical situations.     eGFR's persistently <90 mL/min signify possible Chronic Kidney     Disease.   Anion gap 16 (*) 5 - 15  CBC     Status: None   Collection Time    11/02/13  4:30 AM      Result Value Ref Range   WBC 7.0  4.0 - 10.5 K/uL   RBC 4.01  3.87 - 5.11 MIL/uL   Hemoglobin 12.5  12.0 - 15.0 g/dL   HCT 37.5  36.0 - 46.0 %   MCV 93.5  78.0 - 100.0 fL   MCH 31.2  26.0 - 34.0 pg   MCHC 33.3  30.0 - 36.0 g/dL   RDW 15.1  11.5 - 15.5 %   Platelets 269  150 - 400 K/uL  PROTIME-INR     Status: None   Collection Time    11/02/13  4:30 AM      Result Value Ref Range   Prothrombin Time 14.4  11.6 - 15.2 seconds   INR 1.12  0.00 - 1.49  VITAMIN B12     Status: None   Collection Time    11/02/13  4:30 AM      Result Value Ref Range   Vitamin B-12 348  211 - 911 pg/mL   Comment: Performed at [place]     Status: None   Collection Time    11/02/13  4:30 AM      Result Value Ref Range   Folate 12.6     Comment: (NOTE)     Reference Ranges            Deficient:       0.4 - 3.3 ng/mL            Indeterminate:   3.4 - 5.4 ng/mL            Normal:              > 5.4 ng/mL     Performed at [facility]  TSH     Status: None   Collection Time  11/02/13  5:25 PM      Result Value Ref Range   TSH 0.781  0.350 - 4.500 uIU/mL   Comment: Performed at [place]     Status: None   Collection Time    11/03/13  5:04 AM      Result Value Ref Range   Magnesium 2.2  1.5 - 2.5 mg/dL  BASIC METABOLIC PANEL     Status: Abnormal   Collection Time    11/03/13  5:06 AM      Result Value Ref Range   Sodium 144  137 - 147 mEq/L   Potassium 4.3  3.7 - 5.3 mEq/L   Comment: DELTA CHECK NOTED     REPEATED TO VERIFY     NO VISIBLE HEMOLYSIS   Chloride 108  96 - 112 mEq/L   CO2 26  19 - 32  mEq/L   Glucose, Bld 87  70 - 99 mg/dL   BUN 9  6 - 23 mg/dL   Creatinine, Ser 0.62  0.50 - 1.10 mg/dL   Calcium 8.7  8.4 - 10.5 mg/dL   GFR calc non Af Amer 82 (*) >90 mL/min   GFR calc Af Amer >90  >90 mL/min   Comment: (NOTE)     The eGFR has been calculated using the CKD EPI equation.     This calculation has not been validated in all clinical situations.     eGFR's persistently <90 mL/min signify possible Chronic Kidney     Disease.   Anion gap 10  5 - 15   Labs are reviewed and are pertinent for hypokalemia.  Current Facility-Administered Medications  Medication Dose Route Frequency Provider Last Rate Last Dose  . 0.9 %  sodium chloride infusion   Intravenous Continuous Berle Mull, MD 75 mL/hr at 11/03/13 0052    . acetaminophen (TYLENOL) tablet 650 mg  650 mg Oral Q6H PRN Berle Mull, MD       Or  . acetaminophen (TYLENOL) suppository 650 mg  650 mg Rectal Q6H PRN Berle Mull, MD      . albuterol (PROVENTIL) (2.5 MG/3ML) 0.083% nebulizer solution 2.5 mg  2.5 mg Nebulization Q4H PRN Berle Mull, MD      . budesonide-formoterol (SYMBICORT) 160-4.5 MCG/ACT inhaler 2 puff  2 puff Inhalation BID Berle Mull, MD   2 puff at 11/03/13 0756  . busPIRone (BUSPAR) tablet 7.5 mg  7.5 mg Oral BID Berle Mull, MD   7.5 mg at 11/02/13 2239  . citalopram (CELEXA) tablet 20 mg  20 mg Oral Daily Berle Mull, MD   20 mg at 11/02/13 1106  . clopidogrel (PLAVIX) tablet 75 mg  75 mg Oral Q breakfast Berle Mull, MD   75 mg at 11/03/13 0758  . donepezil (ARICEPT) tablet 10 mg  10 mg Oral QHS Berle Mull, MD   10 mg at 11/02/13 2239  . enoxaparin (LOVENOX) injection 40 mg  40 mg Subcutaneous Q24H Berle Mull, MD   40 mg at 11/02/13 2240  . feeding supplement (ENSURE COMPLETE) (ENSURE COMPLETE) liquid 237 mL  237 mL Oral TID BM Toribio Harbour, RD   237 mL at 11/02/13 2241  . felodipine (PLENDIL) 24 hr tablet 5 mg  5 mg Oral Daily Berle Mull, MD   5 mg at 11/02/13 1106  . gabapentin  (NEURONTIN) capsule 300 mg  300 mg Oral QHS Berle Mull, MD   300 mg at 11/02/13 2239  . hydrOXYzine (ATARAX/VISTARIL) tablet 25  mg  25 mg Oral QHS PRN Berle Mull, MD      . levothyroxine (SYNTHROID, LEVOTHROID) tablet 100 mcg  100 mcg Oral QAC breakfast Berle Mull, MD   100 mcg at 11/03/13 0758  . lubiprostone (AMITIZA) capsule 24 mcg  24 mcg Oral Daily PRN Berle Mull, MD      . multivitamin with minerals tablet 1 tablet  1 tablet Oral Daily Toribio Harbour, RD   1 tablet at 11/02/13 1625  . nitroGLYCERIN (NITROSTAT) SL tablet 0.4 mg  0.4 mg Sublingual Q5 min PRN Berle Mull, MD      . ondansetron (ZOFRAN) tablet 4 mg  4 mg Oral Q6H PRN Berle Mull, MD       Or  . ondansetron (ZOFRAN) injection 4 mg  4 mg Intravenous Q6H PRN Berle Mull, MD      . oxyCODONE-acetaminophen (PERCOCET/ROXICET) 5-325 MG per tablet 1 tablet  1 tablet Oral Q6H PRN Berle Mull, MD   1 tablet at 11/02/13 2239  . promethazine (PHENERGAN) tablet 6.25 mg  6.25 mg Oral Q8H PRN Berle Mull, MD      . QUEtiapine (SEROQUEL) tablet 25 mg  25 mg Oral QHS Berle Mull, MD   25 mg at 11/02/13 2239  . sodium chloride 0.9 % injection 3 mL  3 mL Intravenous Q12H Berle Mull, MD   3 mL at 11/02/13 2241  . tiotropium (SPIRIVA) inhalation capsule 18 mcg  18 mcg Inhalation Daily Berle Mull, MD   18 mcg at 11/03/13 0757    Psychiatric Specialty Exam: Physical Exam  ROS  Blood pressure 151/70, pulse 79, temperature 98.3 F (36.8 C), temperature source Oral, resp. rate 20, height _0  (1.626 m), weight 44.8 kg (98 lb 12.3 oz), SpO2 95.00%.Body mass index is 16.94 kg/(m^2).  General Appearance: Casual  Eye Contact::  Good  Speech:  Clear and Coherent  Volume:  Normal  Mood:  Anxious  Affect:  Appropriate and Congruent  Thought Process:  Coherent and Goal Directed  Orientation:  Full (Time, Place, and Person)  Thought Content:  WDL  Suicidal Thoughts:  No  Homicidal Thoughts:  No  Memory:  Immediate;    Good Recent;   Good  Judgement:  Intact  Insight:  Fair  Psychomotor Activity:  Decreased  Concentration:  Good  Recall:  Good  Fund of Knowledge:Good  Language: Good  Akathisia:  NA  Handed:  Right  AIMS (if indicated):     Assets:  Communication Skills Desire for Improvement Financial Resources/Insurance Housing Leisure Time Resilience Social Support Talents/Skills Transportation  Sleep:      Musculoskeletal: Strength & Muscle Tone: decreased Gait & Station: unable to stand Patient leans: N/A  Treatment Plan Summary: Daily contact with patient to assess and evaluate symptoms and progress in treatment Medication management Patient meets criteria of capacity to maker her own medical decisions No changes in her psychiatric medications Referred to out patient psychiatric care when medically stable and ready to be discharged Contact Kaitlin Messing LCSW if needed further assistance.  Ryo Klang,JANARDHAHA R. 11/03/2013 9:37 AM

## 2013-11-03 NOTE — Progress Notes (Signed)
Clinical Social Work Department CLINICAL SOCIAL WORK PSYCHIATRY SERVICE LINE ASSESSMENT 11/03/2013  Patient:  [name]  Account:  [id number]  Admit Date:  11/01/2013  Clinical Social Worker:  [name], LCSW  Date/Time:  11/03/2013 11:00 AM Referred by:  Physician  Date referred:  11/03/2013 Reason for Referral  Competency/Guardianship   Presenting Symptoms/Problems (In the person's/family's own words):   Psych consulted to assess for capacity.   Abuse/Neglect/Trauma History (check all that apply)  Denies history   Abuse/Neglect/Trauma Comments:   Psychiatric History (check all that apply)  Outpatient treatment   Psychiatric medications:  Buspar 7.5 mg  Celexa 20 mg  Seroquel 25 mg   Current Mental Health Hospitalizations/Previous Mental Health History:   Patient reports no previous history of mental illness. Patient reports that she has started to feel hopeless and lack of motivation. Patient reports she has not been sleeping well. Patient reports that PA prescribes all medications.   Current provider:   [facility]   Place and Date:   [place], [place]   Current Medications:   Scheduled Meds:      . budesonide-formoterol  2 puff Inhalation BID  . busPIRone  7.5 mg Oral BID  . citalopram  20 mg Oral Daily  . clopidogrel  75 mg Oral Q breakfast  . donepezil  10 mg Oral QHS  . enoxaparin (LOVENOX) injection  40 mg Subcutaneous Q24H  . feeding supplement (ENSURE COMPLETE)  237 mL Oral TID BM  . felodipine  5 mg Oral Daily  . gabapentin  300 mg Oral QHS  . levothyroxine  100 mcg Oral QAC breakfast  . multivitamin with minerals  1 tablet Oral Daily  . QUEtiapine  25 mg Oral QHS  . sodium chloride  3 mL Intravenous Q12H  . tiotropium  18 mcg Inhalation Daily        Continuous Infusions:      PRN Meds:.acetaminophen, acetaminophen, albuterol, hydrOXYzine, lubiprostone, nitroGLYCERIN, ondansetron (ZOFRAN) IV, ondansetron, oxyCODONE-acetaminophen,  promethazine       Previous Impatient Admission/Date/Reason:   None reported   Emotional Health / Current Symptoms    Suicide/Self Harm  None reported   Suicide attempt in the past:   Patient denies any SI or HI. Patient denies any previous suicide attempts.   Other harmful behavior:   None reported   Psychotic/Dissociative Symptoms  None reported   Other Psychotic/Dissociative Symptoms:    Attention/Behavioral Symptoms  Within Normal Limits   Other Attention / Behavioral Symptoms:   Patient engaged in assessment.    Cognitive Impairment  Orientation - Place  Orientation - Self  Orientation - Situation   Other Cognitive Impairment:    Mood and Adjustment  Flat    Stress, Anxiety, Trauma, Any Recent Loss/Stressor  Grief/Loss (recent or history)   Anxiety (frequency):   N/A   Phobia (specify):   N/A   Compulsive behavior (specify):   N/A   Obsessive behavior (specify):   N/A   Other:   Patient lost husband 9 years ago.   Substance Abuse/Use  None   SBIRT completed (please refer for detailed history):  N  Self-reported substance use:   Patient denies any substance use.   Urinary Drug Screen Completed:  Y Alcohol level:   Negative screening    Environmental/Housing/Living Arrangement  Stable housing   Who is in the home:   Alone   Emergency contact:  [place]  Medicare   Patient's Strengths and Goals (patient's own words):  Patient reports she has supportive family.   Clinical Social Worker's Interpretive Summary:   CSW received referral in order to complete psychosocial assessment. CSW reviewed chart and met with patient at bedside with psych MD.    Patient reports she was living at home and has three children. Patient's oldest dtr lives in [place] and patient moved here about 10 years ago. Patient was married for over 96 years and she and husband moved to [place] about 10 years ago. Patient's husband passed away about 9 years  ago. Patient reports that she misses him and has had trouble sleeping ever since husband passed away.    Patient denies any history of mental illness but is currently prescribed anti-depressant medications. Patient reports that she has lost all motivation, cannot sleep, isolates often, does not have an appetite, and does not have much energy. Patient was fairly independent until she experienced a stroke in June 2015. Patient was hospitalized and then went to SNF for rehab. Patient returned home but has not been doing well living on her own. Patient has help to clean her house and dtr manages her medications. Patient reports she is excited to sell her house and move into a facility close by her other dtr in [place].    Patient engaged during assessment and answered questions appropriately. Patient able to discuss family dynamics and plans in the future. Patient is able to remember 3 words, count from 10 to 1 backwards as well as state the days of the week backwards. Patient reports she knows she is not doing well living on her own and is agreeable for treatment. Patient agreeable for CSW to call dtr and discuss plans as well. Patient reports if PT recommends SNF then she would like to return to [facility] and is agreeable for CSW to contact facility.    CSW spoke with dtr Jackelyn Poling) who has concerns about patient's mental status. Dtr reports that patient is alert and oriented at times and then confused at other times. Dtr reports that patient has mood swings and will yell at family as well. Dtr is torn about placement but believes patient needs ST SNF and then to go to ALF. CSW allowed dtr time to discuss her feelings of not wanting to upset patient and feeling like she was abandoning patient. CSW explained that dtr is making the best decision for patient and encouraged her to rely on family for additional support.    Dtr reports that patient is not managing well at home and often stays in the bed for long periods.  Patient has not been eating and due to accidental overdose in June 2015, dtr has been managing all medications. Dtr states that patient does appear depressed and is not as involved with family as she used to be. Dtr agreeable to SNF search as well.    CSW completed FL2 and faxed out and spoke with [employer]. CSW will continue to follow.   Disposition:  Recommend Psych CSW continuing to support while in hospital   [place], [place] [phone]

## 2013-11-03 NOTE — Evaluation (Signed)
Physical Therapy Evaluation Patient Details Name: [name] MRN: [id number] DOB: 1930-06-20 Today's Date: 11/03/2013   History of Present Illness  80 yo female admitted with AMS. Hx of COPD, anxiety, HTN, CVA, hearing loss, fall, MI. Pt is from home alone.   Clinical Impression  On eval, pt required Min assist for mobility-able to ambulate ~100 feet with rolling walker. Demonstrates weakness (especially R UE), decreased activity tolerance, and impaired gait and balance. Recommend ST rehab at [facility].    Follow Up Recommendations SNF    Equipment Recommendations  None recommended by PT    Recommendations for Other Services OT consult     Precautions / Restrictions Precautions Precautions: Fall Restrictions Weight Bearing Restrictions: No      Mobility  Bed Mobility Overal bed mobility: Needs Assistance Bed Mobility: Supine to Sit;Sit to Supine     Supine to sit: Min assist Sit to supine: Min guard   General bed mobility comments: assist for trunk  Transfers Overall transfer level: Needs assistance Equipment used: Rolling walker (2 wheeled) Transfers: Sit to/from Stand Sit to Stand: Min assist         General transfer comment: assist to rise, stabilize, control descent  Ambulation/Gait Ambulation/Gait assistance: Min guard Ambulation Distance (Feet): 100 Feet Assistive device: Rolling walker (2 wheeled) Gait Pattern/deviations: Step-through pattern;Trunk flexed;Decreased stride length     General Gait Details: close guard for safety. slow gait speed.   Stairs            Wheelchair Mobility    Modified Rankin (Stroke Patients Only)       Balance                                             Pertinent Vitals/Pain Pt denies pain    Home Living Family/patient expects to be discharged to:: Private residence Living Arrangements: Alone   Type of Home: House Home Access: Level entry     [place]: [place]: Walker - 2 wheels      Prior Function Level of Independence: Independent with assistive device(s);Needs assistance   Gait / Transfers Assistance Needed: used walker  ADL's / Homemaking Assistance Needed: chores/cleaning 3x/week        Hand Dominance        Extremity/Trunk Assessment   Upper Extremity Assessment: RUE deficits/detail RUE Deficits / Details: pt reports R UE weakness. Difficulty elevating UE against gravity and grip strength decreased.         Lower Extremity Assessment: Generalized weakness      Cervical / Trunk Assessment: Kyphotic  Communication   Communication: No difficulties  Cognition Arousal/Alertness: Awake/alert Behavior During Therapy: WFL for tasks assessed/performed Overall Cognitive Status: No family/caregiver present to determine baseline cognitive functioning Area of Impairment: Memory               General Comments: some confusion noted but pt participated well with session    General Comments      Exercises        Assessment/Plan    PT Assessment Patient needs continued PT services  PT Diagnosis Difficulty walking;Generalized weakness   PT Problem List Decreased strength;Decreased range of motion;Decreased activity tolerance;Decreased balance;Decreased mobility;Decreased cognition  PT Treatment Interventions DME instruction;Gait training;Functional mobility training;Therapeutic activities;Therapeutic exercise;Patient/family education;Balance training   PT Goals (Current goals can be found in the Care Plan section) Acute Rehab  PT Goals Patient Stated Goal: to go to rehab-" I can't take care of myself like this. I live alone" PT Goal Formulation: With patient Time For Goal Achievement: 11/17/13 Potential to Achieve Goals: Good    Frequency Min 3X/week   Barriers to discharge        Co-evaluation               End of Session Equipment Utilized During Treatment: Gait belt Activity Tolerance:  Patient tolerated treatment well Patient left: in bed;with call bell/phone within reach;with bed alarm set           Time: 1435-1446 PT Time Calculation (min): 11 min   Charges:   PT Evaluation $Initial PT Evaluation Tier I: 1 Procedure PT Treatments $Gait Training: 8-22 mins   PT G Codes:          Weston Anna, MPT Pager: [phone]

## 2013-11-03 NOTE — Progress Notes (Addendum)
Clinical Social Work Department CLINICAL SOCIAL WORK PLACEMENT NOTE 11/03/2013  Patient:  [name], [name]  Account Number:  [id number] [place] date:  11/01/2013  Clinical Social Worker:  [name], LCSW  Date/time:  11/03/2013 11:00 AM  Clinical Social Work is seeking post-discharge placement for this patient at the following level of care:   [place]   (*CSW will update this form in Epic as items are completed)   11/03/2013  Patient/family provided with [place] Department of Clinical Social Work's list of facilities offering this level of care within the geographic area requested by the patient (or if unable, by the patient's family).  11/03/2013  Patient/family informed of their freedom to choose among providers that offer the needed level of care, that participate in Medicare, Medicaid or managed care program needed by the patient, have an available bed and are willing to accept the patient.  11/03/2013  Patient/family informed of [facility]' ownership interest in [facility], as well as of the fact that they are under no obligation to receive care at this facility.  PASARR submitted to EDS on existing # PASARR number received on   FL2 transmitted to all facilities in geographic area requested by pt/family on  11/03/2013 FL2 transmitted to all facilities within larger geographic area on   Patient informed that his/her managed care company has contracts with or will negotiate with  certain facilities, including the following:     Patient/family informed of bed offers received:  11/03/13 Patient chooses bed at [facility] recommends and patient [facility] bed [facility]    [facility]   11/04/13 Patient to be transferred to facility by [facility] Patient and family notified of transfer on 11/04/13 Name of family member notified:  Dtr Jackelyn Poling) via phone  The following physician request were entered in Epic:   Additional  Comments:

## 2013-11-03 NOTE — Progress Notes (Signed)
[facility] PROGRESS NOTE  [name] POE:[id number] DOB: 09-21-1930 DOA: 11/01/2013 PCP: [name], PA-C Brief narrative 80 year old with past medical history COPD, anxiety, coronary artery disease, hypothyroidism, hypertension who presented with main complaint of confusion.  Patient has had recent complaints of nausea and diarrhea  Assessment/Plan:  Principal Problem:   Altered mental state- toxic encephalopathy?  - Question whether this is related to worsening dementia and/or active infectious etiology. As currently patient could be having a viral gastroenteritis given complaints of nausea and diarrhea. - Consult psychiatry consult requested by family for competency per pych, she has competency to make decisions - CT scan reported no acute finding. Other than confusion which has resolved no other focal neurological deficits reported.  Active Problems:   HYPOTHYROIDISM - TSH normal - Continue Synthroid    Cerebral embolism with cerebral infarction - No new focal neurological deficits reported, continue home regimen patient currently on Plavix    COPD (chronic obstructive pulmonary disease) -Compensated currently we'll plan on continuing home regimen  Nausea and diarrhea - Supportive therapy - GI pathogen panel negative  Hypokalemia -replaced appropriately    Protein-calorie malnutrition, severe - RD on board - Continue feeding supplement Ensure  Code Status: DO NOT RESUSCITATE Family Communication: Discussed with daughter at bedside Disposition Plan:  Awaiting PT eval   Consultants:  Psychiatry  Procedures:  None  Antibiotics:  None  HPI/Subjective: Sitting up in a chair for the first time today. Feels a knot under her chin. Feels right arm is weak but is able to lift and move it normally on exam. Thinks she was brought to the hospital due to a fall on her right arm.   Objective: Filed Vitals:   11/03/13 1422  BP: 112/50  Pulse: 88  Temp:  98.2 F (36.8 C)  Resp: 18    Intake/Output Summary (Last 24 hours) at 11/03/13 1430 Last data filed at 11/03/13 0651  Gross per 24 hour  Intake   1795 ml  Output    950 ml  Net    845 ml   Filed Weights   11/01/13 2117 11/02/13 0437 11/03/13 0500  Weight: 44.8 kg (98 lb 12.3 oz) 44.815 kg (98 lb 12.8 oz) 44.8 kg (98 lb 12.3 oz)    Exam:   General:  Patient in no acute distress, alert and awake  Neck: no nodules or lymphadenopathy  Cardiovascular: Regular rate and rhythm, no murmur  Respiratory: Clear to auscultation bilaterally no wheezes  Abdomen: Soft, nondistended  Musculoskeletal: No pain in right arm - normal ROM  Data Reviewed: Basic Metabolic Panel:  Recent Labs Lab 11/01/13 1543 11/02/13 0430 11/03/13 0504 11/03/13 0506  NA 143 143  --  144  K 3.5* 3.1*  --  4.3  CL 102 102  --  108  CO2 26 25  --  26  GLUCOSE 122* 71  --  87  BUN 11 14  --  9  CREATININE 0.57 0.63  --  0.62  CALCIUM 9.7 9.1  --  8.7  MG  --   --  2.2  --    Liver Function Tests:  Recent Labs Lab 11/01/13 1543 11/02/13 0430  AST 16 14  ALT 9 7  ALKPHOS 65 54  BILITOT 0.8 0.6  PROT 7.3 6.1  ALBUMIN 4.1 3.4*   No results found for this basename: LIPASE, AMYLASE,  in the last 168 hours No results found for this basename: AMMONIA,  in the last 168 hours CBC:  Recent Labs Lab 11/01/13 1543 11/02/13 0430  WBC 6.6 7.0  NEUTROABS 4.8  --   HGB 14.2 12.5  HCT 42.1 37.5  MCV 92.7 93.5  PLT 280 269   Cardiac Enzymes: No results found for this basename: CKTOTAL, CKMB, CKMBINDEX, TROPONINI,  in the last 168 hours BNP (last 3 results) No results found for this basename: PROBNP,  in the last 8760 hours CBG:  Recent Labs Lab 11/01/13 1547  GLUCAP 122*    No results found for this or any previous visit (from the past 240 hour(s)).   Studies: Dg Chest 2 View  11/01/2013   CLINICAL DATA:  Altered level of consciousness.  EXAM: CHEST  2 VIEW  COMPARISON:  Chest x-ray  09/14/2013.  FINDINGS: Lung volumes are normal. No consolidative airspace disease. No pleural effusions. No pneumothorax. No pulmonary nodule or mass noted. Pulmonary vasculature and the cardiomediastinal silhouette are within normal limits. Atherosclerosis in the thoracic aorta.  IMPRESSION: 1.  No radiographic evidence of acute cardiopulmonary disease. 2. Atherosclerosis.   Electronically Signed   By: Vinnie Langton M.D.   On: 11/01/2013 16:32   Ct Head Wo Contrast  11/01/2013   CLINICAL DATA:  Stroke 1 month ago, recently treated for urinary tract infection, altered mental status today  EXAM: CT HEAD WITHOUT CONTRAST  TECHNIQUE: Contiguous axial images were obtained from the base of the skull through the vertex without intravenous contrast.  COMPARISON:  MRI 09/17/2013, CT 09/14/2013  FINDINGS: The tiny lacunar infarct right occipital lobe seen on prior MRI is not appreciated by CT. As on prior studies, there is moderate diffuse atrophy with moderate to severe low attenuation in the deep white matter. There is no hemorrhage or extra-axial fluid. No evidence of vascular territory acute infarct. Calvarium is intact.  IMPRESSION: Chronic involutional change.  No acute findings.   Electronically Signed   By: Skipper Cliche M.D.   On: 11/01/2013 16:16   Dg Abd 2 Views  11/01/2013   CLINICAL DATA:  Abdominal pain, nausea and vomiting.  EXAM: ABDOMEN - 2 VIEW  COMPARISON:  Pelvis 11/1912  FINDINGS: The bowel gas pattern is normal. There is no evidence of free air. No radio-opaque calculi or other significant radiographic abnormality is seen. Emphysematous changes are noted in the lung bases. Vascular calcifications in the abdomen. Lumbar scoliosis and degenerative changes. Degenerative changes in the hips.  IMPRESSION: Nonobstructive bowel gas pattern.   Electronically Signed   By: Lucienne Capers M.D.   On: 11/01/2013 21:40    Scheduled Meds: . budesonide-formoterol  2 puff Inhalation BID  . busPIRone   7.5 mg Oral BID  . citalopram  20 mg Oral Daily  . clopidogrel  75 mg Oral Q breakfast  . donepezil  10 mg Oral QHS  . enoxaparin (LOVENOX) injection  40 mg Subcutaneous Q24H  . feeding supplement (ENSURE COMPLETE)  237 mL Oral TID BM  . felodipine  5 mg Oral Daily  . gabapentin  300 mg Oral QHS  . levothyroxine  100 mcg Oral QAC breakfast  . multivitamin with minerals  1 tablet Oral Daily  . QUEtiapine  25 mg Oral QHS  . sodium chloride  3 mL Intravenous Q12H  . tiotropium  18 mcg Inhalation Daily   Continuous Infusions:    Time spent:  35 minutes    [facility]  [email or web address], password [facility] 11/03/2013, 2:30 PM  LOS: 2 days

## 2013-11-04 DIAGNOSIS — G9340 Encephalopathy, unspecified: Secondary | ICD-10-CM

## 2013-11-04 DIAGNOSIS — E43 Unspecified severe protein-calorie malnutrition: Secondary | ICD-10-CM

## 2013-11-04 DIAGNOSIS — K5289 Other specified noninfective gastroenteritis and colitis: Secondary | ICD-10-CM

## 2013-11-04 DIAGNOSIS — K529 Noninfective gastroenteritis and colitis, unspecified: Secondary | ICD-10-CM | POA: Diagnosis present

## 2013-11-04 DIAGNOSIS — I6340 Cerebral infarction due to embolism of unspecified cerebral artery: Secondary | ICD-10-CM

## 2013-11-04 MED ORDER — OXYCODONE-ACETAMINOPHEN 5-325 MG PO TABS: 1.0000 | ORAL_TABLET | Freq: Four times a day (QID) | ORAL | Status: DC | PRN

## 2013-11-04 MED ORDER — TIOTROPIUM BROMIDE MONOHYDRATE 18 MCG IN CAPS: 18.0000 ug | ORAL_CAPSULE | Freq: Every day | RESPIRATORY_TRACT | Status: DC

## 2013-11-04 MED ORDER — ENSURE COMPLETE PO LIQD: 237.0000 mL | Freq: Three times a day (TID) | ORAL | Status: DC

## 2013-11-04 NOTE — Care Management Note (Unsigned)
    Page 1 of 1   11/04/2013     12:07:46 PM CARE MANAGEMENT NOTE 11/04/2013  Patient:  [name], [name]   Account Number:  [id number]  Date Initiated:  11/04/2013  Documentation initiated by:  [facility]  Subjective/Objective Assessment:   80 year old female admitted with AMS.     Action/Plan:   From home, plan SNF at d/c.   Anticipated DC Date:  11/04/2013   Anticipated DC Plan:  SKILLED NURSING FACILITY  In-house referral  Clinical Social Worker      DC Planning Services  CM consult      Choice offered to / List presented to:             Status of service:  In process, will continue to follow Medicare Important Message given?  YES (If response is "NO", the following Medicare IM given date fields will be blank) Date Medicare IM given:  11/04/2013 Medicare IM given by:  [facility] Date Additional Medicare IM given:   Additional Medicare IM given by:    Discharge Disposition:    Per UR Regulation:  Reviewed for med. necessity/level of care/duration of stay  If discussed at [place] of Stay Meetings, dates discussed:    Comments:

## 2013-11-04 NOTE — Progress Notes (Signed)
Utilization Review Completed.Donne Anon T7/29/2015

## 2013-11-04 NOTE — Discharge Summary (Signed)
Physician Discharge Summary  [name] ENI:[id number] DOB: 1930-09-24 DOA: 11/01/2013  PCP: [name], PA-C  Admit date: 11/01/2013 Discharge date: 11/04/2013  Time spent: 65 minutes  Recommendations for Outpatient Follow-up:  1. Patient be discharged to [facility] from skilled nursing facility. Patient will need to followup with M.D. at the skilled nursing facility in 1 week.  Discharge Diagnoses:  Principal Problem:   Acute encephalopathy Active Problems:   Noninfectious gastroenteritis and colitis   HYPOTHYROIDISM   Cerebral embolism with cerebral infarction   Delirium   Altered mental state   COPD (chronic obstructive pulmonary disease)   Chronic hypoxemic respiratory failure   Protein-calorie malnutrition, severe   Discharge Condition: Stable and improved  Diet recommendation: Heart healthy  Filed Weights   11/02/13 0437 11/03/13 0500 11/04/13 0626  Weight: 44.815 kg (98 lb 12.8 oz) 44.8 kg (98 lb 12.3 oz) 44.8 kg (98 lb 12.3 oz)    History of present illness:  [name] is a 80 y.o. female with Past medical history of COPD, anxiety, coronary artery disease, hypothyroidism, hypertension.  Patient presented with complaints of confusion. She was brought in by her family members the patient was recently admitted for confusion and was found to have possible accidental drug overdose. Her medications were adjusted also due to her hypothyroidism and she was in the nursing home. She had a right-sided weakness during that admission and was also found to have a CVA and she was placed on Plavix.  Today when her son went to check on her she was found confused and disoriented with generalized weakness and at her baseline she is fairly active and talkative.  Patient did not remember anything that happened on day of admission. She mentioned the last thing that she remembered was going to sleep the night prior to admission. She denied any dizziness or lightheadedness or any  blurring of her vision she denied any vertigo.  She mentioned she had episodes of nausea vomiting with diarrhea the night prior to admission, but no diarrhea on day of admission and she denied any nausea at the time of evaluation.  She complained of being generalized weakness and tiredness which is marked from her baseline. She denied any chest pain, cough, fever, chills, abdominal pain burning urination. She had lost significant weight in last few months per admitting MD.  On 6/14 109 lbs, 118lbs 6/15, 98 lbs today.  The patient is coming from home. And at her baseline independent for most of her ADL.   Hospital Course:  #1 acute encephalopathy Patient had presented with confusion and generalized weakness and then had some episodes of nausea vomiting and diarrhea prior to admission. CT of the head which was done on admission was negative. Patient had no signs or symptoms of infection. Urinalysis which was done was negative. Chest x-ray which was done was also negative as well as acute abdominal series. Patient was hydrated with IV fluids and followed. Patient did not have any further diarrhea during patient improved clinically and was back to baseline by day of discharge. Patient be discharged in stable and improved condition.  #2 probable viral gastroenteritis Patient presented with nausea diarrhea and episode of emesis prior to admission. It was felt this was likely secondary to a gastroenteritis. Abdominal x-rays which were done were unremarkable. A GI pathogen panel was done which was unremarkable. C. difficile was also negative. Patient was hydrated with IV fluids and maintained on supportive care. Patient did not have any further diarrhea nausea  or vomiting during the hospitalization was back to baseline by day of discharge. Patient be discharged in stable and improved condition.  #3 hypothyroidism TSH was checked which was within normal limits. Patient was maintained on a home regimen of  Synthroid.  #4 history of CVA Remained stable. Patient was maintained on Plavix for secondary stroke prevention. Patient was seen by physical therapy during the hospitalization.   #5  Hypokalemia This was repleted.  #6 severe protein calorie malnutrition Patient was seen by dietitian during the hospitalization. Patient was maintained on ensure.  The rest of patient's chronic medical issues remain stable throughout the edition the patient be discharged in stable and improved condition.  Procedures:  CT Head 11/01/13  Abd xray 11/01/13  CXR 11/01/13  Consultations:  Psychiatry : Dr. Louretta Shorten 11/03/13  Discharge Exam: Filed Vitals:   11/04/13 0626  BP: 164/74  Pulse: 80  Temp: 98.2 F (36.8 C)  Resp: 18    General: NAD Cardiovascular: RRR Respiratory: CTAB  Discharge Instructions You were cared for by a hospitalist during your hospital stay. If you have any questions about your discharge medications or the care you received while you were in the hospital after you are discharged, you can call the unit and asked to speak with the hospitalist on call if the hospitalist that took care of you is not available. Once you are discharged, your primary care physician will handle any further medical issues. Please note that NO REFILLS for any discharge medications will be authorized once you are discharged, as it is imperative that you return to your primary care physician (or establish a relationship with a primary care physician if you do not have one) for your aftercare needs so that they can reassess your need for medications and monitor your lab values.      Discharge Instructions   Diet - low sodium heart healthy    Complete by:  As directed      Discharge instructions    Complete by:  As directed   Follow up with MD at SNF     Increase activity slowly    Complete by:  As directed             Medication List         AMITIZA 24 MCG capsule  Generic drug:   lubiprostone  Take 24 mcg by mouth daily as needed for constipation.     budesonide-formoterol 160-4.5 MCG/ACT inhaler  Commonly known as:  SYMBICORT  Inhale 2 puffs into the lungs 2 (two) times daily as needed (for shortness of breath).     busPIRone 7.5 MG tablet  Commonly known as:  BUSPAR  Take 7.5 mg by mouth 2 (two) times daily.     citalopram 20 MG tablet  Commonly known as:  CELEXA  Take 20 mg by mouth daily.     clopidogrel 75 MG tablet  Commonly known as:  PLAVIX  Take 1 tablet (75 mg total) by mouth daily with breakfast.     donepezil 10 MG tablet  Commonly known as:  ARICEPT  Take 10 mg by mouth at bedtime.     feeding supplement (ENSURE COMPLETE) Liqd  Take 237 mLs by mouth 3 (three) times daily between meals.     felodipine 5 MG 24 hr tablet  Commonly known as:  PLENDIL  Take 5 mg by mouth daily.     gabapentin 300 MG capsule  Commonly known as:  NEURONTIN  Take 300 mg by mouth  at bedtime.     hydrOXYzine 25 MG tablet  Commonly known as:  ATARAX/VISTARIL  Take 25 mg by mouth at bedtime as needed for anxiety.     levothyroxine 100 MCG tablet  Commonly known as:  SYNTHROID, LEVOTHROID  Take 100 mcg by mouth daily before breakfast.     nitroGLYCERIN 0.4 MG SL tablet  Commonly known as:  NITROSTAT  Place 0.4 mg under the tongue every 5 (five) minutes as needed for chest pain.     oxyCODONE-acetaminophen 5-325 MG per tablet  Commonly known as:  PERCOCET/ROXICET  Take 1 tablet by mouth every 6 (six) hours as needed. For pain     potassium chloride 10 MEQ CR capsule  Commonly known as:  MICRO-K  Take 10 mEq by mouth daily.     promethazine 12.5 MG tablet  Commonly known as:  PHENERGAN  Take 6.25 mg by mouth every 6 (six) hours as needed for nausea or vomiting.     QUEtiapine 25 MG tablet  Commonly known as:  SEROQUEL  Take 25 mg by mouth at bedtime.     senna-docusate 8.6-50 MG per tablet  Commonly known as:  Senokot-S  Take 1 tablet by mouth 2  (two) times daily.     tiotropium 18 MCG inhalation capsule  Commonly known as:  SPIRIVA  Place 1 capsule (18 mcg total) into inhaler and inhale daily.       Allergies  Allergen Reactions  . Aspirin     GI  Bleed  . Penicillins     Numbness around mouth  . Biaxin [Clarithromycin] Nausea Only  . Sulfonamide Derivatives Nausea And Vomiting  . Codeine Nausea And Vomiting  . Hydrocodone Nausea And Vomiting  . Sulfa Antibiotics Nausea And Vomiting  . Xanax [Alprazolam]     unknown   Follow-up Information   Follow up In 1 week. (Followup with M.D. at the skilled nursing facility)        The results of significant diagnostics from this hospitalization (including imaging, microbiology, ancillary and laboratory) are listed [place] for reference.    Significant Diagnostic Studies: Dg Chest 2 View  11/01/2013   CLINICAL DATA:  Altered level of consciousness.  EXAM: CHEST  2 VIEW  COMPARISON:  Chest x-ray 09/14/2013.  FINDINGS: Lung volumes are normal. No consolidative airspace disease. No pleural effusions. No pneumothorax. No pulmonary nodule or mass noted. Pulmonary vasculature and the cardiomediastinal silhouette are within normal limits. Atherosclerosis in the thoracic aorta.  IMPRESSION: 1.  No radiographic evidence of acute cardiopulmonary disease. 2. Atherosclerosis.   Electronically Signed   By: Vinnie Langton M.D.   On: 11/01/2013 16:32   Ct Head Wo Contrast  11/01/2013   CLINICAL DATA:  Stroke 1 month ago, recently treated for urinary tract infection, altered mental status today  EXAM: CT HEAD WITHOUT CONTRAST  TECHNIQUE: Contiguous axial images were obtained from the base of the skull through the vertex without intravenous contrast.  COMPARISON:  MRI 09/17/2013, CT 09/14/2013  FINDINGS: The tiny lacunar infarct right occipital lobe seen on prior MRI is not appreciated by CT. As on prior studies, there is moderate diffuse atrophy with moderate to severe low attenuation in the deep  white matter. There is no hemorrhage or extra-axial fluid. No evidence of vascular territory acute infarct. Calvarium is intact.  IMPRESSION: Chronic involutional change.  No acute findings.   Electronically Signed   By: Skipper Cliche M.D.   On: 11/01/2013 16:16   Dg Abd 2 Views  11/01/2013   CLINICAL DATA:  Abdominal pain, nausea and vomiting.  EXAM: ABDOMEN - 2 VIEW  COMPARISON:  Pelvis 11/1912  FINDINGS: The bowel gas pattern is normal. There is no evidence of free air. No radio-opaque calculi or other significant radiographic abnormality is seen. Emphysematous changes are noted in the lung bases. Vascular calcifications in the abdomen. Lumbar scoliosis and degenerative changes. Degenerative changes in the hips.  IMPRESSION: Nonobstructive bowel gas pattern.   Electronically Signed   By: Lucienne Capers M.D.   On: 11/01/2013 21:40    Microbiology: No results found for this or any previous visit (from the past 240 hour(s)).   Labs: Basic Metabolic Panel:  Recent Labs Lab 11/01/13 1543 11/02/13 0430 11/03/13 0504 11/03/13 0506  NA 143 143  --  144  K 3.5* 3.1*  --  4.3  CL 102 102  --  108  CO2 26 25  --  26  GLUCOSE 122* 71  --  87  BUN 11 14  --  9  CREATININE 0.57 0.63  --  0.62  CALCIUM 9.7 9.1  --  8.7  MG  --   --  2.2  --    Liver Function Tests:  Recent Labs Lab 11/01/13 1543 11/02/13 0430  AST 16 14  ALT 9 7  ALKPHOS 65 54  BILITOT 0.8 0.6  PROT 7.3 6.1  ALBUMIN 4.1 3.4*   No results found for this basename: LIPASE, AMYLASE,  in the last 168 hours No results found for this basename: AMMONIA,  in the last 168 hours CBC:  Recent Labs Lab 11/01/13 1543 11/02/13 0430  WBC 6.6 7.0  NEUTROABS 4.8  --   HGB 14.2 12.5  HCT 42.1 37.5  MCV 92.7 93.5  PLT 280 269   Cardiac Enzymes: No results found for this basename: CKTOTAL, CKMB, CKMBINDEX, TROPONINI,  in the last 168 hours BNP: BNP (last 3 results) No results found for this basename: PROBNP,  in the  last 8760 hours CBG:  Recent Labs Lab 11/01/13 1547  GLUCAP 122*       Signed:  Shalie Schremp MD [facility] 11/04/2013, 2:34 PM

## 2013-11-04 NOTE — Progress Notes (Signed)
Clinical Social Work  CSW faxed DC summary to [facility] who is agreeable to accept patient today. CSW prepared DC packet with FL2, DNR, and hard scripts included. CSW informed patient and dtr of DC plans. Patient and dtr agreeable to [facility] to provide transportation and is aware of no guarantee of payment. RN to call report to SNF. [facility] #:[id number]  CSW is signing off but available if further needs arise.  [place], [place] [phone]

## 2013-11-04 NOTE — Progress Notes (Signed)
Patient discharge to [facility], alert and oriented, discharge package given to Medical Transportation, patient in stable condition at this time

## 2013-11-04 NOTE — Progress Notes (Signed)
Physical Therapy Treatment Patient Details Name: [name] MRN: [id number] DOB: 1930/09/09 Today's Date: 11/04/2013    History of Present Illness 80 yo female admitted with AMS. Hx of COPD, anxiety, HTN, CVA, hearing loss, fall, MI. Pt is from home alone.     PT Comments    Pt OOB in recliner.  Amb in hallway twice with one sitting rest break.  RA 96% and avg HR 100.  Follow Up Recommendations  SNF ([facility])     Equipment Recommendations       Recommendations for Other Services       Precautions / Restrictions Precautions Precautions: Fall Restrictions Weight Bearing Restrictions: No    Mobility  Bed Mobility               General bed mobility comments: Pt OOB in recliner  Transfers Overall transfer level: Needs assistance Equipment used: Rolling walker (2 wheeled) Transfers: Sit to/from Stand Sit to Stand: Min guard         General transfer comment: assist to rise, stabilize, control descent  Ambulation/Gait Ambulation/Gait assistance: Min guard Ambulation Distance (Feet): 180 Feet (60 feet x 2 one sitting rest break) Assistive device: Rolling walker (2 wheeled) Gait Pattern/deviations: Step-through pattern;Decreased stride length;Trunk flexed Gait velocity: decreased   General Gait Details: close guard for safety. slow gait speed.    Stairs            Wheelchair Mobility    Modified Rankin (Stroke Patients Only)       Balance                                    Cognition                            Exercises      General Comments        Pertinent Vitals/Pain     Home Living                      Prior Function            PT Goals (current goals can now be found in the care plan section) Progress towards PT goals: Progressing toward goals    Frequency       PT Plan      Co-evaluation             End of Session Equipment Utilized During Treatment: Gait  belt Activity Tolerance: Patient tolerated treatment well Patient left: in chair;with call bell/phone within reach     Time: 1110-1136 PT Time Calculation (min): 26 min  Charges:  $Gait Training: 8-22 mins $Therapeutic Activity: 8-22 mins                    G Codes:      Rica Koyanagi  PTA [facility]  Acute  Rehab Pager      [phone]

## 2013-11-06 ENCOUNTER — Non-Acute Institutional Stay (SKILLED_NURSING_FACILITY): Payer: Medicare Other | Admitting: Internal Medicine

## 2013-11-06 ENCOUNTER — Encounter: Payer: Self-pay | Admitting: Internal Medicine

## 2013-11-06 DIAGNOSIS — K5289 Other specified noninfective gastroenteritis and colitis: Secondary | ICD-10-CM

## 2013-11-06 DIAGNOSIS — G9340 Encephalopathy, unspecified: Secondary | ICD-10-CM

## 2013-11-06 DIAGNOSIS — E039 Hypothyroidism, unspecified: Secondary | ICD-10-CM

## 2013-11-06 DIAGNOSIS — E43 Unspecified severe protein-calorie malnutrition: Secondary | ICD-10-CM

## 2013-11-06 DIAGNOSIS — I6340 Cerebral infarction due to embolism of unspecified cerebral artery: Secondary | ICD-10-CM

## 2013-11-06 NOTE — Progress Notes (Signed)
MRN: [id number] Name: [name]  Sex: female Age: 80 y.o. DOB: 1930-11-11  [facility] #: [name] Facility/Room: 110 Level Of Care: SNF Provider: Inocencio Homes D Emergency Contacts: Extended Emergency Contact Information Primary Emergency Contact: [name] [address] [address]nette Litter of [place] Phone: [phone] Work Phone: [phone] Mobile Phone: [phone] Relation: Daughter Secondary Emergency Contact: [name] [place] of [place] Phone: [phone] Relation: Neighbor  Code Status: DNR  Allergies: Aspirin; Penicillins; Biaxin; Sulfonamide derivatives; Codeine; Hydrocodone; Sulfa antibiotics; and Xanax  Chief Complaint  Patient presents with  . nursing home admission    HPI: Patient is 80 y.o. female who  Past Medical History  Diagnosis Date  . COPD (chronic obstructive pulmonary disease)   . Carotid artery occlusion   . Arthritis   . Anxiety     Afraid , living alone  . Myocardial infarction 1987  . Anemia   . Bilateral leg pain     for years  . Femoral bruit   . Bronchitis, chronic   . Fall April 2014    Pt. has fallen twife in the last 2 months.   . Personal history of other diseases of digestive system     Upper GI bleed  . Generalized anxiety disorder   . Nonspecific abnormal electrocardiogram (ECG) (EKG)     Abnormal Results EKG  . Memory loss     Worsening  . Intestinal infection due to Clostridium difficile   . Colitis due to Clostridium difficile   . Hypertension   . CAD (coronary artery disease)   . Thyroid disease     Hypo-Thyroidism    Past Surgical History  Procedure Laterality Date  . Carotid endarterectomy  12/30/2008      Medication List       This list is accurate as of: 11/06/13  8:27 PM.  Always use your most recent med list.               AMITIZA 24 MCG capsule  Generic drug:  lubiprostone  Take 24 mcg by mouth daily as needed for constipation.     budesonide-formoterol  160-4.5 MCG/ACT inhaler  Commonly known as:  SYMBICORT  Inhale 2 puffs into the lungs 2 (two) times daily as needed (for shortness of breath).     busPIRone 7.5 MG tablet  Commonly known as:  BUSPAR  Take 7.5 mg by mouth 2 (two) times daily.     citalopram 20 MG tablet  Commonly known as:  CELEXA  Take 20 mg by mouth daily.     clopidogrel 75 MG tablet  Commonly known as:  PLAVIX  Take 1 tablet (75 mg total) by mouth daily with breakfast.     donepezil 10 MG tablet  Commonly known as:  ARICEPT  Take 10 mg by mouth at bedtime.     feeding supplement (ENSURE COMPLETE) Liqd  Take 237 mLs by mouth 3 (three) times daily between meals.     felodipine 5 MG 24 hr tablet  Commonly known as:  PLENDIL  Take 5 mg by mouth daily.     gabapentin 300 MG capsule  Commonly known as:  NEURONTIN  Take 300 mg by mouth at bedtime.     hydrOXYzine 25 MG tablet  Commonly known as:  ATARAX/VISTARIL  Take 25 mg by mouth at bedtime as needed for anxiety.     levothyroxine 100 MCG tablet  Commonly known as:  SYNTHROID, LEVOTHROID  Take  100 mcg by mouth daily before breakfast.     nitroGLYCERIN 0.4 MG SL tablet  Commonly known as:  NITROSTAT  Place 0.4 mg under the tongue every 5 (five) minutes as needed for chest pain.     oxyCODONE-acetaminophen 5-325 MG per tablet  Commonly known as:  PERCOCET/ROXICET  Take 1 tablet by mouth every 6 (six) hours as needed. For pain     potassium chloride 10 MEQ CR capsule  Commonly known as:  MICRO-K  Take 10 mEq by mouth daily.     promethazine 12.5 MG tablet  Commonly known as:  PHENERGAN  Take 6.25 mg by mouth every 6 (six) hours as needed for nausea or vomiting.     QUEtiapine 25 MG tablet  Commonly known as:  SEROQUEL  Take 25 mg by mouth at bedtime.     senna-docusate 8.6-50 MG per tablet  Commonly known as:  Senokot-S  Take 1 tablet by mouth 2 (two) times daily.     tiotropium 18 MCG inhalation capsule  Commonly known as:  SPIRIVA  Place  1 capsule (18 mcg total) into inhaler and inhale daily.        No orders of the defined types were placed in this encounter.    Immunization History  Administered Date(s) Administered  . Influenza Whole 01/07/2010    History  Substance Use Topics  . Smoking status: Former Smoker -- 15 years    Types: Cigarettes    Quit date: 04/09/1984  . Smokeless tobacco: Never Used  . Alcohol Use: No    Family history is noncontributory    Review of Systems  DATA OBTAINED: from patient; no c/o GENERAL: Feels well no fevers, fatigue, appetite changes SKIN: No itching, rash or wounds EYES: No eye pain, redness, discharge EARS: No earache, tinnitus, change in hearing NOSE: No congestion, drainage or bleeding  MOUTH/THROAT: No mouth or tooth pain, No sore throat RESPIRATORY: No cough, wheezing, SOB CARDIAC: No chest pain, palpitations, lower extremity edema  GI: No abdominal pain, No N/V/D or constipation, No heartburn or reflux  GU: No dysuria, frequency or urgency, or incontinence  MUSCULOSKELETAL: No unrelieved bone/joint pain NEUROLOGIC: No headache, dizziness or focal weakness PSYCHIATRIC: No overt anxiety or sadness. Sleeps well. No behavior issue.   Filed Vitals:   11/06/13 1223  BP: 164/83  Pulse: 107  Temp: 98.4 F (36.9 C)  Resp: 20    Physical Exam  GENERAL APPEARANCE: Alert, conversant. Appropriately groomed. No acute distress.  SKIN: No diaphoresis rash, or wounds HEAD: Normocephalic, atraumatic  EYES: Conjunctiva/lids clear. Pupils round, reactive. EOMs intact.  EARS: External exam WNL, canals clear. Hearing grossly normal.  NOSE: No deformity or discharge.  MOUTH/THROAT: Lips w/o lesions. RESPIRATORY: Breathing is even, unlabored. Lung sounds are clear   CARDIOVASCULAR: Heart RRR no murmurs, rubs or gallops. No peripheral edema.  GASTROINTESTINAL: Abdomen is soft, non-tender, not distended w/ normal bowel sounds. GENITOURINARY: Bladder non tender, not  distended  MUSCULOSKELETAL: No abnormal joints or musculature NEUROLOGIC: Oriented X3. Cranial nerves 2-12 grossly intact PSYCHIATRIC: Mood and affect appropriate to situation, no behavioral issues  Patient Active Problem List   Diagnosis Date Noted  . Acute encephalopathy 11/04/2013  . Noninfectious gastroenteritis and colitis 11/04/2013  . Protein-calorie malnutrition, severe 11/02/2013  . Delirium 11/01/2013  . Altered mental state 11/01/2013  . COPD (chronic obstructive pulmonary disease) 11/01/2013  . Chronic hypoxemic respiratory failure 11/01/2013  . Toxic encephalopathy 10/03/2013  . UTI (urinary tract infection) 10/03/2013  . Acute-on-chronic  respiratory failure 10/03/2013  . Cerebral embolism with cerebral infarction 09/18/2013  . Accidental overdose 09/14/2013  . Hypothermia 09/14/2013  . Spinal stenosis of lumbar region at multiple levels 02/15/2011  . PULMONARY NODULE 02/22/2010  . Nonspecific (abnormal) findings on radiological and other examination of body structure 11/29/2008  . CT, CHEST, ABNORMAL 11/29/2008  . HYPOTHYROIDISM 06/25/2007  . HYPERCHOLESTEROLEMIA 06/25/2007  . ANEMIA 06/25/2007  . AMI 06/25/2007  . ANGINA, MILD 06/25/2007  . CORONARY ARTERY DISEASE 06/25/2007  . HEMORRHOIDS, INTERNAL 06/25/2007  . HEMORRHOIDS, EXTERNAL 06/25/2007  . HEMOCCULT POSITIVE STOOL 06/25/2007  . PYELONEPHRITIS, ACUTE 06/25/2007  . COLONIC POLYPS, HYPERPLASTIC 10/15/1997  . DIVERTICULOSIS, COLON 10/15/1997    CBC    Component Value Date/Time   WBC 7.0 11/02/2013 0430   WBC 10.6* 09/28/2009 1422   RBC 4.01 11/02/2013 0430   RBC 4.07 09/28/2009 1422   RBC 4.01 09/28/2009 1422   HGB 12.5 11/02/2013 0430   HGB 9.4* 09/28/2009 1422   HCT 37.5 11/02/2013 0430   HCT 29.1* 09/28/2009 1422   PLT 269 11/02/2013 0430   PLT 319 09/28/2009 1422   MCV 93.5 11/02/2013 0430   MCV 73* 09/28/2009 1422   LYMPHSABS 1.4 11/01/2013 1543   LYMPHSABS 2.0 09/28/2009 1422   MONOABS 0.4 11/01/2013  1543   EOSABS 0.0 11/01/2013 1543   EOSABS 0.7* 09/28/2009 1422   BASOSABS 0.0 11/01/2013 1543   BASOSABS 0.0 09/28/2009 1422    CMP     Component Value Date/Time   NA 144 11/03/2013 0506   K 4.3 11/03/2013 0506   CL 108 11/03/2013 0506   CO2 26 11/03/2013 0506   GLUCOSE 87 11/03/2013 0506   BUN 9 11/03/2013 0506   CREATININE 0.62 11/03/2013 0506   CALCIUM 8.7 11/03/2013 0506   PROT 6.1 11/02/2013 0430   ALBUMIN 3.4* 11/02/2013 0430   AST 14 11/02/2013 0430   ALT 7 11/02/2013 0430   ALKPHOS 54 11/02/2013 0430   BILITOT 0.6 11/02/2013 0430   GFRNONAA 82* 11/03/2013 0506   GFRAA >90 11/03/2013 0506    Assessment and Plan  Acute encephalopathy Patient had presented with confusion and generalized weakness and then had some episodes of nausea vomiting and diarrhea prior to admission. CT of the head which was done on admission was negative. Patient had no signs or symptoms of infection. Urinalysis which was done was negative. Chest x-ray which was done was also negative as well as acute abdominal series. Patient was hydrated with IV fluids and followed. Patient did not have any further diarrhea during patient improved clinically and was back to baseline by day of discharge.   Noninfectious gastroenteritis and colitis Patient presented with nausea diarrhea and episode of emesis prior to admission. It was felt this was likely secondary to a gastroenteritis. Abdominal x-rays which were done were unremarkable. A GI pathogen panel was done which was unremarkable. C. difficile was also negative. Patient was hydrated with IV fluids and maintained on supportive care. Patient did not have any further diarrhea nausea or vomiting during the hospitalization was back to baseline by day of discharge   HYPOTHYROIDISM TSH was checked which was within normal limits   Cerebral embolism with cerebral infarction Remained stable. Patient was maintained on Plavix for secondary stroke prevention   Protein-calorie  malnutrition, severe Encourage food and ensure supplement    Hennie Duos, MD

## 2013-11-06 NOTE — Assessment & Plan Note (Signed)
Patient presented with nausea diarrhea and episode of emesis prior to admission. It was felt this was likely secondary to a gastroenteritis. Abdominal x-rays which were done were unremarkable. A GI pathogen panel was done which was unremarkable. C. difficile was also negative. Patient was hydrated with IV fluids and maintained on supportive care. Patient did not have any further diarrhea nausea or vomiting during the hospitalization was back to baseline by day of discharge

## 2013-11-06 NOTE — Assessment & Plan Note (Signed)
Encourage food and ensure supplement

## 2013-11-06 NOTE — Assessment & Plan Note (Signed)
TSH was checked which was within normal limits

## 2013-11-06 NOTE — Assessment & Plan Note (Signed)
Patient had presented with confusion and generalized weakness and then had some episodes of nausea vomiting and diarrhea prior to admission. CT of the head which was done on admission was negative. Patient had no signs or symptoms of infection. Urinalysis which was done was negative. Chest x-ray which was done was also negative as well as acute abdominal series. Patient was hydrated with IV fluids and followed. Patient did not have any further diarrhea during patient improved clinically and was back to baseline by day of discharge.

## 2013-11-06 NOTE — Assessment & Plan Note (Signed)
Remained stable. Patient was maintained on Plavix for secondary stroke prevention

## 2013-11-07 NOTE — ED Provider Notes (Signed)
Medical screening examination/treatment/procedure(s) were conducted as a shared visit with non-physician practitioner(s) and myself.  I personally evaluated the patient during the encounter.   EKG Interpretation   Date/Time:  Sunday November 01 2013 15:54:37 EDT Ventricular Rate:  93 PR Interval:  160 QRS Duration: 88 QT Interval:  424 QTC Calculation: 527 R Axis:   25 Text Interpretation:  Sinus rhythm Probable left atrial enlargement  Borderline T abnormalities, inferior leads Prolonged QT interval Confirmed  by Jeneen Rinks  MD, [place] ([id number]) on 11/01/2013 3:57:10 PM      Pt seen and examined.  Presents with acute change in mental status today.  No meds to explain episode. Recent similar episode thought to be 2/2 narcotics.  Agree with admit for further eval. Tele. ?EEG.  Pt awake.  No concern for level of content. However, confused.  Tanna Furry, MD 11/07/13 1946

## 2013-11-23 ENCOUNTER — Non-Acute Institutional Stay (SKILLED_NURSING_FACILITY): Payer: Medicare Other | Admitting: Internal Medicine

## 2013-11-23 DIAGNOSIS — J449 Chronic obstructive pulmonary disease, unspecified: Secondary | ICD-10-CM

## 2013-11-23 DIAGNOSIS — K529 Noninfective gastroenteritis and colitis, unspecified: Secondary | ICD-10-CM

## 2013-11-23 DIAGNOSIS — G9340 Encephalopathy, unspecified: Secondary | ICD-10-CM

## 2013-11-23 DIAGNOSIS — K5289 Other specified noninfective gastroenteritis and colitis: Secondary | ICD-10-CM

## 2013-11-23 DIAGNOSIS — E039 Hypothyroidism, unspecified: Secondary | ICD-10-CM

## 2013-11-23 NOTE — Progress Notes (Signed)
Patient ID: [name], female   DOB: 1930-11-06, 80 y.o.   MRN: [id number]   Chief Complaint   Patient presents with   .   this is a discharge note   HPI: Patient is 80 y.o. female who was admitted to skilled nursing for short-term rehabilitation secondary to confusion that was thought to 2 gastroenteritis-this resolved fairly unremarkably-she has done well with physical therapy -- is walking well with a walker-and has gained strength there is been no increased confusion to my knowledge during her stay here Past Medical History   Diagnosis  Date   .  COPD (chronic obstructive pulmonary disease)    .  Carotid artery occlusion    .  Arthritis    .  Anxiety      Afraid , living alone   .  Myocardial infarction  1987   .  Anemia    .  Bilateral leg pain      for years   .  Femoral bruit    .  Bronchitis, chronic    .  Fall  April 2014     Pt. has fallen twife in the last 2 months.   .  Personal history of other diseases of digestive system      Upper GI bleed   .  Generalized anxiety disorder    .  Nonspecific abnormal electrocardiogram (ECG) (EKG)      Abnormal Results EKG   .  Memory loss      Worsening   .  Intestinal infection due to Clostridium difficile    .  Colitis due to Clostridium difficile    .  Hypertension    .  CAD (coronary artery disease)    .  Thyroid disease      Hypo-Thyroidism    Past Surgical History   Procedure  Laterality  Date   .  Carotid endarterectomy   12/30/2008      Medication List         t.            AMITIZA 24 MCG capsule    Generic drug: lubiprostone    Take 24 mcg by mouth daily as needed for constipation.    budesonide-formoterol 160-4.5 MCG/ACT inhaler    Commonly known as: SYMBICORT    Inhale 2 puffs into the lungs 2 (two) times daily as needed (for shortness of breath).    busPIRone 7.5 MG tablet    Commonly known as: BUSPAR    Take 7.5 mg by mouth 2 (two) times daily.    citalopram 20 MG tablet    Commonly known as:  CELEXA    Take 20 mg by mouth daily.    clopidogrel 75 MG tablet    Commonly known as: PLAVIX    Take 1 tablet (75 mg total) by mouth daily with breakfast.    donepezil 10 MG tablet    Commonly known as: ARICEPT    Take 10 mg by mouth at bedtime.    feeding supplement (ENSURE COMPLETE) Liqd    Take 237 mLs by mouth 3 (three) times daily between meals.    felodipine 5 MG 24 hr tablet    Commonly known as: PLENDIL    Take 5 mg by mouth daily.    gabapentin 300 MG capsule    Commonly known as: NEURONTIN    Take 300 mg by mouth at bedtime.    hydrOXYzine 25 MG tablet    Commonly known as: ATARAX/VISTARIL  Take 25 mg by mouth at bedtime as needed for anxiety.    levothyroxine 100 MCG tablet    Commonly known as: SYNTHROID, LEVOTHROID    Take 100 mcg by mouth daily before breakfast.    nitroGLYCERIN 0.4 MG SL tablet    Commonly known as: NITROSTAT    Place 0.4 mg under the tongue every 5 (five) minutes as needed for chest pain.    oxyCODONE-acetaminophen 5-325 MG per tablet    Commonly known as: PERCOCET/ROXICET    Take 1 tablet by mouth every 6 (six) hours as needed. For pain    potassium chloride 10 MEQ CR capsule    Commonly known as: MICRO-K    Take 10 mEq by mouth daily.    promethazine 12.5 MG tablet    Commonly known as: PHENERGAN    Take 6.25 mg by mouth every 6 (six) hours as needed for nausea or vomiting.              senna-docusate 8.6-50 MG per tablet    Commonly known as: Senokot-S    Take 1 tablet by mouth 2 (two) times daily.    tiotropium 18 MCG inhalation capsule    Commonly known as: SPIRIVA    Place 1 capsule (18 mcg total) into inhaler and inhale daily.     No orders of the defined types were placed in this encounter.  Immunization History   Administered  Date(s) Administered   .  Influenza Whole  01/07/2010    History   Substance Use Topics   .  Smoking status:  Former Smoker -- 15 years     Types:  Cigarettes     Quit date:  04/09/1984   .   Smokeless tobacco:  Never Used   .  Alcohol Use:  No   Family history is noncontributory  Review of Systems  DATA OBTAINED: from patient; no c/o  GENERAL: Feels well no fevers, fatigue, appetite changes  SKIN: No itching, rash or wounds  EYES: No eye pain, redness, discharge  EARS: No earache, tinnitus, change in hearing  NOSE: No congestion, drainage or bleeding  MOUTH/THROAT: No mouth or tooth pain, No sore throat  RESPIRATORY: No cough, wheezing, SOB  CARDIAC: No chest pain, palpitations, lower extremity edema  GI: No abdominal pain, No N/V/D or constipation, No heartburn or reflux  GU: No dysuria, frequency or urgency, or incontinence  MUSCULOSKELETAL: No unrelieved bone/joint pain--doing well with her walker  NEUROLOGIC: No headache, dizziness or focal weakness  PSYCHIATRIC: No overt anxiety or sadness. Sleeps well. No behavior issue.                    Physical Exam Temperature 97.6 pulse 80 respirations 18 blood pressure 111/58  GENERAL APPEARANCE: Alert, conversant. Appropriately groomed. No acute distress.  SKIN: No diaphoresis rash, or wounds  HEAD: Normocephalic, atraumatic  EYES: Conjunctiva/lids clear. Pupils round, reactive. EOMs intact.  EARS: External exam WNL, canals clear. Hearing grossly normal.  NOSE: No deformity or discharge.  MOUTH/THROAT: Lips w/o lesions.  RESPIRATORY: Breathing is even, unlabored. Lung sounds are clear  CARDIOVASCULAR: Heart RRR no murmurs, rubs or gallops. No peripheral edema.  GASTROINTESTINAL: Abdomen is soft, non-tender, not distended w/ normal bowel sounds.  MUSCULOSKELETAL: No abnormal joints or musculature--she is ambulating very well with her walker  NEUROLOGIC: Oriented X3. Cranial nerves 2-12 grossly intact  PSYCHIATRIC: Mood and affect appropriate to situation, no behavioral issues  Patient Active Problem List    Diagnosis  Date Noted   .  Acute encephalopathy  11/04/2013   .  Noninfectious gastroenteritis and colitis   11/04/2013   .  Protein-calorie malnutrition, severe  11/02/2013   .  Delirium  11/01/2013   .  Altered mental state  11/01/2013   .  COPD (chronic obstructive pulmonary disease)  11/01/2013   .  Chronic hypoxemic respiratory failure  11/01/2013   .  Toxic encephalopathy  10/03/2013   .  UTI (urinary tract infection)  10/03/2013   .  Acute-on-chronic respiratory failure  10/03/2013   .  Cerebral embolism with cerebral infarction  09/18/2013   .  Accidental overdose  09/14/2013   .  Hypothermia  09/14/2013   .  Spinal stenosis of lumbar region at multiple levels  02/15/2011   .  PULMONARY NODULE  02/22/2010   .  Nonspecific (abnormal) findings on radiological and other examination of body structure  11/29/2008   .  CT, CHEST, ABNORMAL  11/29/2008   .  HYPOTHYROIDISM  06/25/2007   .  HYPERCHOLESTEROLEMIA  06/25/2007   .  ANEMIA  06/25/2007   .  AMI  06/25/2007   .  ANGINA, MILD  06/25/2007   .  CORONARY ARTERY DISEASE  06/25/2007   .  HEMORRHOIDS, INTERNAL  06/25/2007   .  HEMORRHOIDS, EXTERNAL  06/25/2007   .  HEMOCCULT POSITIVE STOOL  06/25/2007   .  PYELONEPHRITIS, ACUTE  06/25/2007   .  COLONIC POLYPS, HYPERPLASTIC  10/15/1997   .  DIVERTICULOSIS, COLON  10/15/1997    CBC   Component  Value  Date/Time    WBC  7.0  11/02/2013 0430    WBC  10.6*  09/28/2009 1422    RBC  4.01  11/02/2013 0430    RBC  4.07  09/28/2009 1422    RBC  4.01  09/28/2009 1422    HGB  12.5  11/02/2013 0430    HGB  9.4*  09/28/2009 1422    HCT  37.5  11/02/2013 0430    HCT  29.1*  09/28/2009 1422    PLT  269  11/02/2013 0430    PLT  319  09/28/2009 1422    MCV  93.5  11/02/2013 0430    MCV  73*  09/28/2009 1422    LYMPHSABS  1.4  11/01/2013 1543    LYMPHSABS  2.0  09/28/2009 1422    MONOABS  0.4  11/01/2013 1543    EOSABS  0.0  11/01/2013 1543    EOSABS  0.7*  09/28/2009 1422    BASOSABS  0.0  11/01/2013 1543    BASOSABS  0.0  09/28/2009 1422   CMP    Component  Value  Date/Time    NA  144  11/03/2013 0506     K  4.3  11/03/2013 0506    CL  108  11/03/2013 0506    CO2  26  11/03/2013 0506    GLUCOSE  87  11/03/2013 0506    BUN  9  11/03/2013 0506    CREATININE  0.62  11/03/2013 0506    CALCIUM  8.7  11/03/2013 0506    PROT  6.1  11/02/2013 0430    ALBUMIN  3.4*  11/02/2013 0430    AST  14  11/02/2013 0430    ALT  7  11/02/2013 0430    ALKPHOS  54  11/02/2013 0430    BILITOT  0.6  11/02/2013 0430    GFRNONAA  82*  11/03/2013  0506    GFRAA  >90  11/03/2013 0506   Assessment and Plan  Acute encephalopathy  Patient had presented with confusion and generalized weakness and then had some episodes of nausea vomiting and diarrhea prior to admission. CT of the head which was done on admission was negative. Patient had no signs or symptoms of infection. Urinalysis which was done was negative. Chest x-ray which was done was also negative as well as acute abdominal series. Patient was hydrated with IV fluids and followed. Patient did not have any further diarrhea during patient improved clinically and was back at baseline apparently before hospital discharge she continues to be stable in this regards during her stay here has gained strength no episodes of confusion of my knowledge.  It appears she was admitted on Seroquel but this has been discontinued apparently by psychiatric services Noninfectious gastroenteritis and colitis  Patient presented with nausea diarrhea and episode of emesis prior to admission. It was felt this was likely secondary to a gastroenteritis. Abdominal x-rays which were done were unremarkable. A GI pathogen panel was done which was unremarkable. C. difficile was also negative. Patient was hydrated with IV fluids and maintained on supportive care. -- would like update labs however including a metabolic panel liver function tests and CBC to ensure stability before discharge-notify primary care provider of her results  HYPOTHYROIDISM  TSH was checked which was within normal limits  Cerebral embolism with  cerebral infarction  Remained stable. Patient was maintained on Plavix for secondary stroke prevention  Protein-calorie malnutrition, severe  Encourage food and ensure supplement --again she appears to have gained strength  COPD-this appears to have been a relatively stable with you during her stay here continue current medications.including Spiriva/Symbicort  Of note would like update again a CBC metabolic panel before discharge-notify primary care provider of results-also will be discharged home with home health  nursing support to followup her medical issues as well as PT and OT for further strengthening  [id number]-of note more than 30 minutes spent preparing this discharge summary-scripts have been written

## 2013-11-28 ENCOUNTER — Encounter: Payer: Self-pay | Admitting: Internal Medicine

## 2013-11-28 ENCOUNTER — Non-Acute Institutional Stay (SKILLED_NURSING_FACILITY): Payer: Medicare Other | Admitting: Internal Medicine

## 2013-11-28 DIAGNOSIS — J9611 Chronic respiratory failure with hypoxia: Secondary | ICD-10-CM

## 2013-11-28 DIAGNOSIS — J9610 Chronic respiratory failure, unspecified whether with hypoxia or hypercapnia: Secondary | ICD-10-CM

## 2013-11-28 DIAGNOSIS — R609 Edema, unspecified: Secondary | ICD-10-CM

## 2013-11-28 DIAGNOSIS — I6340 Cerebral infarction due to embolism of unspecified cerebral artery: Secondary | ICD-10-CM

## 2013-11-28 DIAGNOSIS — R600 Localized edema: Secondary | ICD-10-CM

## 2013-11-28 DIAGNOSIS — R0902 Hypoxemia: Secondary | ICD-10-CM

## 2013-11-28 NOTE — Progress Notes (Addendum)
MRN: [id number] Name: [name]  Sex: female Age: 80 y.o. DOB: 11/06/1930  [place] #: [name] Facility/Room:110 Level Of Care: SNF Provider: Inocencio Homes D Emergency Contacts: Extended Emergency Contact Information Primary Emergency Contact: [name] [address] [address] [place]itter of [place] Phone: [phone] Work Phone: [phone] Mobile Phone: [phone] Relation: Daughter Secondary Emergency Contact: [name] [place] of [place] Phone: [phone] Relation: Neighbor  Code Status: DNR  Allergies: Aspirin; Penicillins; Biaxin; Sulfonamide derivatives; Codeine; Hydrocodone; Sulfa antibiotics; and Xanax  Chief Complaint  Patient presents with  . Medical Management of Chronic Issues    HPI: Patient is 80 y.o. female who is being  seen for routine issues.  Past Medical History  Diagnosis Date  . COPD (chronic obstructive pulmonary disease)   . Carotid artery occlusion   . Arthritis   . Anxiety     Afraid , living alone  . Myocardial infarction 1987  . Anemia   . Bilateral leg pain     for years  . Femoral bruit   . Bronchitis, chronic   . Fall April 2014    Pt. has fallen twife in the last 2 months.   . Personal history of other diseases of digestive system     Upper GI bleed  . Generalized anxiety disorder   . Nonspecific abnormal electrocardiogram (ECG) (EKG)     Abnormal Results EKG  . Memory loss     Worsening  . Intestinal infection due to Clostridium difficile   . Colitis due to Clostridium difficile   . Hypertension   . CAD (coronary artery disease)   . Thyroid disease     Hypo-Thyroidism    Past Surgical History  Procedure Laterality Date  . Carotid endarterectomy  12/30/2008      Medication List       This list is accurate as of: 11/28/13  8:50 PM.  Always use your most recent med list.               AMITIZA 24 MCG capsule  Generic drug:  lubiprostone  Take 24 mcg by mouth daily as  needed for constipation.     budesonide-formoterol 160-4.5 MCG/ACT inhaler  Commonly known as:  SYMBICORT  Inhale 2 puffs into the lungs 2 (two) times daily as needed (for shortness of breath).     busPIRone 7.5 MG tablet  Commonly known as:  BUSPAR  Take 7.5 mg by mouth 2 (two) times daily.     citalopram 20 MG tablet  Commonly known as:  CELEXA  Take 20 mg by mouth daily.     clopidogrel 75 MG tablet  Commonly known as:  PLAVIX  Take 1 tablet (75 mg total) by mouth daily with breakfast.     donepezil 10 MG tablet  Commonly known as:  ARICEPT  Take 10 mg by mouth at bedtime.     feeding supplement (ENSURE COMPLETE) Liqd  Take 237 mLs by mouth 3 (three) times daily between meals.     felodipine 5 MG 24 hr tablet  Commonly known as:  PLENDIL  Take 5 mg by mouth daily.     gabapentin 300 MG capsule  Commonly known as:  NEURONTIN  Take 300 mg by mouth at bedtime.     hydrOXYzine 25 MG tablet  Commonly known as:  ATARAX/VISTARIL  Take 25 mg by mouth at bedtime as needed for anxiety.     levothyroxine 100 MCG tablet  Commonly known as:  SYNTHROID, LEVOTHROID  Take 100 mcg by mouth daily before breakfast.     nitroGLYCERIN 0.4 MG SL tablet  Commonly known as:  NITROSTAT  Place 0.4 mg under the tongue every 5 (five) minutes as needed for chest pain.     oxyCODONE-acetaminophen 5-325 MG per tablet  Commonly known as:  PERCOCET/ROXICET  Take 1 tablet by mouth every 6 (six) hours as needed. For pain     potassium chloride 10 MEQ CR capsule  Commonly known as:  MICRO-K  Take 10 mEq by mouth daily.     promethazine 12.5 MG tablet  Commonly known as:  PHENERGAN  Take 6.25 mg by mouth every 6 (six) hours as needed for nausea or vomiting.     QUEtiapine 25 MG tablet  Commonly known as:  SEROQUEL  Take 25 mg by mouth at bedtime.     senna-docusate 8.6-50 MG per tablet  Commonly known as:  Senokot-S  Take 1 tablet by mouth 2 (two) times daily.     tiotropium 18 MCG  inhalation capsule  Commonly known as:  SPIRIVA  Place 1 capsule (18 mcg total) into inhaler and inhale daily.        No orders of the defined types were placed in this encounter.    Immunization History  Administered Date(s) Administered  . Influenza Whole 01/07/2010    History  Substance Use Topics  . Smoking status: Former Smoker -- 15 years    Types: Cigarettes    Quit date: 04/09/1984  . Smokeless tobacco: Never Used  . Alcohol Use: No    Review of Systems  DATA OBTAINED: from patient, nurse GENERAL: Feels well no fevers, fatigue, appetite changes SKIN: No itching, rash HEENT: No complaint RESPIRATORY: No cough, wheezing, SOB CARDIAC: No chest pain, palpitations,pt says her feet have been swelling  GI: No abdominal pain, No N/V/D or constipation, No heartburn or reflux  GU: No dysuria, frequency or urgency, or incontinence  MUSCULOSKELETAL: No unrelieved bone/joint pain NEUROLOGIC: No headache, dizziness or focal weakness PSYCHIATRIC: No overt anxiety or sadness. Sleeps well.   Filed Vitals:   11/18/13 2042  BP: 150/70  Pulse: 70  Temp: 96.8 F (36 C)  Resp: 16    Physical Exam  GENERAL APPEARANCE: Alert, conversant. Appropriately groomed. No acute distress  SKIN: No diaphoresis rash HEENT: Unremarkable RESPIRATORY: Breathing is even, unlabored. Lung sounds are clear   CARDIOVASCULAR: Heart RRR no murmurs, rubs or gallops. No peripheral edema  GASTROINTESTINAL: Abdomen is soft, non-tender, not distended w/ normal bowel sounds.  GENITOURINARY: Bladder non tender, not distended  MUSCULOSKELETAL: No abnormal joints or musculature NEUROLOGIC: Cranial nerves 2-12 grossly intact. Moves all extremities no tremor. PSYCHIATRIC: Mild anxiety, no behavioral issues  Patient Active Problem List   Diagnosis Date Noted  . Acute encephalopathy 11/04/2013  . Noninfectious gastroenteritis and colitis 11/04/2013  . Protein-calorie malnutrition, severe 11/02/2013  .  Delirium 11/01/2013  . Altered mental state 11/01/2013  . COPD (chronic obstructive pulmonary disease) 11/01/2013  . Chronic hypoxemic respiratory failure 11/01/2013  . Toxic encephalopathy 10/03/2013  . UTI (urinary tract infection) 10/03/2013  . Acute-on-chronic respiratory failure 10/03/2013  . Cerebral embolism with cerebral infarction 09/18/2013  . Accidental overdose 09/14/2013  . Hypothermia 09/14/2013  . Spinal stenosis of lumbar region at multiple levels 02/15/2011  . PULMONARY NODULE 02/22/2010  . Nonspecific (abnormal) findings on radiological and other examination of body structure 11/29/2008  . CT, CHEST, ABNORMAL 11/29/2008  . HYPOTHYROIDISM 06/25/2007  .  HYPERCHOLESTEROLEMIA 06/25/2007  . ANEMIA 06/25/2007  . AMI 06/25/2007  . ANGINA, MILD 06/25/2007  . CORONARY ARTERY DISEASE 06/25/2007  . HEMORRHOIDS, INTERNAL 06/25/2007  . HEMORRHOIDS, EXTERNAL 06/25/2007  . HEMOCCULT POSITIVE STOOL 06/25/2007  . PYELONEPHRITIS, ACUTE 06/25/2007  . COLONIC POLYPS, HYPERPLASTIC 10/15/1997  . DIVERTICULOSIS, COLON 10/15/1997    CBC    Component Value Date/Time   WBC 7.0 11/02/2013 0430   WBC 10.6* 09/28/2009 1422   RBC 4.01 11/02/2013 0430   RBC 4.07 09/28/2009 1422   RBC 4.01 09/28/2009 1422   HGB 12.5 11/02/2013 0430   HGB 9.4* 09/28/2009 1422   HCT 37.5 11/02/2013 0430   HCT 29.1* 09/28/2009 1422   PLT 269 11/02/2013 0430   PLT 319 09/28/2009 1422   MCV 93.5 11/02/2013 0430   MCV 73* 09/28/2009 1422   LYMPHSABS 1.4 11/01/2013 1543   LYMPHSABS 2.0 09/28/2009 1422   MONOABS 0.4 11/01/2013 1543   EOSABS 0.0 11/01/2013 1543   EOSABS 0.7* 09/28/2009 1422   BASOSABS 0.0 11/01/2013 1543   BASOSABS 0.0 09/28/2009 1422    CMP     Component Value Date/Time   NA 144 11/03/2013 0506   K 4.3 11/03/2013 0506   CL 108 11/03/2013 0506   CO2 26 11/03/2013 0506   GLUCOSE 87 11/03/2013 0506   BUN 9 11/03/2013 0506   CREATININE 0.62 11/03/2013 0506   CALCIUM 8.7 11/03/2013 0506   PROT 6.1 11/02/2013  0430   ALBUMIN 3.4* 11/02/2013 0430   AST 14 11/02/2013 0430   ALT 7 11/02/2013 0430   ALKPHOS 54 11/02/2013 0430   BILITOT 0.6 11/02/2013 0430   GFRNONAA 82* 11/03/2013 0506   GFRAA >90 11/03/2013 0506    Assessment and Plan  Cerebral embolism with cerebral infarction No new issues  Chronic hypoxemic respiratory failure Pt has been breathing fine;stable  PEDAL EDEMA - pt seems very concerned about it; there is no swelling today and pt admits it is "much better" today and never any swelling in the am; pt may have some undx dementia  Hennie Duos, MD  Pt visit occurred 11/18/2013

## 2013-11-28 NOTE — Assessment & Plan Note (Signed)
No new issues 

## 2013-11-28 NOTE — Assessment & Plan Note (Signed)
Pt has been breathing fine;stable

## 2013-12-08 ENCOUNTER — Other Ambulatory Visit: Payer: Self-pay | Admitting: Internal Medicine

## 2013-12-14 ENCOUNTER — Other Ambulatory Visit: Payer: Self-pay | Admitting: Internal Medicine

## 2014-01-28 ENCOUNTER — Other Ambulatory Visit: Payer: Self-pay | Admitting: Internal Medicine

## 2014-02-19 ENCOUNTER — Other Ambulatory Visit: Payer: Self-pay | Admitting: Internal Medicine

## 2014-03-18 ENCOUNTER — Encounter (HOSPITAL_COMMUNITY): Payer: Self-pay | Admitting: Vascular Surgery

## 2014-03-24 ENCOUNTER — Other Ambulatory Visit: Payer: Self-pay | Admitting: Internal Medicine

## 2014-03-31 ENCOUNTER — Other Ambulatory Visit: Payer: Self-pay | Admitting: Internal Medicine

## 2014-04-13 ENCOUNTER — Other Ambulatory Visit: Payer: Self-pay | Admitting: Internal Medicine

## 2014-05-06 ENCOUNTER — Other Ambulatory Visit: Payer: Self-pay | Admitting: Internal Medicine

## 2014-05-10 ENCOUNTER — Other Ambulatory Visit: Payer: Self-pay | Admitting: Internal Medicine

## 2014-08-03 ENCOUNTER — Other Ambulatory Visit: Payer: Self-pay | Admitting: Internal Medicine

## 2014-08-16 ENCOUNTER — Other Ambulatory Visit: Payer: Self-pay | Admitting: Internal Medicine

## 2014-12-04 ENCOUNTER — Other Ambulatory Visit: Payer: Self-pay | Admitting: Internal Medicine

## 2015-03-15 ENCOUNTER — Emergency Department (HOSPITAL_COMMUNITY): Payer: Medicare Other

## 2015-03-15 ENCOUNTER — Emergency Department (HOSPITAL_COMMUNITY)
Admission: EM | Admit: 2015-03-15 | Discharge: 2015-03-15 | Disposition: A | Discharge status: Home / Self Care | Payer: Medicare Other | Attending: Emergency Medicine | Admitting: Emergency Medicine

## 2015-03-15 ENCOUNTER — Encounter (HOSPITAL_COMMUNITY): Payer: Self-pay

## 2015-03-15 DIAGNOSIS — Z79899 Other long term (current) drug therapy: Secondary | ICD-10-CM | POA: Insufficient documentation

## 2015-03-15 DIAGNOSIS — Z862 Personal history of diseases of the blood and blood-forming organs and certain disorders involving the immune mechanism: Secondary | ICD-10-CM | POA: Diagnosis not present

## 2015-03-15 DIAGNOSIS — J441 Chronic obstructive pulmonary disease with (acute) exacerbation: Secondary | ICD-10-CM | POA: Insufficient documentation

## 2015-03-15 DIAGNOSIS — I252 Old myocardial infarction: Secondary | ICD-10-CM | POA: Insufficient documentation

## 2015-03-15 DIAGNOSIS — R0602 Shortness of breath: Secondary | ICD-10-CM

## 2015-03-15 DIAGNOSIS — E079 Disorder of thyroid, unspecified: Secondary | ICD-10-CM | POA: Insufficient documentation

## 2015-03-15 DIAGNOSIS — M1990 Unspecified osteoarthritis, unspecified site: Secondary | ICD-10-CM | POA: Insufficient documentation

## 2015-03-15 DIAGNOSIS — I2510 Atherosclerotic heart disease of native coronary artery without angina pectoris: Secondary | ICD-10-CM | POA: Insufficient documentation

## 2015-03-15 DIAGNOSIS — T148XXA Other injury of unspecified body region, initial encounter: Secondary | ICD-10-CM

## 2015-03-15 DIAGNOSIS — I10 Essential (primary) hypertension: Secondary | ICD-10-CM | POA: Diagnosis not present

## 2015-03-15 DIAGNOSIS — Z87891 Personal history of nicotine dependence: Secondary | ICD-10-CM | POA: Insufficient documentation

## 2015-03-15 DIAGNOSIS — Z880 Allergy status to penicillin: Secondary | ICD-10-CM | POA: Diagnosis not present

## 2015-03-15 LAB — BASIC METABOLIC PANEL
Anion gap: 14 (ref 5–15)
BUN: 8 mg/dL (ref 6–20)
CHLORIDE: 106 mmol/L (ref 101–111)
CO2: 22 mmol/L (ref 22–32)
CREATININE: 0.68 mg/dL (ref 0.44–1.00)
Calcium: 9.5 mg/dL (ref 8.9–10.3)
GFR calc Af Amer: >60 mL/min (ref >60)
GFR calc non Af Amer: >60 mL/min (ref >60)
GLUCOSE: 141 mg/dL — AB (ref 65–99)
Potassium: 3 mmol/L — ABNORMAL LOW (ref 3.5–5.1)
SODIUM: 142 mmol/L (ref 135–145)

## 2015-03-15 LAB — CBC
HCT: 37.3 % (ref 36.0–46.0)
Hemoglobin: 12 g/dL (ref 12.0–15.0)
MCH: 28.5 pg (ref 26.0–34.0)
MCHC: 32.2 g/dL (ref 30.0–36.0)
MCV: 88.6 fL (ref 78.0–100.0)
PLATELETS: 323 10*3/uL (ref 150–400)
RBC: 4.21 MIL/uL (ref 3.87–5.11)
RDW: 12.9 % (ref 11.5–15.5)
WBC: 9 10*3/uL (ref 4.0–10.5)

## 2015-03-15 LAB — I-STAT TROPONIN, ED: TROPONIN I, POC: 0 ng/mL (ref 0.00–0.08)

## 2015-03-15 MED ORDER — ALBUTEROL SULFATE (2.5 MG/3ML) 0.083% IN NEBU
5.0000 mg | INHALATION_SOLUTION | Freq: Once | RESPIRATORY_TRACT | Status: AC
  Administered 2015-03-15: 5 mg via RESPIRATORY_TRACT
  Filled 2015-03-15: qty 6

## 2015-03-15 MED ORDER — PREDNISONE 50 MG PO TABS: 50.0000 mg | ORAL_TABLET | Freq: Every day | ORAL | Status: AC

## 2015-03-15 MED ORDER — ALBUTEROL SULFATE HFA 108 (90 BASE) MCG/ACT IN AERS
2.0000 | INHALATION_SPRAY | Freq: Once | RESPIRATORY_TRACT | Status: AC
  Administered 2015-03-15: 2 via RESPIRATORY_TRACT
  Filled 2015-03-15: qty 6.7

## 2015-03-15 MED ORDER — AZITHROMYCIN 250 MG PO TABS: ORAL_TABLET | ORAL | Status: AC

## 2015-03-15 NOTE — Discharge Instructions (Signed)
Use your albuterol inhaler every 4 hours as needed for shortness of breath. Use your home oxygen as needed. Return to the emergency department for any worsening shortness of breath. Follow-up with your primary care physician in the next 2 days for reevaluation.

## 2015-03-15 NOTE — ED Notes (Signed)
[facility]- pt coming from PCP office with c/o shortness of breath. Pt has hx of COPD, uses home O2 as needed. Pt has had shortness of breath X1 week, worsened today. Pt had total of 7.'5mg'$   albuterol, 0.'5mg'$  of atrovent and '125mg'$  of solumedrol. Pt using accessory muscles and has labored breathing on arrival.

## 2015-03-15 NOTE — ED Notes (Signed)
Pt does not have oxygen to transport home with, pt became very SOB, without O2, tried to convince pt that she needed to stay, pt unable to speak in full sentences, states she does not have inhalers or treatments at home. MD notified, pt states she still wants to go home, pt knows risks of going home and she could end up back here on a breathing machine or could die at home, pt still refuses to stay. Daughter instructed if pt status gets worse to call 911

## 2015-03-15 NOTE — ED Provider Notes (Signed)
CSN: [id number]     Arrival date & time 03/15/15  1454 History   First MD Initiated Contact with Patient 03/15/15 1510     Chief Complaint  Patient presents with  . Shortness of Breath     (Consider location/radiation/quality/duration/timing/severity/associated sxs/prior Treatment) HPI  Patient is an 81 year old female with past medical history significant for COPD (intermittent home O2), CAD, anxiety, anemia, who presents to the emergency department with shortness of breath. Patient was sent from her PCP where she was seen as an outpatient earlier today. 1 week history of progressively worsening shortness of breath associated with cough, change in sputum production. Shortness of breath is worse with activity. States that her shortness of breath improved over the past couple of days. Upon arrival to the PCP she was found to have an O2 of 85-90% on 1 L O2, given albuterol, Atrovent, 125 mg IV Solu-Medrol prior to transport port via EMS to the emergency department. Denies fevers, chills, cough, congestion, chest pain, nausea, vomiting, diaphoresis, numbness or weakness of extremities.  No history of DVT/PE. No recent surgery or prolonged immobilization.  Past Medical History  Diagnosis Date  . COPD (chronic obstructive pulmonary disease) ([place])   . Carotid artery occlusion   . Arthritis   . Anxiety     Afraid , living alone  . Myocardial infarction ([place]) 1987  . Anemia   . Bilateral leg pain     for years  . Femoral bruit   . Bronchitis, chronic ([place])   . Fall April 2014    Pt. has fallen twife in the last 2 months.   . Personal history of other diseases of digestive system     Upper GI bleed  . Generalized anxiety disorder   . Nonspecific abnormal electrocardiogram (ECG) (EKG)     Abnormal Results EKG  . Memory loss     Worsening  . Intestinal infection due to Clostridium difficile   . Colitis due to Clostridium difficile   . Hypertension   . CAD (coronary artery disease)   .  Thyroid disease     Hypo-Thyroidism   Past Surgical History  Procedure Laterality Date  . Carotid endarterectomy  12/30/2008  . Abdominal aortagram N/A 09/19/2012    Procedure: ABDOMINAL Maxcine Ham;  Surgeon: Elam Dutch, MD;  Location: [facility] CATH LAB;  Service: Cardiovascular;  Laterality: N/A;  . Lower extremity angiogram Bilateral 09/19/2012    Procedure: LOWER EXTREMITY ANGIOGRAM;  Surgeon: Elam Dutch, MD;  Location: [facility] CATH LAB;  Service: Cardiovascular;  Laterality: Bilateral;   Family History  Problem Relation Age of Onset  . Cancer Mother   . Deep vein thrombosis Mother   . Cancer Father   . Kidney disease Father   . Heart disease Father    Social History  Substance Use Topics  . Smoking status: Former Smoker -- 15 years    Types: Cigarettes    Quit date: 04/09/1984  . Smokeless tobacco: Never Used  . Alcohol Use: No   OB History    No data available     Review of Systems  Constitutional: Negative for fever, chills, appetite change and fatigue.  HENT: Negative for congestion.   Eyes: Negative for visual disturbance.  Respiratory: Positive for cough, shortness of breath and wheezing. Negative for chest tightness.   Cardiovascular: Negative for chest pain, palpitations and leg swelling.  Gastrointestinal: Negative for vomiting, abdominal pain, diarrhea and blood in stool.  Genitourinary: Negative for dysuria, hematuria and flank pain.  Musculoskeletal:  Negative for back pain.  Skin: Negative for rash.  Neurological: Negative for dizziness, seizures, weakness and light-headedness.  Psychiatric/Behavioral: Negative for behavioral problems.  All other systems reviewed and are negative.     Allergies  Aspirin; Penicillins; Biaxin; Sulfonamide derivatives; Codeine; Hydrocodone; Sulfa antibiotics; and Xanax  Home Medications   Prior to Admission medications   Medication Sig Start Date End Date Taking? Authorizing Provider  budesonide-formoterol (SYMBICORT)  160-4.5 MCG/ACT inhaler Inhale 2 puffs into the lungs 2 (two) times daily as needed (for shortness of breath).    Yes Historical Provider, MD  citalopram (CELEXA) 20 MG tablet Take 20 mg by mouth daily.  01/27/11  Yes Historical Provider, MD  clopidogrel (PLAVIX) 75 MG tablet Take 1 tablet (75 mg total) by mouth daily with breakfast. 09/21/13  Yes Kinnie Feil, MD  feeding supplement, ENSURE COMPLETE, (ENSURE COMPLETE) LIQD Take 237 mLs by mouth 3 (three) times daily between meals. Patient taking differently: Take 237 mLs by mouth daily.  11/04/13  Yes Eugenie Filler, MD  felodipine (PLENDIL) 5 MG 24 hr tablet Take 10 mg by mouth daily.    Yes Historical Provider, MD  gabapentin (NEURONTIN) 300 MG capsule Take 300 mg by mouth at bedtime.   Yes Historical Provider, MD  ipratropium-albuterol (DUONEB) 0.5-2.5 (3) MG/3ML SOLN Take 3 mLs by nebulization every 6 (six) hours as needed. 03/10/15  Yes Historical Provider, MD  levothyroxine (SYNTHROID, LEVOTHROID) 100 MCG tablet TAKE 1 TABLET BY MOUTH EVERY MORNING BEFORE BREAKFAST 03/31/14  Yes Estill Dooms, MD  nitroGLYCERIN (NITROSTAT) 0.4 MG SL tablet Place 0.4 mg under the tongue every 5 (five) minutes as needed for chest pain.   Yes Historical Provider, MD  oxyCODONE-acetaminophen (PERCOCET/ROXICET) 5-325 MG per tablet Take 1 tablet by mouth every 6 (six) hours as needed. For pain Patient taking differently: Take 1 tablet by mouth every 6 (six) hours as needed for moderate pain or severe pain. For pain 11/04/13  Yes Eugenie Filler, MD  potassium chloride (MICRO-K) 10 MEQ CR capsule Take 10 mEq by mouth daily.   Yes Historical Provider, MD  senna-docusate (SENOKOT-S) 8.6-50 MG per tablet Take 1 tablet by mouth 2 (two) times daily. 09/21/13  Yes Kinnie Feil, MD  tiotropium (SPIRIVA) 18 MCG inhalation capsule Place 1 capsule (18 mcg total) into inhaler and inhale daily. 11/04/13  Yes Eugenie Filler, MD  azithromycin (ZITHROMAX Z-PAK) 250 MG  tablet Take 2 tablets (500 mg) on day 1. Day 2-5 take 1 tablet (250 mg) 03/15/15 03/21/15  Nathaniel Man, MD  predniSONE (DELTASONE) 50 MG tablet Take 1 tablet (50 mg total) by mouth daily with breakfast. 03/15/15 03/19/15  Nathaniel Man, MD   BP 138/69 mmHg  Pulse 110  Temp(Src) 98 F (36.7 C) (Oral)  Resp 24  Ht '5\' 1"'$  (1.549 m)  Wt 51.71 kg  BMI 21.55 kg/m2  SpO2 94% Physical Exam  Constitutional: She is oriented to person, place, and time. She appears well-developed and well-nourished. No distress.  HENT:  Head: Normocephalic and atraumatic.  Mouth/Throat: Oropharynx is clear and moist.  Eyes: Conjunctivae and EOM are normal. Pupils are equal, round, and reactive to light.  Neck: Normal range of motion. No JVD present. No tracheal deviation present.  Cardiovascular: Normal rate, regular rhythm, normal heart sounds and intact distal pulses.   No murmur heard. Pulmonary/Chest: No stridor. She is in respiratory distress. She has wheezes (diffuse expiratory). She exhibits no tenderness.  Speaking in 2-3 word sentences.  Abdominal: She exhibits no distension. There is no tenderness. There is no rebound and no guarding.  Musculoskeletal: Normal range of motion. She exhibits no edema.  Neurological: She is alert and oriented to person, place, and time.  Skin: Skin is warm. No pallor.  Psychiatric: She has a normal mood and affect.    ED Course  Procedures (including critical care time) Labs Review Labs Reviewed  BASIC METABOLIC PANEL - Abnormal; Notable for the following:    Potassium 3.0 (*)    Glucose, Bld 141 (*)    All other components within normal limits  CBC  I-STAT TROPOININ, ED    Imaging Review Dg Chest Port 1 View  03/15/2015  CLINICAL DATA:  Shortness of breath for 1 day.  COPD. EXAM: PORTABLE CHEST 1 VIEW COMPARISON:  11/01/2013 FINDINGS: Tapering of the peripheral pulmonary vasculature favors emphysema. Atherosclerotic calcification of the aortic arch. Heart size  within normal limits. Minimal scarring along the left hemidiaphragm. Thoracic spondylosis. IMPRESSION: 1. Emphysema. 2. Atherosclerotic aortic arch. Electronically Signed   By: Van Clines M.D.   On: 03/15/2015 15:46   I have personally reviewed and evaluated these images and lab results as part of my medical decision-making.   EKG Interpretation   Date/Time:  Tuesday March 15 2015 15:03:22 EST Ventricular Rate:  115 PR Interval:  161 QRS Duration: 87 QT Interval:  363 QTC Calculation: 502 R Axis:   74 Text Interpretation:  Sinus tachycardia Right atrial enlargement Prolonged  QT interval no significant change since July 2015 Confirmed by Regenia Skeeter   MD, SCOTT ([id number]) on 03/15/2015 3:13:25 PM      MDM   Final diagnoses:  COPD exacerbation [facility])   Patient is an 81 year old female with past medical history significant for COPD (intermittent home O2), CAD, anemia, presents to the emergency department with a one-week history of shortness of breath. SpO2 85-90% on 1L at PCP. On arrival patient appears in mild respiratory distress with tachypnea and increased accessory muscle use. Afebrile, hemodynamically stable, 100% on 4L [place].   Differential diagnosis includes pneumonia, COPD exacerbation, pulmonary embolism, pneumothorax. Lab work unremarkable. Hypokalemia 3.0 which is normal for the patient. No leukocytosis. Troponin 0.00. Chest x-ray showed no acute findings. EKG showed sinus tachycardia, rate 115, right atrial enlargement, normal intervals, no signs of ischemia, no change from prior EKGs. Patient most likely with a COPD exacerbation.  Patient received IV Solu-Medrol 125 mg, Duonebs prior to arrival. Patient given another treatment with albuterol. Low risk well's criteria, doubt pulmonary embolism.  On reevaluation, patient continues to have diffuse expiratory wheezing with increased work of breathing. Patient insistent that she be discharged home and that she does not want to be  admitted for COPD exacerbation. O2 decreased to 1L [place]. Patient monitored on 1L [place] for 45 minutes, maintained SpO2 91%.  Discussed at length with patient that I recommended admission to the hospital for COPD exacerbation. Discussed the risk of discharge home including worsening respiratory distress, increased O2 requirement, need for intubation, death. Patient refuses to be admitted to the hospital. States that her daughter is staying with her tonight and that she will call 911 if she has any worsening shortness of breath. Patient has home O2. Patient given albuterol inhaler, prednisone, azithromycin prescriptions upon discharge. Instructed to follow up with PCP in the next 1-2 days for reevaluation. Patient discharged home. No questions or concerns at time of discharge, patient and her daughter expressed understanding for return to the emergency department.    Larene [place]  Noya Santarelli, MD 03/16/15 [place], MD 03/19/15 [phone]

## 2015-05-20 ENCOUNTER — Institutional Professional Consult (permissible substitution): Payer: Self-pay | Admitting: Internal Medicine

## 2015-05-29 ENCOUNTER — Encounter (HOSPITAL_COMMUNITY): Payer: Self-pay | Admitting: Emergency Medicine

## 2015-05-29 ENCOUNTER — Inpatient Hospital Stay (HOSPITAL_COMMUNITY)
Admission: EM | Admit: 2015-05-29 | Discharge: 2015-05-31 | DRG: 378 | Disposition: A | Discharge status: Home / Self Care | Payer: Medicare Other | Attending: Internal Medicine | Admitting: Internal Medicine

## 2015-05-29 DIAGNOSIS — K254 Chronic or unspecified gastric ulcer with hemorrhage: Principal | ICD-10-CM | POA: Diagnosis present

## 2015-05-29 DIAGNOSIS — Z8719 Personal history of other diseases of the digestive system: Secondary | ICD-10-CM | POA: Diagnosis present

## 2015-05-29 DIAGNOSIS — Z8249 Family history of ischemic heart disease and other diseases of the circulatory system: Secondary | ICD-10-CM

## 2015-05-29 DIAGNOSIS — F411 Generalized anxiety disorder: Secondary | ICD-10-CM | POA: Diagnosis present

## 2015-05-29 DIAGNOSIS — G8929 Other chronic pain: Secondary | ICD-10-CM | POA: Diagnosis present

## 2015-05-29 DIAGNOSIS — Z8711 Personal history of peptic ulcer disease: Secondary | ICD-10-CM

## 2015-05-29 DIAGNOSIS — E039 Hypothyroidism, unspecified: Secondary | ICD-10-CM | POA: Diagnosis present

## 2015-05-29 DIAGNOSIS — Z7951 Long term (current) use of inhaled steroids: Secondary | ICD-10-CM

## 2015-05-29 DIAGNOSIS — M1990 Unspecified osteoarthritis, unspecified site: Secondary | ICD-10-CM | POA: Diagnosis present

## 2015-05-29 DIAGNOSIS — Z841 Family history of disorders of kidney and ureter: Secondary | ICD-10-CM | POA: Diagnosis not present

## 2015-05-29 DIAGNOSIS — R413 Other amnesia: Secondary | ICD-10-CM | POA: Diagnosis present

## 2015-05-29 DIAGNOSIS — I5032 Chronic diastolic (congestive) heart failure: Secondary | ICD-10-CM | POA: Diagnosis present

## 2015-05-29 DIAGNOSIS — Z66 Do not resuscitate: Secondary | ICD-10-CM | POA: Diagnosis present

## 2015-05-29 DIAGNOSIS — I2510 Atherosclerotic heart disease of native coronary artery without angina pectoris: Secondary | ICD-10-CM | POA: Diagnosis present

## 2015-05-29 DIAGNOSIS — K922 Gastrointestinal hemorrhage, unspecified: Secondary | ICD-10-CM | POA: Diagnosis not present

## 2015-05-29 DIAGNOSIS — T39395A Adverse effect of other nonsteroidal anti-inflammatory drugs [NSAID], initial encounter: Secondary | ICD-10-CM | POA: Diagnosis present

## 2015-05-29 DIAGNOSIS — Z79899 Other long term (current) drug therapy: Secondary | ICD-10-CM | POA: Diagnosis not present

## 2015-05-29 DIAGNOSIS — R531 Weakness: Secondary | ICD-10-CM | POA: Diagnosis present

## 2015-05-29 DIAGNOSIS — J449 Chronic obstructive pulmonary disease, unspecified: Secondary | ICD-10-CM | POA: Diagnosis present

## 2015-05-29 DIAGNOSIS — F419 Anxiety disorder, unspecified: Secondary | ICD-10-CM | POA: Diagnosis present

## 2015-05-29 DIAGNOSIS — I10 Essential (primary) hypertension: Secondary | ICD-10-CM | POA: Diagnosis present

## 2015-05-29 DIAGNOSIS — Z809 Family history of malignant neoplasm, unspecified: Secondary | ICD-10-CM | POA: Diagnosis not present

## 2015-05-29 DIAGNOSIS — D649 Anemia, unspecified: Secondary | ICD-10-CM | POA: Diagnosis not present

## 2015-05-29 DIAGNOSIS — Z87891 Personal history of nicotine dependence: Secondary | ICD-10-CM | POA: Diagnosis not present

## 2015-05-29 DIAGNOSIS — D62 Acute posthemorrhagic anemia: Secondary | ICD-10-CM | POA: Diagnosis present

## 2015-05-29 DIAGNOSIS — I252 Old myocardial infarction: Secondary | ICD-10-CM | POA: Diagnosis not present

## 2015-05-29 DIAGNOSIS — J438 Other emphysema: Secondary | ICD-10-CM | POA: Diagnosis not present

## 2015-05-29 DIAGNOSIS — D72829 Elevated white blood cell count, unspecified: Secondary | ICD-10-CM | POA: Diagnosis present

## 2015-05-29 DIAGNOSIS — Z7902 Long term (current) use of antithrombotics/antiplatelets: Secondary | ICD-10-CM | POA: Diagnosis not present

## 2015-05-29 DIAGNOSIS — E038 Other specified hypothyroidism: Secondary | ICD-10-CM | POA: Diagnosis not present

## 2015-05-29 LAB — URINALYSIS, ROUTINE W REFLEX MICROSCOPIC
BILIRUBIN URINE: NEGATIVE
GLUCOSE, UA: NEGATIVE mg/dL
KETONES UR: NEGATIVE mg/dL
Nitrite: NEGATIVE
PROTEIN: NEGATIVE mg/dL
Specific Gravity, Urine: 1.018 (ref 1.005–1.030)
pH: 6 (ref 5.0–8.0)

## 2015-05-29 LAB — CBC
HCT: 26.3 % — ABNORMAL LOW (ref 36.0–46.0)
HEMOGLOBIN: 9 g/dL — AB (ref 12.0–15.0)
MCH: 29.2 pg (ref 26.0–34.0)
MCHC: 34.2 g/dL (ref 30.0–36.0)
MCV: 85.4 fL (ref 78.0–100.0)
Platelets: 259 10*3/uL (ref 150–400)
RBC: 3.08 MIL/uL — AB (ref 3.87–5.11)
RDW: 15.6 % — ABNORMAL HIGH (ref 11.5–15.5)
WBC: 11.2 10*3/uL — ABNORMAL HIGH (ref 4.0–10.5)

## 2015-05-29 LAB — COMPREHENSIVE METABOLIC PANEL
ALT: 13 U/L — ABNORMAL LOW (ref 14–54)
ANION GAP: 10 (ref 5–15)
AST: 21 U/L (ref 15–41)
Albumin: 4 g/dL (ref 3.5–5.0)
Alkaline Phosphatase: 59 U/L (ref 38–126)
BUN: 42 mg/dL — ABNORMAL HIGH (ref 6–20)
CHLORIDE: 108 mmol/L (ref 101–111)
CO2: 23 mmol/L (ref 22–32)
Calcium: 9.6 mg/dL (ref 8.9–10.3)
Creatinine, Ser: 0.82 mg/dL (ref 0.44–1.00)
GFR calc non Af Amer: >60 mL/min (ref >60)
Glucose, Bld: 146 mg/dL — ABNORMAL HIGH (ref 65–99)
POTASSIUM: 3.5 mmol/L (ref 3.5–5.1)
SODIUM: 141 mmol/L (ref 135–145)
Total Bilirubin: 0.4 mg/dL (ref 0.3–1.2)
Total Protein: 6.3 g/dL — ABNORMAL LOW (ref 6.5–8.1)

## 2015-05-29 LAB — DIFFERENTIAL
Basophils Absolute: 0 10*3/uL (ref 0.0–0.1)
Basophils Relative: 0 %
EOS ABS: 0.1 10*3/uL (ref 0.0–0.7)
EOS PCT: 1 %
LYMPHS ABS: 2.2 10*3/uL (ref 0.7–4.0)
LYMPHS PCT: 20 %
MONOS PCT: 4 %
Monocytes Absolute: 0.5 10*3/uL (ref 0.1–1.0)
NEUTROS PCT: 75 %
Neutro Abs: 8.3 10*3/uL — ABNORMAL HIGH (ref 1.7–7.7)

## 2015-05-29 LAB — PREPARE RBC (CROSSMATCH)

## 2015-05-29 LAB — CBG MONITORING, ED: Glucose-Capillary: 123 mg/dL — ABNORMAL HIGH (ref 65–99)

## 2015-05-29 LAB — URINE MICROSCOPIC-ADD ON

## 2015-05-29 LAB — POC OCCULT BLOOD, ED: FECAL OCCULT BLD: POSITIVE — AB

## 2015-05-29 LAB — HEMATOCRIT: HCT: 21.7 % — ABNORMAL LOW (ref 36.0–46.0)

## 2015-05-29 LAB — HEMOGLOBIN: Hemoglobin: 7.2 g/dL — ABNORMAL LOW (ref 12.0–15.0)

## 2015-05-29 MED ORDER — ONDANSETRON HCL 4 MG/2ML IJ SOLN
4.0000 mg | Freq: Four times a day (QID) | INTRAMUSCULAR | Status: DC | PRN
  Administered 2015-05-30: 4 mg via INTRAVENOUS
  Filled 2015-05-29: qty 2

## 2015-05-29 MED ORDER — IPRATROPIUM-ALBUTEROL 0.5-2.5 (3) MG/3ML IN SOLN: 3.0000 mL | Freq: Four times a day (QID) | RESPIRATORY_TRACT | Status: DC | PRN

## 2015-05-29 MED ORDER — ONDANSETRON HCL 4 MG PO TABS: 4.0000 mg | ORAL_TABLET | Freq: Four times a day (QID) | ORAL | Status: DC | PRN

## 2015-05-29 MED ORDER — ENSURE ENLIVE PO LIQD
237.0000 mL | Freq: Three times a day (TID) | ORAL | Status: DC
  Administered 2015-05-30: 237 mL via ORAL
  Filled 2015-05-29 (×2): qty 237

## 2015-05-29 MED ORDER — ACETAMINOPHEN 650 MG RE SUPP: 650.0000 mg | Freq: Four times a day (QID) | RECTAL | Status: DC | PRN

## 2015-05-29 MED ORDER — MORPHINE SULFATE (PF) 2 MG/ML IV SOLN
2.0000 mg | INTRAVENOUS | Status: DC | PRN
  Administered 2015-05-30: 2 mg via INTRAVENOUS
  Filled 2015-05-29: qty 1

## 2015-05-29 MED ORDER — SODIUM CHLORIDE 0.9 % IV SOLN
1000.0000 mL | INTRAVENOUS | Status: DC
  Administered 2015-05-29: 1000 mL via INTRAVENOUS

## 2015-05-29 MED ORDER — LEVOTHYROXINE SODIUM 100 MCG PO TABS
100.0000 ug | ORAL_TABLET | Freq: Every day | ORAL | Status: DC
  Administered 2015-05-30 – 2015-05-31 (×2): 100 ug via ORAL
  Filled 2015-05-29 (×3): qty 1

## 2015-05-29 MED ORDER — SODIUM CHLORIDE 0.9 % IV SOLN
8.0000 mg/h | INTRAVENOUS | Status: DC
  Administered 2015-05-29 – 2015-05-30 (×2): 8 mg/h via INTRAVENOUS
  Filled 2015-05-29 (×4): qty 80

## 2015-05-29 MED ORDER — SODIUM CHLORIDE 0.9 % IV SOLN: Freq: Once | INTRAVENOUS | Status: DC

## 2015-05-29 MED ORDER — BUDESONIDE-FORMOTEROL FUMARATE 160-4.5 MCG/ACT IN AERO
2.0000 | INHALATION_SPRAY | Freq: Two times a day (BID) | RESPIRATORY_TRACT | Status: DC | PRN
  Filled 2015-05-29: qty 6

## 2015-05-29 MED ORDER — HYDROXYZINE HCL 25 MG PO TABS: 25.0000 mg | ORAL_TABLET | Freq: Every day | ORAL | Status: DC | PRN

## 2015-05-29 MED ORDER — FELODIPINE ER 10 MG PO TB24
10.0000 mg | ORAL_TABLET | Freq: Every day | ORAL | Status: DC
Start: 2015-05-30 — End: 2015-05-31
  Administered 2015-05-30 – 2015-05-31 (×2): 10 mg via ORAL
  Filled 2015-05-29 (×2): qty 1

## 2015-05-29 MED ORDER — SODIUM CHLORIDE 0.9 % IV SOLN
1000.0000 mL | Freq: Once | INTRAVENOUS | Status: AC
  Administered 2015-05-29: 1000 mL via INTRAVENOUS

## 2015-05-29 MED ORDER — TIOTROPIUM BROMIDE MONOHYDRATE 18 MCG IN CAPS
18.0000 ug | ORAL_CAPSULE | Freq: Every day | RESPIRATORY_TRACT | Status: DC
  Administered 2015-05-31: 18 ug via RESPIRATORY_TRACT
  Filled 2015-05-29 (×2): qty 5

## 2015-05-29 MED ORDER — MORPHINE SULFATE (PF) 4 MG/ML IV SOLN
4.0000 mg | Freq: Once | INTRAVENOUS | Status: AC
  Administered 2015-05-29: 4 mg via INTRAVENOUS
  Filled 2015-05-29: qty 1

## 2015-05-29 MED ORDER — ACETAMINOPHEN 325 MG PO TABS
650.0000 mg | ORAL_TABLET | Freq: Four times a day (QID) | ORAL | Status: DC | PRN
  Administered 2015-05-31: 650 mg via ORAL
  Filled 2015-05-29: qty 2

## 2015-05-29 MED ORDER — ONDANSETRON HCL 4 MG/2ML IJ SOLN
4.0000 mg | Freq: Once | INTRAMUSCULAR | Status: AC
  Administered 2015-05-29: 4 mg via INTRAVENOUS
  Filled 2015-05-29: qty 2

## 2015-05-29 MED ORDER — NITROGLYCERIN 0.4 MG SL SUBL: 0.4000 mg | SUBLINGUAL_TABLET | SUBLINGUAL | Status: DC | PRN

## 2015-05-29 MED ORDER — POTASSIUM CHLORIDE IN NACL 20-0.9 MEQ/L-% IV SOLN
INTRAVENOUS | Status: AC
  Administered 2015-05-30: 05:00:00 via INTRAVENOUS
  Filled 2015-05-29 (×2): qty 1000

## 2015-05-29 MED ORDER — PANTOPRAZOLE SODIUM 40 MG IV SOLR: 40.0000 mg | Freq: Two times a day (BID) | INTRAVENOUS | Status: DC

## 2015-05-29 MED ORDER — CITALOPRAM HYDROBROMIDE 20 MG PO TABS
20.0000 mg | ORAL_TABLET | Freq: Every day | ORAL | Status: DC
Start: 2015-05-30 — End: 2015-05-31
  Administered 2015-05-30 – 2015-05-31 (×2): 20 mg via ORAL
  Filled 2015-05-29 (×2): qty 1

## 2015-05-29 MED ORDER — SODIUM CHLORIDE 0.9 % IV SOLN
80.0000 mg | Freq: Once | INTRAVENOUS | Status: AC
  Administered 2015-05-29: 80 mg via INTRAVENOUS
  Filled 2015-05-29: qty 80

## 2015-05-29 MED ORDER — SODIUM CHLORIDE 0.9% FLUSH
3.0000 mL | Freq: Two times a day (BID) | INTRAVENOUS | Status: DC
  Administered 2015-05-30 – 2015-05-31 (×2): 3 mL via INTRAVENOUS

## 2015-05-29 NOTE — ED Provider Notes (Addendum)
CSN: [id number]     Arrival date & time 05/29/15  1704 History   First MD Initiated Contact with Patient 05/29/15 1727     Chief Complaint  Patient presents with  . Rectal Bleeding  . Abdominal Pain  . Dizziness     (Consider location/radiation/quality/duration/timing/severity/associated sxs/prior Treatment) Patient is a 82 y.o. female presenting with hematochezia, abdominal pain, and dizziness. The history is provided by the patient.  Rectal Bleeding Associated symptoms: abdominal pain and dizziness   Abdominal Pain Associated symptoms: hematochezia   Dizziness She has had malaise for the last 2 days with anorexia and nausea and some loose bowel movements. Last night, she started having melena but denies visual blood. She is having some mild abdominal cramping and is feeling dizzy and weak. She denies history of GI bleeding and denies use of NSAIDs. She is on clopidogrel, but no anticoagulants  Past Medical History  Diagnosis Date  . COPD (chronic obstructive pulmonary disease) ([place])   . Carotid artery occlusion   . Arthritis   . Anxiety     Afraid , living alone  . Myocardial infarction ([place]) 1987  . Anemia   . Bilateral leg pain     for years  . Femoral bruit   . Bronchitis, chronic ([place])   . Fall April 2014    Pt. has fallen twife in the last 2 months.   . Personal history of other diseases of digestive system     Upper GI bleed  . Generalized anxiety disorder   . Nonspecific abnormal electrocardiogram (ECG) (EKG)     Abnormal Results EKG  . Memory loss     Worsening  . Intestinal infection due to Clostridium difficile   . Colitis due to Clostridium difficile   . Hypertension   . CAD (coronary artery disease)   . Thyroid disease     Hypo-Thyroidism   Past Surgical History  Procedure Laterality Date  . Carotid endarterectomy  12/30/2008  . Abdominal aortagram N/A 09/19/2012    Procedure: ABDOMINAL Maxcine Ham;  Surgeon: Elam Dutch, MD;  Location: [facility] LAB;   Service: Cardiovascular;  Laterality: N/A;  . Lower extremity angiogram Bilateral 09/19/2012    Procedure: LOWER EXTREMITY ANGIOGRAM;  Surgeon: Elam Dutch, MD;  Location: [facility] CATH LAB;  Service: Cardiovascular;  Laterality: Bilateral;   Family History  Problem Relation Age of Onset  . Cancer Mother   . Deep vein thrombosis Mother   . Cancer Father   . Kidney disease Father   . Heart disease Father    Social History  Substance Use Topics  . Smoking status: Former Smoker -- 15 years    Types: Cigarettes    Quit date: 04/09/1984  . Smokeless tobacco: Never Used  . Alcohol Use: No   OB History    No data available     Review of Systems  Gastrointestinal: Positive for abdominal pain and hematochezia.  Neurological: Positive for dizziness.  All other systems reviewed and are negative.     Allergies  Aspirin; Penicillins; Biaxin; Sulfonamide derivatives; Codeine; Hydrocodone; Sulfa antibiotics; and Xanax  Home Medications   Prior to Admission medications   Medication Sig Start Date End Date Taking? Authorizing Provider  budesonide-formoterol (SYMBICORT) 160-4.5 MCG/ACT inhaler Inhale 2 puffs into the lungs 2 (two) times daily as needed (for shortness of breath).    Yes Historical Provider, MD  clopidogrel (PLAVIX) 75 MG tablet Take 1 tablet (75 mg total) by mouth daily with breakfast. 09/21/13  Yes Alicia Amel  Wynonia Lawman, MD  diphenhydramine-acetaminophen (TYLENOL PM) 25-500 MG TABS tablet Take 2 tablets by mouth at bedtime as needed (sleep).   Yes Historical Provider, MD  feeding supplement, ENSURE COMPLETE, (ENSURE COMPLETE) LIQD Take 237 mLs by mouth 3 (three) times daily between meals. Patient taking differently: Take 237 mLs by mouth daily.  11/04/13  Yes Eugenie Filler, MD  hydrOXYzine (ATARAX/VISTARIL) 25 MG tablet Take 25-50 mg by mouth daily as needed (sleep).  05/28/15  Yes Historical Provider, MD  ipratropium-albuterol (DUONEB) 0.5-2.5 (3) MG/3ML SOLN Take 3 mLs by  nebulization every 6 (six) hours as needed (shortness of breath).  03/10/15  Yes Historical Provider, MD  levothyroxine (SYNTHROID, LEVOTHROID) 100 MCG tablet TAKE 1 TABLET BY MOUTH EVERY MORNING BEFORE BREAKFAST 03/31/14  Yes Estill Dooms, MD  nitroGLYCERIN (NITROSTAT) 0.4 MG SL tablet Place 0.4 mg under the tongue every 5 (five) minutes as needed for chest pain.   Yes Historical Provider, MD  oxyCODONE-acetaminophen (PERCOCET/ROXICET) 5-325 MG per tablet Take 1 tablet by mouth every 6 (six) hours as needed. For pain Patient taking differently: Take 1 tablet by mouth every 6 (six) hours as needed for moderate pain or severe pain. For pain 11/04/13  Yes Eugenie Filler, MD  tiotropium (SPIRIVA) 18 MCG inhalation capsule Place 1 capsule (18 mcg total) into inhaler and inhale daily. 11/04/13  Yes Eugenie Filler, MD  citalopram (CELEXA) 20 MG tablet Take 20 mg by mouth daily.  01/27/11   Historical Provider, MD  felodipine (PLENDIL) 5 MG 24 hr tablet Take 10 mg by mouth daily.     Historical Provider, MD  gabapentin (NEURONTIN) 300 MG capsule Take 300 mg by mouth at bedtime.    Historical Provider, MD  potassium chloride (MICRO-K) 10 MEQ CR capsule Take 10 mEq by mouth daily.    Historical Provider, MD   BP 134/80 mmHg  Pulse 101  Temp(Src) 98.9 F (37.2 C) (Oral)  Resp 18  SpO2 94% Physical Exam  Nursing note and vitals reviewed.  82 year old female, resting comfortably and in no acute distress. Vital signs are significant for borderline tachycardia. Oxygen saturation is 94%, which is normal. Head is normocephalic and atraumatic. PERRLA, EOMI. Oropharynx is clear. Conjunctivae are pale. Neck is nontender and supple without adenopathy or JVD. Back is nontender and there is no CVA tenderness. Lungs are clear without rales, wheezes, or rhonchi. Chest is nontender. Heart has regular rate and rhythm without murmur. Abdomen is soft, flat, with mild epigastric tenderness and mild tenderness  across the suprapubic area. There are no masses or hepatosplenomegaly and peristalsis is hypoactive. Rectal: Normal sphincter tone. No masses. Small amount of black stool which is sent for Hemoccult testing. Extremities have no cyanosis or edema, full range of motion is present. Skin is warm and dry without rash. Skin is noted to be very pale. Neurologic: Mental status is normal, cranial nerves are intact, there are no motor or sensory deficits.  ED Course  Procedures (including critical care time) Labs Review Results for orders placed or performed during the hospital encounter of 05/29/15  Comprehensive metabolic panel  Result Value Ref Range   Sodium 141 135 - 145 mmol/L   Potassium 3.5 3.5 - 5.1 mmol/L   Chloride 108 101 - 111 mmol/L   CO2 23 22 - 32 mmol/L   Glucose, Bld 146 (H) 65 - 99 mg/dL   BUN 42 (H) 6 - 20 mg/dL   Creatinine, Ser 0.82 0.44 - 1.00 mg/dL  Calcium 9.6 8.9 - 10.3 mg/dL   Total Protein 6.3 (L) 6.5 - 8.1 g/dL   Albumin 4.0 3.5 - 5.0 g/dL   AST 21 15 - 41 U/L   ALT 13 (L) 14 - 54 U/L   Alkaline Phosphatase 59 38 - 126 U/L   Total Bilirubin 0.4 0.3 - 1.2 mg/dL   GFR calc non Af Amer >60 >60 mL/min   GFR calc Af Amer >60 >60 mL/min   Anion gap 10 5 - 15  CBC  Result Value Ref Range   WBC 11.2 (H) 4.0 - 10.5 K/uL   RBC 3.08 (L) 3.87 - 5.11 MIL/uL   Hemoglobin 9.0 (L) 12.0 - 15.0 g/dL   HCT 26.3 (L) 36.0 - 46.0 %   MCV 85.4 78.0 - 100.0 fL   MCH 29.2 26.0 - 34.0 pg   MCHC 34.2 30.0 - 36.0 g/dL   RDW 15.6 (H) 11.5 - 15.5 %   Platelets 259 150 - 400 K/uL  Differential  Result Value Ref Range   Neutrophils Relative % 75 %   Neutro Abs 8.3 (H) 1.7 - 7.7 K/uL   Lymphocytes Relative 20 %   Lymphs Abs 2.2 0.7 - 4.0 K/uL   Monocytes Relative 4 %   Monocytes Absolute 0.5 0.1 - 1.0 K/uL   Eosinophils Relative 1 %   Eosinophils Absolute 0.1 0.0 - 0.7 K/uL   Basophils Relative 0 %   Basophils Absolute 0.0 0.0 - 0.1 K/uL  POC occult blood, ED  Result Value  Ref Range   Fecal Occult Bld POSITIVE (A) NEGATIVE  CBG monitoring, ED  Result Value Ref Range   Glucose-Capillary 123 (H) 65 - 99 mg/dL  Type and screen [place]  Result Value Ref Range   ABO/RH(D) O POS    Antibody Screen PENDING    Sample Expiration 06/01/2015    I have personally reviewed and evaluated these images and lab results as part of my medical decision-making.   EKG Interpretation   Date/Time:  Sunday May 29 2015 17:18:48 EST Ventricular Rate:  99 PR Interval:  157 QRS Duration: 87 QT Interval:  381 QTC Calculation: 489 R Axis:   69 Text Interpretation:  Sinus rhythm Low voltage, precordial leads  Borderline prolonged QT interval Baseline wander in lead(s) V6 When  compared with ECG of 03/15/2015, QT has shortened Confirmed by [facility]  MD,  Graesyn Schreifels ([id number]) on 05/29/2015 5:27:01 PM      MDM   Final diagnoses:  Upper gastrointestinal bleeding    Probable upper GI bleed. Hemoglobin is 9 compared with hemoglobin of 12 in December. Hemoccult testing is pending, but she will be started on pantoprazole. Old records are reviewed, and I see no visits for GI complaints.  Hemoccult has come back positive. BUN is significantly increased with normal creatinine consistent with upper GI bleeding. Case is discussed with Dr. Myna Hidalgo [facility] who agrees to admit the patient.  Delora Fuel, MD 25/95/63 [id number]  Delora Fuel, MD 43/32/95 [id number]

## 2015-05-29 NOTE — H&P (Signed)
[facility] History and Physical  [name] YJE:[id number] DOB: [birth year] DOA: 05/29/2015  Referring physician: ED physician PCP: Junie Panning, NP  Specialists: Dr. Oneida Alar (cardiology), Dr. Gwenette Greet (pulmonology), Dr. Marin Olp (oncology)  Chief Complaint:  Black stools, weakness    HPI: [name] is a 82 y.o. female with PMH of CAD on Plavix, COPD not on home O2, hypothyroidism, and chronic normocytic anemia who presents the emergency department with 1 day of generalized weakness, nausea, mild epigastric tenderness, and black stools. Patient reports being in her usual state of fairly good health up until this morning when she noted onset of nausea and very mild tenderness in the epigastrium. This was associated with weakness and when she had a urge to defecate, she noted stool to be black and tarry. She reports never experiencing anything similar previously. She went on to have additional black tarry stools and came into the ED for evaluation. She denies any recent fever, chills, or acute illness. She does not consume alcohol, though she does note taking approximately 3 tabs of Advil daily. She has no known liver disease. EGD was performed in 2011 notation was made of a bleeding duodenal ulcer which was cauterized. Gastric ulcers were also noted. Colonoscopy was performed in 1999 and was notable for left-sided diverticulosis, internal hemorrhoids, and external hemorrhoids. There was 1 polyp that was removed. There is been no frank blood per rectum and no hematemesis.  In ED, patient was found tobe afebrile, saturating well on room air, with heart rate 101 and blood pressure stable. DRE produced melanotic stool which was FOBT positive. EKG features a sinus rhythm with low. Touch QRS. CBC features a mild leukocytosis to 11,200 as well as a hemoglobin of 9.0, down from 12 two months prior.  Chem panel was notable for BUN elevation of 42 with a serum creatinine of 0.8. Type and cross  was performed, patient was bolused with 1 L normal saline, a 80 mg IV bolus of Protonix was administered and the patient was started on Protonix infusion. She remained hemodynamically stable in the emergency department with no bleeding episodes. She'll be admitted for ongoing evaluation and management of a suspected acute upper GI bleed with anemia.  Where does patient live?   At home     Can patient participate in ADLs?  Yes         Review of Systems:   General: no fevers, chills, sweats, weight change, or poor appetite. Fatigued HEENT: no blurry vision, hearing changes or sore throat Pulm: no dyspnea, cough, or wheeze CV: no chest pain or palpitations Abd: no vomiting, diarrhea, or constipation. Nausea, upper quadrants mildly tender GU: no dysuria, hematuria, increased urinary frequency, or urgency  Ext: no leg edema Neuro: no focal weakness, numbness, or tingling, no vision change or hearing loss Skin: no rash, no wounds MSK: No muscle spasm, no deformity, no red, hot, or swollen joint Heme: No easy bruising or bleeding Travel history: No recent long distant travel    Allergy:  Allergies  Allergen Reactions  . Aspirin     GI  Bleed  . Penicillins     Numbness around mouth  Has patient had a PCN reaction causing immediate rash, facial/tongue/throat swelling, SOB or lightheadedness with hypotension: Yes Has patient had a PCN reaction causing severe rash involving mucus membranes or skin necrosis: No Has patient had a PCN reaction that required hospitalization No Has patient had a PCN reaction occurring within the last 10 years: No If  all of the above answers are "NO", then may proceed with Cephalosporin use.    . Biaxin [Clarithromycin] Nausea Only  . Sulfonamide Derivatives Nausea And Vomiting  . Codeine Nausea And Vomiting  . Hydrocodone Nausea And Vomiting  . Sulfa Antibiotics Nausea And Vomiting  . Xanax [Alprazolam]     unknown    Past Medical History  Diagnosis  Date  . COPD (chronic obstructive pulmonary disease) ([place])   . Carotid artery occlusion   . Arthritis   . Anxiety     Afraid , living alone  . Myocardial infarction ([place]) 1987  . Anemia   . Bilateral leg pain     for years  . Femoral bruit   . Bronchitis, chronic ([place])   . Fall April 2014    Pt. has fallen twife in the last 2 months.   . Personal history of other diseases of digestive system     Upper GI bleed  . Generalized anxiety disorder   . Nonspecific abnormal electrocardiogram (ECG) (EKG)     Abnormal Results EKG  . Memory loss     Worsening  . Intestinal infection due to Clostridium difficile   . Colitis due to Clostridium difficile   . Hypertension   . CAD (coronary artery disease)   . Thyroid disease     Hypo-Thyroidism    Past Surgical History  Procedure Laterality Date  . Carotid endarterectomy  12/30/2008  . Abdominal aortagram N/A 09/19/2012    Procedure: ABDOMINAL Maxcine Ham;  Surgeon: Elam Dutch, MD;  Location: [facility] CATH LAB;  Service: Cardiovascular;  Laterality: N/A;  . Lower extremity angiogram Bilateral 09/19/2012    Procedure: LOWER EXTREMITY ANGIOGRAM;  Surgeon: Elam Dutch, MD;  Location: [facility] C[facility] LAB;  Service: Cardiovascular;  Laterality: Bilateral;    Social History:  reports that she quit smoking about 31 years ago. Her smoking use included Cigarettes. She quit after 15 years of use. She has never used smokeless tobacco. She reports that she does not drink alcohol or use illicit drugs.  Family History:  Family History  Problem Relation Age of Onset  . Cancer Mother   . Deep vein thrombosis Mother   . Cancer Father   . Kidney disease Father   . Heart disease Father      Prior to Admission medications   Medication Sig Start Date End Date Taking? Authorizing Provider  budesonide-formoterol (SYMBICORT) 160-4.5 MCG/ACT inhaler Inhale 2 puffs into the lungs 2 (two) times daily as needed (for shortness of breath).    Yes Historical Provider,  MD  clopidogrel (PLAVIX) 75 MG tablet Take 1 tablet (75 mg total) by mouth daily with breakfast. 09/21/13  Yes Kinnie Feil, MD  diphenhydramine-acetaminophen (TYLENOL PM) 25-500 MG TABS tablet Take 2 tablets by mouth at bedtime as needed (sleep).   Yes Historical Provider, MD  feeding supplement, ENSURE COMPLETE, (ENSURE COMPLETE) LIQD Take 237 mLs by mouth 3 (three) times daily between meals. Patient taking differently: Take 237 mLs by mouth daily.  11/04/13  Yes Eugenie Filler, MD  hydrOXYzine (ATARAX/VISTARIL) 25 MG tablet Take 25-50 mg by mouth daily as needed (sleep).  05/28/15  Yes Historical Provider, MD  ipratropium-albuterol (DUONEB) 0.5-2.5 (3) MG/3ML SOLN Take 3 mLs by nebulization every 6 (six) hours as needed (shortness of breath).  03/10/15  Yes Historical Provider, MD  levothyroxine (SYNTHROID, LEVOTHROID) 100 MCG tablet TAKE 1 TABLET BY MOUTH EVERY MORNING BEFORE BREAKFAST 03/31/14  Yes Estill Dooms, MD  nitroGLYCERIN (NITROSTAT)  0.4 MG SL tablet Place 0.4 mg under the tongue every 5 (five) minutes as needed for chest pain.   Yes Historical Provider, MD  oxyCODONE-acetaminophen (PERCOCET/ROXICET) 5-325 MG per tablet Take 1 tablet by mouth every 6 (six) hours as needed. For pain Patient taking differently: Take 1 tablet by mouth every 6 (six) hours as needed for moderate pain or severe pain. For pain 11/04/13  Yes Eugenie Filler, MD  tiotropium (SPIRIVA) 18 MCG inhalation capsule Place 1 capsule (18 mcg total) into inhaler and inhale daily. 11/04/13  Yes Eugenie Filler, MD  citalopram (CELEXA) 20 MG tablet Take 20 mg by mouth daily.    Historical Provider, MD  felodipine (PLENDIL) 5 MG 24 hr tablet Take 10 mg by mouth daily.     Historical Provider, MD  potassium chloride (MICRO-K) 10 MEQ CR capsule Take 10 mEq by mouth daily.    Historical Provider, MD    Physical Exam: Filed Vitals:   05/29/15 1710 05/29/15 1820 05/29/15 1855 05/29/15 1957  BP: 134/80 126/74 110/76  156/75  Pulse: 101 92 93 93  Temp: 98.9 F (37.2 C)     TempSrc: Oral     Resp: '18 20 26 18  '$ SpO2: 94% 100% 100% 99%   General: Not in acute distress HEENT:       Eyes: PERRL, EOMI, no scleral icterus. Pale conjunctivae       ENT: No discharge from the ears or nose, no pharyngeal ulcers.        Neck: No JVD, no bruit, no appreciable mass Heme: No cervical adenopathy, no pallor Cardiac: S1/S2, RRR, soft systolic murmur at LSB, No gallops or rubs. Pulm: Good air movement bilaterally. No rales, wheezing, rhonchi or rubs. Abd: Soft, nondistended, mild tenderness at upper quadrants, no rebound pain or gaurding, no mass or organomegaly, BS present. Ext: No LE edema bilaterally. 2+DP/PT pulse bilaterally. Musculoskeletal: No gross deformity, no red, hot, swollen joints   Skin: No rashes or wounds on exposed surfaces  Neuro: Alert, oriented X3, cranial nerves II-XII grossly intact. No focal findings Psych: Patient is not overtly psychotic, appropriate mood and affect.  Labs on Admission:  Basic Metabolic Panel:  Recent Labs Lab 05/29/15 1735  NA 141  K 3.5  CL 108  CO2 23  GLUCOSE 146*  BUN 42*  CREATININE 0.82  CALCIUM 9.6   Liver Function Tests:  Recent Labs Lab 05/29/15 1735  AST 21  ALT 13*  ALKPHOS 59  BILITOT 0.4  PROT 6.3*  ALBUMIN 4.0   No results for input(s): LIPASE, AMYLASE in the last 168 hours. No results for input(s): AMMONIA in the last 168 hours. CBC:  Recent Labs Lab 05/29/15 1735  WBC 11.2*  NEUTROABS 8.3*  HGB 9.0*  HCT 26.3*  MCV 85.4  PLT 259   Cardiac Enzymes: No results for input(s): CKTOTAL, CKMB, CKMBINDEX, TROPONINI in the last 168 hours.  BNP (last 3 results) No results for input(s): BNP in the last 8760 hours.  ProBNP (last 3 results) No results for input(s): PROBNP in the last 8760 hours.  CBG:  Recent Labs Lab 05/29/15 1815  GLUCAP 123*    Radiological Exams on Admission: No results found.  EKG: Independently  reviewed.  Abnormal findings:  Sinus rhythm, low-voltage QRS   Assessment/Plan  1. Acute upper GI bleed  - Black tarry stools, onset this morning  - Hgb 9.0 on arrival, down from 12 two mos prior  - FOBT positive  - Has h/o  bleeding peptic ulcer, cauterized in 2011  - Has been taking ~3 Advil 200s almost every day for chronic pain  - Advised to stop Advil, start PPI on discharge   - Will hold Plavix  - Protonix bolus given, infusion started  - IVF hydration, serial H/H overnight  - Type and screen completed, blood bank asked to prepare 2 units to hold  - Pt refusing any invasive investigations or treatment; states will not undergo endoscopy   2. Acute on chronic anemia  - Baseline Hgb ~12, now 9.0 on admission, likely secondary to acute GI losses  - Managing GI bleed as above  - 2 pRBC units on hold  - Holding Plavix, Advil  - PPI bolus and infusion    3. COPD  - Stable at this time  - Continue prn DuoNeb q6h, scheduled Spiriva  - Monitoring on continuous pulse oximetry  - Given acute anemia and h/o CAD, goal sat is close to 100% for now, titrate FiO2 to maintain this   4. CAD - No angina or anginal equivalent  - EKG with sinus rhythm, no acute ischemic findings  - Holding Plavix in light of acute GIB  - Monitoring on telemetry  - Keep O2 sats close to 100% while acutely anemic   5. Hypothyroidism  - Apparently stable  - Continue home-dose Synthroid    6. Leukocytosis - No suggestion of active infection  - Suspected secondary to stress demargination in setting of acute GIB  - Monitor for infectious s/s     DVT ppx:  SCDs  Code Status: Full code Family Communication: None at bed side.                Disposition Plan: Admit to inpatient   Date of Service 05/29/2015    Vianne Bulls, MD [facility] Pager [phone]  If 7PM-7AM, please contact night-coverage [email or web address] Password [facility] 05/29/2015, 9:25 PM

## 2015-05-29 NOTE — ED Notes (Signed)
Bed: FO27 Expected date:  Expected time:  Means of arrival:  Comments: Triage 2

## 2015-05-29 NOTE — ED Notes (Signed)
[name] (Daughter) [phone] (H) [phone] (M)

## 2015-05-29 NOTE — ED Notes (Addendum)
Very black stools beginning this morning, dizzy, weak, and having abdominal pain and nausea. Pt states she feels SOB, but uses O2 at home when she feels this way. Appears pale in triage. States she has bruises on her arms and knees, and that she doesn't know where they came from, denies being on any blood thinners. Mouth breathing in triage, placed on O2

## 2015-05-29 NOTE — ED Notes (Addendum)
Pt would not sand to dizzy.

## 2015-05-29 NOTE — ED Notes (Signed)
Pt cannot use restroom at this time, aware urine specimen is needed.  

## 2015-05-30 DIAGNOSIS — E038 Other specified hypothyroidism: Secondary | ICD-10-CM

## 2015-05-30 DIAGNOSIS — D62 Acute posthemorrhagic anemia: Secondary | ICD-10-CM

## 2015-05-30 DIAGNOSIS — K922 Gastrointestinal hemorrhage, unspecified: Secondary | ICD-10-CM

## 2015-05-30 DIAGNOSIS — D72829 Elevated white blood cell count, unspecified: Secondary | ICD-10-CM

## 2015-05-30 LAB — BASIC METABOLIC PANEL
ANION GAP: 5 (ref 5–15)
BUN: 23 mg/dL — ABNORMAL HIGH (ref 6–20)
CHLORIDE: 115 mmol/L — AB (ref 101–111)
CO2: 23 mmol/L (ref 22–32)
Calcium: 8.1 mg/dL — ABNORMAL LOW (ref 8.9–10.3)
Creatinine, Ser: 0.71 mg/dL (ref 0.44–1.00)
GFR calc non Af Amer: >60 mL/min (ref >60)
Glucose, Bld: 90 mg/dL (ref 65–99)
POTASSIUM: 3.6 mmol/L (ref 3.5–5.1)
SODIUM: 143 mmol/L (ref 135–145)

## 2015-05-30 LAB — HEMATOCRIT
HEMATOCRIT: 30.1 % — AB (ref 36.0–46.0)
HEMATOCRIT: 34.7 % — AB (ref 36.0–46.0)

## 2015-05-30 LAB — CBG MONITORING, ED: Glucose-Capillary: 73 mg/dL (ref 65–99)

## 2015-05-30 LAB — HEMOGLOBIN
HEMOGLOBIN: 10.1 g/dL — AB (ref 12.0–15.0)
HEMOGLOBIN: 11.3 g/dL — AB (ref 12.0–15.0)

## 2015-05-30 MED ORDER — ALBUTEROL SULFATE (2.5 MG/3ML) 0.083% IN NEBU: 2.5000 mg | INHALATION_SOLUTION | Freq: Four times a day (QID) | RESPIRATORY_TRACT | Status: DC | PRN

## 2015-05-30 MED ORDER — ZOLPIDEM TARTRATE 5 MG PO TABS
5.0000 mg | ORAL_TABLET | Freq: Every evening | ORAL | Status: DC | PRN
  Administered 2015-05-30: 5 mg via ORAL
  Filled 2015-05-30: qty 1

## 2015-05-30 MED ORDER — DIPHENHYDRAMINE HCL 25 MG PO CAPS: 25.0000 mg | ORAL_CAPSULE | Freq: Every evening | ORAL | Status: DC | PRN

## 2015-05-30 NOTE — Clinical Documentation Improvement (Signed)
Internal Medicine  Can the diagnosis of anemia be further specified in progress notes and discharge summary?   Acute blood loss anemia  Other  Clinically Undetermined  Document any associated diagnoses/conditions.   Supporting Information: Chief Complaint  Patient presents with  . Rectal Bleeding  . Abdominal Pain       2/20 progress note: admitted on 2/19 with 1 day of weakness, nausea and black tarry stool. Patient takes NSAIDs on a regular basis. No abdominal pain or hematemesis. In the emergency room, patient noted to have a hemoglobin of 9 but following IV fluids, several hours later, hemoglobin dropped to 7.2. Patient transfused 2 units packed red blood cells and following transfusion, hemoglobin at 9. This morning, hemoglobin unchanged Procedures:  Transfuse 2 units packed red blood cells 2/19 Component     Latest Ref Rng 05/29/2015 05/29/2015 05/30/2015 05/30/2015         5:35 PM  9:34 PM  5:07 AM  9:44 AM  Hemoglobin     12.0 - 15.0 g/dL 9.0 (L) 7.2 (L) 10.1 (L) 11.3 (L)   Component     Latest Ref Rng 05/29/2015 05/29/2015 05/30/2015 05/30/2015         5:35 PM  9:34 PM  5:07 AM  9:44 AM  HCT     36.0 - 46.0 % 26.3 (L) 21.7 (L) 30.1 (L) 34.7 (L)   Please exercise your independent, professional judgment when responding. A specific answer is not anticipated or expected.   Thank You,  [place] [phone]

## 2015-05-30 NOTE — Care Management Note (Signed)
Case Management Note  Patient Details  Name: [name] MRN: [id number] Date of Birth: November 29, 1930  Subjective/Objective:82 y/o f admitted w/GIB. From home.                    Action/Plan:d/c plan home.   Expected Discharge Date:   (unknown)               Expected Discharge Plan:  Home/Self Care  In-House Referral:     Discharge planning Services  CM Consult  Post Acute Care Choice:    Choice offered to:     DME Arranged:    DME Agency:     HH Arranged:    HH Agency:     Status of Service:  In process, will continue to follow  Medicare Important Message Given:    Date Medicare IM Given:    Medicare IM give by:    Date Additional Medicare IM Given:    Additional Medicare Important Message give by:     If discussed at [place] of Stay Meetings, dates discussed:    Additional Comments:  Dessa Phi, RN 05/30/2015, 3:18 PM

## 2015-05-30 NOTE — ED Notes (Signed)
Blood culture order completed in error

## 2015-05-30 NOTE — ED Notes (Signed)
Hemoglobin and Hematocrit NOT DRAWN at this time---- pt is currently receiving 2nd unit of RBC.

## 2015-05-30 NOTE — Progress Notes (Signed)
PROGRESS NOTE  [name] WUJ:[id number] DOB: 09-05-1930 DOA: 05/29/2015 PCP: Junie Panning, NP  HPI/Recap of past 97 hours: 82 year old female with past medical history of CAD on Plavix admitted on 2/19 with 1 day of weakness, nausea and black tarry stool. Patient takes NSAIDs on a regular basis. No abdominal pain or hematemesis. In the emergency room, patient noted to have a hemoglobin of 9 but following IV fluids, several hours later, hemoglobin dropped to 7.2. Patient transfused 2 units packed red blood cells and following transfusion, hemoglobin at 9. This morning, hemoglobin unchanged.  Assessment/Plan: Active Problems:   Hypothyroidism: Continue Synthroid   ANEMIA   CAD (coronary artery disease): Stable. Resume Plavix in several weeks   COPD (chronic obstructive pulmonary disease) ([place]): Stable   Leukocytosis: Likely secondary to stress margination, resolved   Acute upper GI bleed: Secondary to NSAID-induced ulcer. Bleeding looks to stabilize. Patient absolutely declines invasive endoscopy which I feel is reasonable. On PPI, discontinuing NSAIDs and if hemoglobin stable, anticipate discharge tomorrow Chronic diastolic heart failure: Checking BNP, concern for volume overload in the setting of blood transfusion  Code Status: DO NOT RESUSCITATE   Family Communication: Daughter at the bedside   Disposition Plan: Anticipate discharge tomorrow    Consultants:  None   Procedures:  Transfuse 2 units packed red blood cells 2/19   Antibiotics:  None    Objective: BP 121/63 mmHg  Pulse 76  Temp(Src) 98.2 F (36.8 C) (Oral)  Resp 18  SpO2 98%  Intake/Output Summary (Last 24 hours) at 05/30/15 1634 Last data filed at 05/30/15 1300  Gross per 24 hour  Intake   3700 ml  Output      0 ml  Net   3700 ml   There were no vitals filed for this visit.  Exam:   General:  Alert and oriented 3   Cardiovascular: Regular rate and rhythm, S1 and S2    Respiratory: Clear to auscultation bilaterally   Abdomen: Soft, nontender, nondistended, positive bowel sounds   Musculoskeletal: Trace pitting edema    Data Reviewed: Basic Metabolic Panel:  Recent Labs Lab 05/29/15 1735 05/30/15 0507  NA 141 143  K 3.5 3.6  CL 108 115*  CO2 23 23  GLUCOSE 146* 90  BUN 42* 23*  CREATININE 0.82 0.71  CALCIUM 9.6 8.1*   Liver Function Tests:  Recent Labs Lab 05/29/15 1735  AST 21  ALT 13*  ALKPHOS 59  BILITOT 0.4  PROT 6.3*  ALBUMIN 4.0   No results for input(s): LIPASE, AMYLASE in the last 168 hours. No results for input(s): AMMONIA in the last 168 hours. CBC:  Recent Labs Lab 05/29/15 1735 05/29/15 2134 05/30/15 0507 05/30/15 0944  WBC 11.2*  --   --   --   NEUTROABS 8.3*  --   --   --   HGB 9.0* 7.2* 10.1* 11.3*  HCT 26.3* 21.7* 30.1* 34.7*  MCV 85.4  --   --   --   PLT 259  --   --   --    Cardiac Enzymes:   No results for input(s): CKTOTAL, CKMB, CKMBINDEX, TROPONINI in the last 168 hours. BNP (last 3 results) No results for input(s): BNP in the last 8760 hours.  ProBNP (last 3 results) No results for input(s): PROBNP in the last 8760 hours.  CBG:  Recent Labs Lab 05/29/15 1815 05/30/15 0744  GLUCAP 123* 73    No results found for this or any previous visit (  from the past 240 hour(s)).   Studies: No results found.  Scheduled Meds: . sodium chloride   Intravenous Once  . citalopram  20 mg Oral Daily  . feeding supplement (ENSURE ENLIVE)  237 mL Oral TID BM  . felodipine  10 mg Oral Daily  . levothyroxine  100 mcg Oral QAC breakfast  . [START ON 06/02/2015] pantoprazole (PROTONIX) IV  40 mg Intravenous Q12H  . sodium chloride flush  3 mL Intravenous Q12H  . tiotropium  18 mcg Inhalation Daily    Continuous Infusions: . pantoprozole (PROTONIX) infusion 8 mg/hr (05/30/15 1441)     Time spent: 25 minutes   [facility] Pager [phone] . If 7PM-7AM, please contact  night-coverage at [email or web address], password [facility] 05/30/2015, 4:34 PM  LOS: 1 day

## 2015-05-30 NOTE — Progress Notes (Signed)
Pt tranferred from ED to 1510. Pt AO x 4. Pt made aware of the unit procdures. Bed in the lowest position and call bell within reach. No questions or concerns from the pt at this time. [name] W [name]

## 2015-05-31 DIAGNOSIS — D62 Acute posthemorrhagic anemia: Secondary | ICD-10-CM | POA: Diagnosis present

## 2015-05-31 DIAGNOSIS — E039 Hypothyroidism, unspecified: Secondary | ICD-10-CM

## 2015-05-31 LAB — BRAIN NATRIURETIC PEPTIDE: B NATRIURETIC PEPTIDE 5: 255.9 pg/mL — AB (ref 0.0–100.0)

## 2015-05-31 LAB — CBC
HCT: 31 % — ABNORMAL LOW (ref 36.0–46.0)
Hemoglobin: 10.2 g/dL — ABNORMAL LOW (ref 12.0–15.0)
MCH: 29 pg (ref 26.0–34.0)
MCHC: 32.9 g/dL (ref 30.0–36.0)
MCV: 88.1 fL (ref 78.0–100.0)
PLATELETS: 190 10*3/uL (ref 150–400)
RBC: 3.52 MIL/uL — AB (ref 3.87–5.11)
RDW: 16 % — ABNORMAL HIGH (ref 11.5–15.5)
WBC: 8.5 10*3/uL (ref 4.0–10.5)

## 2015-05-31 MED ORDER — ENSURE ENLIVE PO LIQD: 237.0000 mL | Freq: Three times a day (TID) | ORAL | Status: AC

## 2015-05-31 MED ORDER — ZOLPIDEM TARTRATE 5 MG PO TABS: 5.0000 mg | ORAL_TABLET | Freq: Every evening | ORAL | Status: AC | PRN

## 2015-05-31 MED ORDER — OMEPRAZOLE 20 MG PO CPDR: 20.0000 mg | DELAYED_RELEASE_CAPSULE | Freq: Every day | ORAL | Status: AC

## 2015-05-31 NOTE — Care Management Note (Signed)
Case Management Note  Patient Details  Name: [name] MRN: [id number] Date of Birth: 07/01/1930  Subjective/Objective:                    Action/Plan:d/c home no needs or orders.   Expected Discharge Date:   (unknown)               Expected Discharge Plan:  Home/Self Care  In-House Referral:     Discharge planning Services  CM Consult  Post Acute Care Choice:    Choice offered to:     DME Arranged:    DME Agency:     HH Arranged:    [place] Agency:     Status of Service:  Completed, signed off  Medicare Important Message Given:    Date Medicare IM Given:    Medicare IM give by:    Date Additional Medicare IM Given:    Additional Medicare Important Message give by:     If discussed at [place] of Stay Meetings, dates discussed:    Additional Comments:  Dessa Phi, RN 05/31/2015, 2:09 PM

## 2015-05-31 NOTE — Discharge Instructions (Signed)
STOP taking any aspirin, motrin, ibuprofen, advil, aleve or goody powders.  Ok to start plavix back on 07/09/15  Gastrointestinal Bleeding Gastrointestinal bleeding is bleeding somewhere along the path that food travels through the body (digestive tract). This path is anywhere between the mouth and the opening of the butt (anus). You may have blood in your throw up (vomit) or in your poop (stools). If there is a lot of bleeding, you may need to stay in the hospital. [place]  Only take medicine as told by your doctor.  Eat foods with fiber such as whole grains, fruits, and vegetables. You can also try eating 1 to 3 prunes a day.  Drink enough fluids to keep your pee (urine) clear or pale yellow. GET HELP RIGHT AWAY IF:   Your bleeding gets worse.  You feel dizzy, weak, or you pass out (faint).  You have bad cramps in your back or belly (abdomen).  You have large blood clumps (clots) in your poop.  Your problems are getting worse. MAKE SURE YOU:   Understand these instructions.  Will watch your condition.  Will get help right away if you are not doing well or get worse.   This information is not intended to replace advice given to you by your health care provider. Make sure you discuss any questions you have with your health care provider.   Document Released: 01/03/2008 Document Revised: 03/12/2012 Document Reviewed: 09/13/2014 Elsevier Interactive Patient Education [employer].

## 2015-05-31 NOTE — Progress Notes (Signed)
Patient given discharge instructions, and verbalized an understanding of all discharge instructions.  Patient agrees with discharge plan, and is being discharged in stable medical condition.  Patient given transportation via wheelchair. 

## 2015-05-31 NOTE — Discharge Summary (Signed)
Discharge Summary  [name] [id number]:[id number] DOB: [birth year]  PCP: Junie Panning, NP  Admit date: 05/29/2015 Discharge date: 05/31/2015  Time spent: 25 minutes   Recommendations for Outpatient Follow-up:  1. New medication: Prilosec OTC by mouth daily 2. Medication change: Patient will resume Plavix on 5/1 3. Patient advised to discontinue all NSAIDs including Advil, Aleve, Motrin and aspirin   Discharge Diagnoses:  Active Hospital Problems   Diagnosis Date Noted  . Acute upper gastrointestinal bleeding 05/29/2015  . Leukocytosis 05/29/2015  . Acute upper GI bleed 05/29/2015  . COPD (chronic obstructive pulmonary disease) ([place]) 11/01/2013  . ANEMIA 06/25/2007  . Hypothyroidism 06/25/2007  . CAD (coronary artery disease) 06/25/2007    Resolved Hospital Problems   Diagnosis Date Noted Date Resolved  No resolved problems to display.    Discharge Condition: Improved, being discharged home   Diet recommendation: Heart healthy   Filed Weights   05/31/15 0000  Weight: 49.351 kg (108 lb 12.8 oz)    History of present illness:  82 year old female with past medical history of CAD on Plavix admitted on 2/19 with 1 day of weakness, nausea and black tarry stool. Patient takes NSAIDs on a regular basis. No abdominal pain or hematemesis. In the emergency room, patient noted to have a hemoglobin of 9 but following IV fluids, several hours later, hemoglobin dropped to 7.2     Hospital Course:  Principal Problem:   Acute upper gastrointestinal bleeding causing acute blood loss anemia: Cause was felt to be secondary to NSAID-induced ulcer. Patient was adamant about no invasive procedures like endoscopy, so patient managed conservatively. Initially put on Protonix drip. Following 2 unit blood cell transfusion, hemoglobin improved up to 9 and stayed stable during her hospitalization.  Plavix was held. By 2/21, patient stable. Hemoglobin did not drop further. Blood pressure  stable. Protonix drip changed over to oral PPI. Patient will be discharged on oral PPI, discontinue all NSAIDs and resume her Plavix in approximately 5 weeks on 4/1 Active Problems:   Hypothyroidism: Continue Synthroid   ANEMIA   CAD (coronary artery disease)   COPD (chronic obstructive pulmonary disease) ([facility])   Leukocytosis: Secondary to stress margination from bleed      Procedures:  None   Consultations:  None   Discharge Exam: BP 127/64 mmHg  Pulse 79  Temp(Src) 98.4 F (36.9 C) (Oral)  Resp 19  Ht '5\' 4"'$  (1.626 m)  Wt 49.351 kg (108 lb 12.8 oz)  BMI 18.67 kg/m2  SpO2 95%  General: Alert and oriented 3  Cardiovascular: Regular rate and rhythm, S1-S2  Respiratory: Clear to auscultation bilaterally   Discharge Instructions You were cared for by a hospitalist during your hospital stay. If you have any questions about your discharge medications or the care you received while you were in the hospital after you are discharged, you can call the unit and asked to speak with the hospitalist on call if the hospitalist that took care of you is not available. Once you are discharged, your primary care physician will handle any further medical issues. Please note that NO REFILLS for any discharge medications will be authorized once you are discharged, as it is imperative that you return to your primary care physician (or establish a relationship with a primary care physician if you do not have one) for your aftercare needs so that they can reassess your need for medications and monitor your lab values.  Discharge Instructions    Diet - low sodium heart healthy  Complete by:  As directed      Increase activity slowly    Complete by:  As directed             Medication List    STOP taking these medications        clopidogrel 75 MG tablet  Commonly known as:  PLAVIX     diphenhydramine-acetaminophen 25-500 MG Tabs tablet  Commonly known as:  TYLENOL PM      oxyCODONE-acetaminophen 5-325 MG tablet  Commonly known as:  PERCOCET/ROXICET      TAKE these medications        budesonide-formoterol 160-4.5 MCG/ACT inhaler  Commonly known as:  SYMBICORT  Inhale 2 puffs into the lungs 2 (two) times daily as needed (for shortness of breath).     citalopram 20 MG tablet  Commonly known as:  CELEXA  Take 20 mg by mouth daily.     feeding supplement (ENSURE ENLIVE) Liqd  Take 237 mLs by mouth 3 (three) times daily between meals.     felodipine 5 MG 24 hr tablet  Commonly known as:  PLENDIL  Take 10 mg by mouth daily.     hydrOXYzine 25 MG tablet  Commonly known as:  ATARAX/VISTARIL  Take 25-50 mg by mouth daily as needed (sleep).     ipratropium-albuterol 0.5-2.5 (3) MG/3ML Soln  Commonly known as:  DUONEB  Take 3 mLs by nebulization every 6 (six) hours as needed (shortness of breath).     levothyroxine 100 MCG tablet  Commonly known as:  SYNTHROID, LEVOTHROID  TAKE 1 TABLET BY MOUTH EVERY MORNING BEFORE BREAKFAST     nitroGLYCERIN 0.4 MG SL tablet  Commonly known as:  NITROSTAT  Place 0.4 mg under the tongue every 5 (five) minutes as needed for chest pain.     omeprazole 20 MG capsule  Commonly known as:  PRILOSEC  Take 1 capsule (20 mg total) by mouth daily.     potassium chloride 10 MEQ CR capsule  Commonly known as:  MICRO-K  Take 10 mEq by mouth daily.     tiotropium 18 MCG inhalation capsule  Commonly known as:  SPIRIVA  Place 1 capsule (18 mcg total) into inhaler and inhale daily.     zolpidem 5 MG tablet  Commonly known as:  AMBIEN  Take 1 tablet (5 mg total) by mouth at bedtime as needed for sleep.       Allergies  Allergen Reactions  . Aspirin     GI  Bleed  . Penicillins     Numbness around mouth  Has patient had a PCN reaction causing immediate rash, facial/tongue/throat swelling, SOB or lightheadedness with hypotension: Yes Has patient had a PCN reaction causing severe rash involving mucus membranes or skin  necrosis: No Has patient had a PCN reaction that required hospitalization No Has patient had a PCN reaction occurring within the last 10 years: No If all of the above answers are "NO", then may proceed with Cephalosporin use.    . Biaxin [Clarithromycin] Nausea Only  . Sulfonamide Derivatives Nausea And Vomiting  . Codeine Nausea And Vomiting  . Hydrocodone Nausea And Vomiting  . Morphine And Related Nausea Only    Nausea   . Sulfa Antibiotics Nausea And Vomiting  . Xanax [Alprazolam]     unknown       Follow-up Information    Follow up with Smothers, Andree Elk, NP In 1 month.   Specialty:  Nurse Practitioner   Contact information:  [address] [place] [address] [phone]        The results of significant diagnostics from this hospitalization (including imaging, microbiology, ancillary and laboratory) are listed [place] for reference.    Significant Diagnostic Studies: No results found.  Microbiology: No results found for this or any previous visit (from the past 240 hour(s)).   Labs: Basic Metabolic Panel:  Recent Labs Lab 05/29/15 1735 05/30/15 0507  NA 141 143  K 3.5 3.6  CL 108 115*  CO2 23 23  GLUCOSE 146* 90  BUN 42* 23*  CREATININE 0.82 0.71  CALCIUM 9.6 8.1*   Liver Function Tests:  Recent Labs Lab 05/29/15 1735  AST 21  ALT 13*  ALKPHOS 59  BILITOT 0.4  PROT 6.3*  ALBUMIN 4.0   No results for input(s): LIPASE, AMYLASE in the last 168 hours. No results for input(s): AMMONIA in the last 168 hours. CBC:  Recent Labs Lab 05/29/15 1735 05/29/15 2134 05/30/15 0507 05/30/15 0944 05/31/15 0553  WBC 11.2*  --   --   --  8.5  NEUTROABS 8.3*  --   --   --   --   HGB 9.0* 7.2* 10.1* 11.3* 10.2*  HCT 26.3* 21.7* 30.1* 34.7* 31.0*  MCV 85.4  --   --   --  88.1  PLT 259  --   --   --  190   Cardiac Enzymes: No results for input(s): CKTOTAL, CKMB, CKMBINDEX, TROPONINI in the last 168 hours. BNP: BNP (last 3  results)  Recent Labs  05/31/15 0553  BNP 255.9*    ProBNP (last 3 results) No results for input(s): PROBNP in the last 8760 hours.  CBG:  Recent Labs Lab 05/29/15 1815 05/30/15 0744  GLUCAP 123* 73       Signed:  Michail Boyte K  [facility] 05/31/2015, 4:08 PM

## 2015-05-31 NOTE — Progress Notes (Signed)
PT Cancellation Note  Patient Details Name: [name] MRN: [id number] DOB: 12-13-1930   Cancelled Treatment:    Reason Eval/Treat Not Completed: PT screened, no needs identified, will sign off;   Spoke with pt, she states she is amb with walker, feels she is doing fine and has a RW at home if needed; to D/C today   [facility] 05/31/2015, 2:01 PM

## 2015-06-02 LAB — TYPE AND SCREEN
ABO/RH(D): O POS
Antibody Screen: POSITIVE
DAT, IgG: NEGATIVE
DONOR AG TYPE: NEGATIVE
DONOR AG TYPE: NEGATIVE
Donor AG Type: NEGATIVE
Donor AG Type: NEGATIVE
Donor AG Type: NEGATIVE
Donor AG Type: NEGATIVE
UNIT DIVISION: 0
UNIT DIVISION: 0
UNIT DIVISION: 0
UNIT DIVISION: 0
UNIT DIVISION: 0
Unit division: 0

## 2015-08-16 ENCOUNTER — Inpatient Hospital Stay (HOSPITAL_COMMUNITY)
Admission: EM | Admit: 2015-08-16 | Discharge: 2015-08-22 | DRG: 480 | Disposition: A | Discharge status: Skilled Nursing Facility | Payer: Medicare Other | Attending: Internal Medicine | Admitting: Internal Medicine

## 2015-08-16 ENCOUNTER — Encounter (HOSPITAL_COMMUNITY): Payer: Self-pay | Admitting: Emergency Medicine

## 2015-08-16 ENCOUNTER — Emergency Department (HOSPITAL_COMMUNITY): Payer: Medicare Other

## 2015-08-16 DIAGNOSIS — Z7902 Long term (current) use of antithrombotics/antiplatelets: Secondary | ICD-10-CM

## 2015-08-16 DIAGNOSIS — M25551 Pain in right hip: Secondary | ICD-10-CM | POA: Diagnosis not present

## 2015-08-16 DIAGNOSIS — S72141A Displaced intertrochanteric fracture of right femur, initial encounter for closed fracture: Principal | ICD-10-CM | POA: Diagnosis present

## 2015-08-16 DIAGNOSIS — E8770 Fluid overload, unspecified: Secondary | ICD-10-CM | POA: Diagnosis present

## 2015-08-16 DIAGNOSIS — Z841 Family history of disorders of kidney and ureter: Secondary | ICD-10-CM

## 2015-08-16 DIAGNOSIS — C799 Secondary malignant neoplasm of unspecified site: Secondary | ICD-10-CM

## 2015-08-16 DIAGNOSIS — Z809 Family history of malignant neoplasm, unspecified: Secondary | ICD-10-CM

## 2015-08-16 DIAGNOSIS — D72829 Elevated white blood cell count, unspecified: Secondary | ICD-10-CM | POA: Diagnosis present

## 2015-08-16 DIAGNOSIS — Z881 Allergy status to other antibiotic agents status: Secondary | ICD-10-CM

## 2015-08-16 DIAGNOSIS — M1990 Unspecified osteoarthritis, unspecified site: Secondary | ICD-10-CM | POA: Diagnosis present

## 2015-08-16 DIAGNOSIS — Z885 Allergy status to narcotic agent status: Secondary | ICD-10-CM

## 2015-08-16 DIAGNOSIS — E039 Hypothyroidism, unspecified: Secondary | ICD-10-CM | POA: Diagnosis present

## 2015-08-16 DIAGNOSIS — Z419 Encounter for procedure for purposes other than remedying health state, unspecified: Secondary | ICD-10-CM

## 2015-08-16 DIAGNOSIS — Z888 Allergy status to other drugs, medicaments and biological substances status: Secondary | ICD-10-CM

## 2015-08-16 DIAGNOSIS — D649 Anemia, unspecified: Secondary | ICD-10-CM | POA: Diagnosis present

## 2015-08-16 DIAGNOSIS — Z8249 Family history of ischemic heart disease and other diseases of the circulatory system: Secondary | ICD-10-CM

## 2015-08-16 DIAGNOSIS — Z79899 Other long term (current) drug therapy: Secondary | ICD-10-CM

## 2015-08-16 DIAGNOSIS — R0602 Shortness of breath: Secondary | ICD-10-CM

## 2015-08-16 DIAGNOSIS — J449 Chronic obstructive pulmonary disease, unspecified: Secondary | ICD-10-CM | POA: Diagnosis present

## 2015-08-16 DIAGNOSIS — I2510 Atherosclerotic heart disease of native coronary artery without angina pectoris: Secondary | ICD-10-CM | POA: Diagnosis present

## 2015-08-16 DIAGNOSIS — F411 Generalized anxiety disorder: Secondary | ICD-10-CM | POA: Diagnosis present

## 2015-08-16 DIAGNOSIS — S72001A Fracture of unspecified part of neck of right femur, initial encounter for closed fracture: Secondary | ICD-10-CM

## 2015-08-16 DIAGNOSIS — I252 Old myocardial infarction: Secondary | ICD-10-CM

## 2015-08-16 DIAGNOSIS — K219 Gastro-esophageal reflux disease without esophagitis: Secondary | ICD-10-CM | POA: Diagnosis present

## 2015-08-16 DIAGNOSIS — F329 Major depressive disorder, single episode, unspecified: Secondary | ICD-10-CM | POA: Diagnosis present

## 2015-08-16 DIAGNOSIS — W010XXA Fall on same level from slipping, tripping and stumbling without subsequent striking against object, initial encounter: Secondary | ICD-10-CM | POA: Diagnosis present

## 2015-08-16 DIAGNOSIS — R413 Other amnesia: Secondary | ICD-10-CM | POA: Diagnosis present

## 2015-08-16 DIAGNOSIS — Z9981 Dependence on supplemental oxygen: Secondary | ICD-10-CM

## 2015-08-16 DIAGNOSIS — W19XXXA Unspecified fall, initial encounter: Secondary | ICD-10-CM | POA: Diagnosis present

## 2015-08-16 DIAGNOSIS — Z882 Allergy status to sulfonamides status: Secondary | ICD-10-CM

## 2015-08-16 DIAGNOSIS — J9621 Acute and chronic respiratory failure with hypoxia: Secondary | ICD-10-CM | POA: Diagnosis not present

## 2015-08-16 DIAGNOSIS — G4700 Insomnia, unspecified: Secondary | ICD-10-CM | POA: Diagnosis present

## 2015-08-16 DIAGNOSIS — I6529 Occlusion and stenosis of unspecified carotid artery: Secondary | ICD-10-CM | POA: Diagnosis present

## 2015-08-16 DIAGNOSIS — I739 Peripheral vascular disease, unspecified: Secondary | ICD-10-CM | POA: Diagnosis present

## 2015-08-16 DIAGNOSIS — I10 Essential (primary) hypertension: Secondary | ICD-10-CM | POA: Diagnosis present

## 2015-08-16 DIAGNOSIS — D62 Acute posthemorrhagic anemia: Secondary | ICD-10-CM | POA: Diagnosis not present

## 2015-08-16 DIAGNOSIS — Z886 Allergy status to analgesic agent status: Secondary | ICD-10-CM

## 2015-08-16 DIAGNOSIS — Z66 Do not resuscitate: Secondary | ICD-10-CM | POA: Diagnosis present

## 2015-08-16 DIAGNOSIS — Z87891 Personal history of nicotine dependence: Secondary | ICD-10-CM

## 2015-08-16 DIAGNOSIS — Z880 Allergy status to penicillin: Secondary | ICD-10-CM

## 2015-08-16 DIAGNOSIS — Y92007 Garden or yard of unspecified non-institutional (private) residence as the place of occurrence of the external cause: Secondary | ICD-10-CM

## 2015-08-16 NOTE — ED Notes (Signed)
Bed: PF29 Expected date:  Expected time:  Means of arrival:  Comments: Fall outside, hip pain

## 2015-08-16 NOTE — ED Notes (Addendum)
Pt BIB EMS from home; pt fell around 8pm outside her home when she got tangled in her dogs leash; pt laid there until she was found by neighbor at 10pm; pt denied LOC and hitting head; pt states she usually walks with a walker; pt c/o right hip pain; no obvious deformity but very painful on palpation; pt usually takes oxycodone for back pain but denies taking any today; pt received 273mg of Fentanyl en route.  Pt on 2L O2 at home.

## 2015-08-16 NOTE — ED Provider Notes (Signed)
CSN: [id number]     Arrival date & time 08/16/15  2317 History  By signing my name [place], I, Rowan Blase, attest that this documentation has been prepared under the direction and in the presence of Merryl Hacker, MD . Electronically Signed: Rowan Blase, Scribe. 08/17/2015. 12:05 AM.   Chief Complaint  Patient presents with  . Hip Pain  . Fall   The history is provided by the patient. No language interpreter was used.   HPI Comments:  Joyanne Eddinger Fiebig is a 82 y.o. female with PMHx of COPD, carotid artery occlusion, MI, HTN and CAD who presents to the Emergency Department via EMS s/p fall ~3.5 hours ago. Pt states she bent down to grab her dog's leash and fell on her right side, hitting her head on the cement. Pt is complaining of 9/10 right hip pain, lower right leg pain, and tingling in lower right leg. Pt was given IV Fentanyl by EMS PTA. She is on 2L [place] at home. Denies syncope, use of blood thinners or neck pain.  Past Medical History  Diagnosis Date  . COPD (chronic obstructive pulmonary disease) ([place])   . Carotid artery occlusion   . Arthritis   . Anxiety     Afraid , living alone  . Myocardial infarction ([place]) 1987  . Anemia   . Bilateral leg pain     for years  . Femoral bruit   . Bronchitis, chronic ([place])   . Fall April 2014    Pt. has fallen twife in the last 2 months.   . Personal history of other diseases of digestive system     Upper GI bleed  . Generalized anxiety disorder   . Nonspecific abnormal electrocardiogram (ECG) (EKG)     Abnormal Results EKG  . Memory loss     Worsening  . Intestinal infection due to Clostridium difficile   . Colitis due to Clostridium difficile   . Hypertension   . CAD (coronary artery disease)   . Thyroid disease     Hypo-Thyroidism   Past Surgical History  Procedure Laterality Date  . Carotid endarterectomy  12/30/2008  . Abdominal aortagram N/A 09/19/2012    Procedure: ABDOMINAL Maxcine Ham;  Surgeon: Elam Dutch,  MD;  Location: [facility] CATH LAB;  Service: Cardiovascular;  Laterality: N/A;  . Lower extremity angiogram Bilateral 09/19/2012    Procedure: LOWER EXTREMITY ANGIOGRAM;  Surgeon: Elam Dutch, MD;  Location: [facility] CATH LAB;  Service: Cardiovascular;  Laterality: Bilateral;   Family History  Problem Relation Age of Onset  . Cancer Mother   . Deep vein thrombosis Mother   . Cancer Father   . Kidney disease Father   . Heart disease Father    Social History  Substance Use Topics  . Smoking status: Former Smoker -- 15 years    Types: Cigarettes    Quit date: 04/09/1984  . Smokeless tobacco: Never Used  . Alcohol Use: No   OB History    No data available     Review of Systems  Respiratory: Negative for chest tightness.   Cardiovascular: Negative for chest pain.  Gastrointestinal: Negative for abdominal pain.  Musculoskeletal: Positive for arthralgias. Negative for neck pain.  Neurological: Positive for numbness. Negative for syncope and weakness.  All other systems reviewed and are negative.    Allergies  Aspirin; Penicillins; Biaxin; Sulfonamide derivatives; Buprenorphine; Codeine; Hydrocodone; Morphine and related; Sulfa antibiotics; and Xanax  Home Medications   Prior to Admission medications   Medication  Sig Start Date End Date Taking? Authorizing Provider  budesonide-formoterol (SYMBICORT) 160-4.5 MCG/ACT inhaler Inhale 2 puffs into the lungs 2 (two) times daily as needed (for shortness of breath).    Yes Historical Provider, MD  busPIRone (BUSPAR) 7.5 MG tablet Take 7.5 mg by mouth 2 (two) times daily. 06/10/15  Yes Historical Provider, MD  citalopram (CELEXA) 20 MG tablet Take 20 mg by mouth daily.   Yes Historical Provider, MD  clopidogrel (PLAVIX) 75 MG tablet Take 75 mg by mouth daily. 08/12/15  Yes Historical Provider, MD  [employer] SENNA PLUS 8.6-50 MG tablet Take 1 tablet by mouth 2 (two) times daily. 06/10/15  Yes Historical Provider, MD  feeding supplement, ENSURE ENLIVE, (ENSURE  ENLIVE) LIQD Take 237 mLs by mouth 3 (three) times daily between meals. 05/31/15  Yes Annita Brod, MD  felodipine (PLENDIL) 5 MG 24 hr tablet Take 10 mg by mouth daily.    Yes Historical Provider, MD  hydrOXYzine (ATARAX/VISTARIL) 25 MG tablet Take 25-50 mg by mouth daily as needed (sleep).  05/28/15  Yes Historical Provider, MD  INCRUSE ELLIPTA 62.5 MCG/INH AEPB Inhale 1 puff into the lungs daily. 08/12/15  Yes Historical Provider, MD  ipratropium-albuterol (DUONEB) 0.5-2.5 (3) MG/3ML SOLN Take 3 mLs by nebulization every 6 (six) hours as needed (shortness of breath).  03/10/15  Yes Historical Provider, MD  levothyroxine (SYNTHROID, LEVOTHROID) 88 MCG tablet Take 88 mcg by mouth daily. 08/12/15  Yes Historical Provider, MD  nitroGLYCERIN (NITROSTAT) 0.4 MG SL tablet Place 0.4 mg under the tongue every 5 (five) minutes as needed for chest pain.   Yes Historical Provider, MD  omeprazole (PRILOSEC) 20 MG capsule Take 1 capsule (20 mg total) by mouth daily. Patient taking differently: Take 20 mg by mouth daily as needed (acid reflux).  05/31/15  Yes Annita Brod, MD  oxyCODONE-acetaminophen (PERCOCET/ROXICET) 5-325 MG tablet Take 1 tablet by mouth daily as needed. 08/04/15  Yes Historical Provider, MD  potassium chloride (MICRO-K) 10 MEQ CR capsule Take 10 mEq by mouth daily.   Yes Historical Provider, MD  tiotropium (SPIRIVA) 18 MCG inhalation capsule Place 1 capsule (18 mcg total) into inhaler and inhale daily. 11/04/13  Yes Eugenie Filler, MD  zolpidem (AMBIEN) 5 MG tablet Take 1 tablet (5 mg total) by mouth at bedtime as needed for sleep. 05/31/15  Yes Annita Brod, MD  levothyroxine (SYNTHROID, LEVOTHROID) 100 MCG tablet TAKE 1 TABLET BY MOUTH EVERY MORNING BEFORE BREAKFAST Patient not taking: Reported on 08/17/2015 03/31/14   Estill Dooms, MD   BP 140/73 mmHg  Pulse 98  Temp(Src) 98.8 F (37.1 C)  Resp 18  SpO2 96% Physical Exam  Constitutional: She is oriented to person, place,  and time. No distress.  Chronically ill-appearing, no acute distress  HENT:  Head: Normocephalic and atraumatic.  Neck: Normal range of motion. Neck supple.  No midline C-spine tenderness  Cardiovascular: Normal rate, regular rhythm and normal heart sounds.   No murmur heard. Pulmonary/Chest: Effort normal. No respiratory distress. She has wheezes.  Diffuse expiratory wheezing  Abdominal: Soft. Bowel sounds are normal. There is no tenderness. There is no rebound.  Musculoskeletal: She exhibits no edema.  Tenderness palpation right lateral hip, no obvious foreshortening; exam is limited secondary to patient position of comfort, 2+ DP pulse  Neurological: She is alert and oriented to person, place, and time.  Skin: Skin is warm and dry.  Psychiatric: She has a normal mood and affect.  Nursing note and  vitals reviewed.   ED Course  Procedures  DIAGNOSTIC STUDIES:  Oxygen Saturation is 94% on 2 L/min [place], adequate by my interpretation.    COORDINATION OF CARE:  12:01 AM Will administer pain medication. Discussed treatment plan with pt at bedside and pt agreed to plan.  12:19 AM Consult to Dr. Lorin Mercy, ortho.  Labs Review Labs Reviewed  BASIC METABOLIC PANEL - Abnormal; Notable for the following:    Glucose, Bld 171 (*)    All other components within normal limits  CBC WITH DIFFERENTIAL/PLATELET - Abnormal; Notable for the following:    WBC 15.1 (*)    RBC 3.73 (*)    Hemoglobin 11.2 (*)    HCT 34.1 (*)    Neutro Abs 14.2 (*)    Lymphs Abs 0.5 (*)    All other components within normal limits  PROTIME-INR  TYPE AND SCREEN    Imaging Review Dg Chest 2 View  08/17/2015  CLINICAL DATA:  Dyspnea.  Hip fracture. EXAM: CHEST  2 VIEW COMPARISON:  None. FINDINGS: A single supine AP view of the chest is negative for pneumothorax or large effusion. Mediastinal contours are normal. The lungs are clear. The pulmonary vasculature is normal. IMPRESSION: No acute cardiopulmonary findings.  Electronically Signed   By: Andreas Newport M.D.   On: 08/17/2015 01:17   Ct Head Wo Contrast  08/17/2015  CLINICAL DATA:  [place] 3 hours ago. EXAM: CT HEAD WITHOUT CONTRAST TECHNIQUE: Contiguous axial images were obtained from the base of the skull through the vertex without intravenous contrast. COMPARISON:  11/03/2013 FINDINGS: There is no intracranial hemorrhage, mass or evidence of acute infarction. There is moderate generalized atrophy. There is moderate chronic microvascular ischemic change. There is remote lacunar infarction in the right caudate and right putamen. There is no significant extra-axial fluid collection. No acute intracranial findings are evident. The calvarium and skullbase are intact. The visible paranasal sinuses and orbits are unremarkable. IMPRESSION: No acute intracranial findings. There is moderate generalized atrophy, remote lacunar infarctions, and chronic appearing white matter hypodensities which likely represent small vessel ischemic disease. Electronically Signed   By: Andreas Newport M.D.   On: 08/17/2015 00:52   Dg Hip Unilat  With Pelvis 2-3 Views Right  08/16/2015  CLINICAL DATA:  [place] while walking her dog at 20:00. EXAM: DG HIP (WITH OR WITHOUT PELVIS) 2-3V RIGHT COMPARISON:  None. FINDINGS: There is an intertrochanteric right hip fracture, well aligned. No dislocation. Moderate right hip arthritis. Left hip is moderately arthritic as well. IMPRESSION: Intertrochanteric right hip fracture. Electronically Signed   By: Andreas Newport M.D.   On: 08/16/2015 23:59   I have personally reviewed and evaluated these images and lab results as part of my medical decision-making.   EKG Interpretation   Date/Time:  Wednesday Aug 17 2015 00:25:48 EDT Ventricular Rate:  101 PR Interval:  161 QRS Duration: 82 QT Interval:  363 QTC Calculation: 470 R Axis:   73 Text Interpretation:  Sinus tachycardia Low voltage, precordial leads  Confirmed by Makenzye Troutman  MD, Shonte Soderlund  ([id number]) on 08/17/2015 1:33:08 AM      MDM   Final diagnoses:  Closed right hip fracture, initial encounter [facility])    Patient presents with right hip pain following a fall. He is otherwise nontoxic. ABCs intact. Extensive pulmonary and cardiac history. X-ray shows a right intertrochanteric hip fracture. Discussed with Dr. Lorin Mercy. He will evaluate the patient tomorrow. Otherwise basic labwork was obtained. This is reassuring. CT head is negative for acute  injury and chest x-ray shows no evidence of pneumonia. Will admit to the hospitalist for further inpatient management.  I personally performed the services described in this documentation, which was scribed in my presence. The recorded information has been reviewed and is accurate.    Merryl Hacker, MD 08/17/15 [phone]

## 2015-08-17 ENCOUNTER — Emergency Department (HOSPITAL_COMMUNITY): Payer: Medicare Other

## 2015-08-17 ENCOUNTER — Inpatient Hospital Stay (HOSPITAL_COMMUNITY): Payer: Medicare Other

## 2015-08-17 ENCOUNTER — Inpatient Hospital Stay (HOSPITAL_COMMUNITY): Payer: Medicare Other | Admitting: Anesthesiology

## 2015-08-17 ENCOUNTER — Other Ambulatory Visit (HOSPITAL_COMMUNITY): Payer: Self-pay | Admitting: Family

## 2015-08-17 ENCOUNTER — Encounter (HOSPITAL_COMMUNITY)
Admission: EM | Disposition: A | Discharge status: Skilled Nursing Facility | Payer: Self-pay | Source: Home / Self Care | Attending: Internal Medicine

## 2015-08-17 DIAGNOSIS — K219 Gastro-esophageal reflux disease without esophagitis: Secondary | ICD-10-CM | POA: Diagnosis present

## 2015-08-17 DIAGNOSIS — D649 Anemia, unspecified: Secondary | ICD-10-CM | POA: Diagnosis not present

## 2015-08-17 DIAGNOSIS — Z882 Allergy status to sulfonamides status: Secondary | ICD-10-CM | POA: Diagnosis not present

## 2015-08-17 DIAGNOSIS — R0600 Dyspnea, unspecified: Secondary | ICD-10-CM | POA: Diagnosis not present

## 2015-08-17 DIAGNOSIS — J438 Other emphysema: Secondary | ICD-10-CM | POA: Diagnosis not present

## 2015-08-17 DIAGNOSIS — E039 Hypothyroidism, unspecified: Secondary | ICD-10-CM | POA: Diagnosis present

## 2015-08-17 DIAGNOSIS — Z9981 Dependence on supplemental oxygen: Secondary | ICD-10-CM | POA: Diagnosis not present

## 2015-08-17 DIAGNOSIS — I739 Peripheral vascular disease, unspecified: Secondary | ICD-10-CM | POA: Diagnosis present

## 2015-08-17 DIAGNOSIS — W010XXA Fall on same level from slipping, tripping and stumbling without subsequent striking against object, initial encounter: Secondary | ICD-10-CM | POA: Diagnosis present

## 2015-08-17 DIAGNOSIS — S72001A Fracture of unspecified part of neck of right femur, initial encounter for closed fracture: Secondary | ICD-10-CM | POA: Diagnosis present

## 2015-08-17 DIAGNOSIS — G4700 Insomnia, unspecified: Secondary | ICD-10-CM | POA: Diagnosis present

## 2015-08-17 DIAGNOSIS — Z888 Allergy status to other drugs, medicaments and biological substances status: Secondary | ICD-10-CM | POA: Diagnosis not present

## 2015-08-17 DIAGNOSIS — D72829 Elevated white blood cell count, unspecified: Secondary | ICD-10-CM | POA: Diagnosis present

## 2015-08-17 DIAGNOSIS — Y92007 Garden or yard of unspecified non-institutional (private) residence as the place of occurrence of the external cause: Secondary | ICD-10-CM | POA: Diagnosis not present

## 2015-08-17 DIAGNOSIS — Z7902 Long term (current) use of antithrombotics/antiplatelets: Secondary | ICD-10-CM | POA: Diagnosis not present

## 2015-08-17 DIAGNOSIS — Z880 Allergy status to penicillin: Secondary | ICD-10-CM | POA: Diagnosis not present

## 2015-08-17 DIAGNOSIS — Z841 Family history of disorders of kidney and ureter: Secondary | ICD-10-CM | POA: Diagnosis not present

## 2015-08-17 DIAGNOSIS — I6529 Occlusion and stenosis of unspecified carotid artery: Secondary | ICD-10-CM | POA: Diagnosis present

## 2015-08-17 DIAGNOSIS — E8770 Fluid overload, unspecified: Secondary | ICD-10-CM | POA: Diagnosis present

## 2015-08-17 DIAGNOSIS — Z79899 Other long term (current) drug therapy: Secondary | ICD-10-CM | POA: Diagnosis not present

## 2015-08-17 DIAGNOSIS — J9621 Acute and chronic respiratory failure with hypoxia: Secondary | ICD-10-CM | POA: Diagnosis not present

## 2015-08-17 DIAGNOSIS — Z886 Allergy status to analgesic agent status: Secondary | ICD-10-CM | POA: Diagnosis not present

## 2015-08-17 DIAGNOSIS — R413 Other amnesia: Secondary | ICD-10-CM | POA: Diagnosis present

## 2015-08-17 DIAGNOSIS — S72141A Displaced intertrochanteric fracture of right femur, initial encounter for closed fracture: Secondary | ICD-10-CM | POA: Diagnosis not present

## 2015-08-17 DIAGNOSIS — M25551 Pain in right hip: Secondary | ICD-10-CM | POA: Diagnosis present

## 2015-08-17 DIAGNOSIS — D62 Acute posthemorrhagic anemia: Secondary | ICD-10-CM | POA: Diagnosis not present

## 2015-08-17 DIAGNOSIS — J449 Chronic obstructive pulmonary disease, unspecified: Secondary | ICD-10-CM | POA: Diagnosis present

## 2015-08-17 DIAGNOSIS — W19XXXA Unspecified fall, initial encounter: Secondary | ICD-10-CM | POA: Diagnosis not present

## 2015-08-17 DIAGNOSIS — J9601 Acute respiratory failure with hypoxia: Secondary | ICD-10-CM | POA: Diagnosis not present

## 2015-08-17 DIAGNOSIS — F411 Generalized anxiety disorder: Secondary | ICD-10-CM | POA: Diagnosis present

## 2015-08-17 DIAGNOSIS — F329 Major depressive disorder, single episode, unspecified: Secondary | ICD-10-CM | POA: Diagnosis present

## 2015-08-17 DIAGNOSIS — Z8249 Family history of ischemic heart disease and other diseases of the circulatory system: Secondary | ICD-10-CM | POA: Diagnosis not present

## 2015-08-17 DIAGNOSIS — R918 Other nonspecific abnormal finding of lung field: Secondary | ICD-10-CM | POA: Diagnosis not present

## 2015-08-17 DIAGNOSIS — I2510 Atherosclerotic heart disease of native coronary artery without angina pectoris: Secondary | ICD-10-CM | POA: Diagnosis present

## 2015-08-17 DIAGNOSIS — Z66 Do not resuscitate: Secondary | ICD-10-CM | POA: Diagnosis present

## 2015-08-17 DIAGNOSIS — M1990 Unspecified osteoarthritis, unspecified site: Secondary | ICD-10-CM | POA: Diagnosis present

## 2015-08-17 DIAGNOSIS — Z885 Allergy status to narcotic agent status: Secondary | ICD-10-CM | POA: Diagnosis not present

## 2015-08-17 DIAGNOSIS — Z809 Family history of malignant neoplasm, unspecified: Secondary | ICD-10-CM | POA: Diagnosis not present

## 2015-08-17 DIAGNOSIS — I252 Old myocardial infarction: Secondary | ICD-10-CM | POA: Diagnosis not present

## 2015-08-17 DIAGNOSIS — I10 Essential (primary) hypertension: Secondary | ICD-10-CM | POA: Diagnosis present

## 2015-08-17 DIAGNOSIS — Z881 Allergy status to other antibiotic agents status: Secondary | ICD-10-CM | POA: Diagnosis not present

## 2015-08-17 DIAGNOSIS — Z87891 Personal history of nicotine dependence: Secondary | ICD-10-CM | POA: Diagnosis not present

## 2015-08-17 DIAGNOSIS — S72009A Fracture of unspecified part of neck of unspecified femur, initial encounter for closed fracture: Secondary | ICD-10-CM | POA: Insufficient documentation

## 2015-08-17 HISTORY — PX: FEMUR IM NAIL: SHX1597

## 2015-08-17 LAB — URINALYSIS, ROUTINE W REFLEX MICROSCOPIC
Bilirubin Urine: NEGATIVE
GLUCOSE, UA: NEGATIVE mg/dL
KETONES UR: NEGATIVE mg/dL
Leukocytes, UA: NEGATIVE
Nitrite: NEGATIVE
Protein, ur: NEGATIVE mg/dL
Specific Gravity, Urine: 1.024 (ref 1.005–1.030)
pH: 6 (ref 5.0–8.0)

## 2015-08-17 LAB — PROTIME-INR
INR: 1.05 (ref 0.00–1.49)
Prothrombin Time: 13.9 seconds (ref 11.6–15.2)

## 2015-08-17 LAB — CBC WITH DIFFERENTIAL/PLATELET
BASOS PCT: 0 %
Basophils Absolute: 0 10*3/uL (ref 0.0–0.1)
Eosinophils Absolute: 0 10*3/uL (ref 0.0–0.7)
Eosinophils Relative: 0 %
HEMATOCRIT: 34.1 % — AB (ref 36.0–46.0)
HEMOGLOBIN: 11.2 g/dL — AB (ref 12.0–15.0)
LYMPHS PCT: 4 %
Lymphs Abs: 0.5 10*3/uL — ABNORMAL LOW (ref 0.7–4.0)
MCH: 30 pg (ref 26.0–34.0)
MCHC: 32.8 g/dL (ref 30.0–36.0)
MCV: 91.4 fL (ref 78.0–100.0)
MONO ABS: 0.4 10*3/uL (ref 0.1–1.0)
MONOS PCT: 2 %
NEUTROS ABS: 14.2 10*3/uL — AB (ref 1.7–7.7)
NEUTROS PCT: 94 %
Platelets: 308 10*3/uL (ref 150–400)
RBC: 3.73 MIL/uL — ABNORMAL LOW (ref 3.87–5.11)
RDW: 15.3 % (ref 11.5–15.5)
WBC: 15.1 10*3/uL — ABNORMAL HIGH (ref 4.0–10.5)

## 2015-08-17 LAB — BASIC METABOLIC PANEL
ANION GAP: 10 (ref 5–15)
BUN: 19 mg/dL (ref 6–20)
CALCIUM: 9.5 mg/dL (ref 8.9–10.3)
CHLORIDE: 109 mmol/L (ref 101–111)
CO2: 26 mmol/L (ref 22–32)
Creatinine, Ser: 0.64 mg/dL (ref 0.44–1.00)
GFR calc non Af Amer: >60 mL/min (ref >60)
GLUCOSE: 171 mg/dL — AB (ref 65–99)
POTASSIUM: 4 mmol/L (ref 3.5–5.1)
Sodium: 145 mmol/L (ref 135–145)

## 2015-08-17 LAB — SURGICAL PCR SCREEN
MRSA, PCR: NEGATIVE
Staphylococcus aureus: NEGATIVE

## 2015-08-17 LAB — URINE MICROSCOPIC-ADD ON

## 2015-08-17 LAB — TROPONIN I: Troponin I: <0.03 ng/mL (ref <0.031)

## 2015-08-17 SURGERY — INSERTION, INTRAMEDULLARY ROD, FEMUR
Anesthesia: General | Site: Hip | Laterality: Right

## 2015-08-17 MED ORDER — POTASSIUM CHLORIDE CRYS ER 10 MEQ PO TBCR
10.0000 meq | EXTENDED_RELEASE_TABLET | Freq: Every day | ORAL | Status: DC
  Administered 2015-08-18 – 2015-08-22 (×5): 10 meq via ORAL
  Filled 2015-08-17 (×5): qty 1

## 2015-08-17 MED ORDER — BUSPIRONE HCL 5 MG PO TABS
7.5000 mg | ORAL_TABLET | Freq: Two times a day (BID) | ORAL | Status: DC
  Administered 2015-08-18 – 2015-08-22 (×9): 7.5 mg via ORAL
  Filled 2015-08-17 (×9): qty 2

## 2015-08-17 MED ORDER — ONDANSETRON HCL 4 MG/2ML IJ SOLN: 4.0000 mg | Freq: Once | INTRAMUSCULAR | Status: DC | PRN

## 2015-08-17 MED ORDER — ROCURONIUM BROMIDE 50 MG/5ML IV SOLN
INTRAVENOUS | Status: AC
  Filled 2015-08-17: qty 1

## 2015-08-17 MED ORDER — HYDROMORPHONE HCL 1 MG/ML IJ SOLN: 0.1000 mg | INTRAMUSCULAR | Status: DC | PRN

## 2015-08-17 MED ORDER — UMECLIDINIUM BROMIDE 62.5 MCG/INH IN AEPB
1.0000 | INHALATION_SPRAY | Freq: Every day | RESPIRATORY_TRACT | Status: DC
  Administered 2015-08-17 – 2015-08-22 (×6): 1 via RESPIRATORY_TRACT
  Filled 2015-08-17: qty 7

## 2015-08-17 MED ORDER — CLINDAMYCIN PHOSPHATE 600 MG/50ML IV SOLN
600.0000 mg | Freq: Four times a day (QID) | INTRAVENOUS | Status: AC
  Administered 2015-08-18 (×2): 600 mg via INTRAVENOUS
  Filled 2015-08-17 (×2): qty 50

## 2015-08-17 MED ORDER — PHENYLEPHRINE HCL 10 MG/ML IJ SOLN
INTRAMUSCULAR | Status: DC | PRN
  Administered 2015-08-17 (×3): 80 ug via INTRAVENOUS

## 2015-08-17 MED ORDER — METOCLOPRAMIDE HCL 5 MG PO TABS: 5.0000 mg | ORAL_TABLET | Freq: Three times a day (TID) | ORAL | Status: DC | PRN

## 2015-08-17 MED ORDER — HYDROMORPHONE HCL 1 MG/ML IJ SOLN
INTRAMUSCULAR | Status: AC
  Filled 2015-08-17: qty 1

## 2015-08-17 MED ORDER — EPHEDRINE SULFATE 50 MG/ML IJ SOLN
INTRAMUSCULAR | Status: DC | PRN
  Administered 2015-08-17: 10 mg via INTRAVENOUS

## 2015-08-17 MED ORDER — GUAIFENESIN ER 600 MG PO TB12
600.0000 mg | ORAL_TABLET | Freq: Two times a day (BID) | ORAL | Status: DC
  Administered 2015-08-18 – 2015-08-22 (×9): 600 mg via ORAL
  Filled 2015-08-17 (×9): qty 1

## 2015-08-17 MED ORDER — FENTANYL CITRATE (PF) 100 MCG/2ML IJ SOLN
50.0000 ug | INTRAMUSCULAR | Status: AC | PRN
  Administered 2015-08-17 (×2): 50 ug via INTRAVENOUS
  Filled 2015-08-17 (×2): qty 2

## 2015-08-17 MED ORDER — ONDANSETRON HCL 4 MG/2ML IJ SOLN: 4.0000 mg | Freq: Four times a day (QID) | INTRAMUSCULAR | Status: DC | PRN

## 2015-08-17 MED ORDER — MENTHOL 3 MG MT LOZG: 1.0000 | LOZENGE | OROMUCOSAL | Status: DC | PRN

## 2015-08-17 MED ORDER — ONDANSETRON HCL 4 MG/2ML IJ SOLN
INTRAMUSCULAR | Status: DC | PRN
  Administered 2015-08-17: 4 mg via INTRAVENOUS

## 2015-08-17 MED ORDER — SUGAMMADEX SODIUM 200 MG/2ML IV SOLN
INTRAVENOUS | Status: DC | PRN
  Administered 2015-08-17: 100 mg via INTRAVENOUS

## 2015-08-17 MED ORDER — HYDROMORPHONE HCL 1 MG/ML IJ SOLN
0.2500 mg | INTRAMUSCULAR | Status: DC | PRN
  Administered 2015-08-17: 0.5 mg via INTRAVENOUS
  Administered 2015-08-17 (×2): 0.25 mg via INTRAVENOUS

## 2015-08-17 MED ORDER — PANTOPRAZOLE SODIUM 40 MG PO TBEC
40.0000 mg | DELAYED_RELEASE_TABLET | Freq: Every day | ORAL | Status: DC
  Administered 2015-08-18 – 2015-08-22 (×5): 40 mg via ORAL
  Filled 2015-08-17 (×6): qty 1

## 2015-08-17 MED ORDER — PHENOL 1.4 % MT LIQD: 1.0000 | OROMUCOSAL | Status: DC | PRN

## 2015-08-17 MED ORDER — ACETAMINOPHEN 325 MG PO TABS
650.0000 mg | ORAL_TABLET | Freq: Four times a day (QID) | ORAL | Status: DC | PRN
  Administered 2015-08-18: 325 mg via ORAL
  Administered 2015-08-20: 650 mg via ORAL
  Filled 2015-08-17 (×2): qty 2

## 2015-08-17 MED ORDER — ACETAMINOPHEN 10 MG/ML IV SOLN
INTRAVENOUS | Status: AC
  Filled 2015-08-17: qty 100

## 2015-08-17 MED ORDER — EPHEDRINE SULFATE 50 MG/ML IJ SOLN
INTRAMUSCULAR | Status: AC
  Filled 2015-08-17: qty 1

## 2015-08-17 MED ORDER — METOCLOPRAMIDE HCL 5 MG/ML IJ SOLN: 5.0000 mg | Freq: Three times a day (TID) | INTRAMUSCULAR | Status: DC | PRN

## 2015-08-17 MED ORDER — LIDOCAINE HCL (CARDIAC) 20 MG/ML IV SOLN
INTRAVENOUS | Status: AC
  Filled 2015-08-17: qty 5

## 2015-08-17 MED ORDER — MORPHINE SULFATE (PF) 2 MG/ML IV SOLN
1.0000 mg | INTRAVENOUS | Status: DC | PRN
  Administered 2015-08-17 (×4): 2 mg via INTRAVENOUS
  Filled 2015-08-17 (×4): qty 1

## 2015-08-17 MED ORDER — LIDOCAINE HCL (PF) 2 % IJ SOLN
INTRAMUSCULAR | Status: DC | PRN
  Administered 2015-08-17: 30 mg via INTRADERMAL

## 2015-08-17 MED ORDER — LIP MEDEX EX OINT
TOPICAL_OINTMENT | CUTANEOUS | Status: AC
  Administered 2015-08-17: 1
  Filled 2015-08-17: qty 7

## 2015-08-17 MED ORDER — ARFORMOTEROL TARTRATE 15 MCG/2ML IN NEBU
15.0000 ug | INHALATION_SOLUTION | Freq: Two times a day (BID) | RESPIRATORY_TRACT | Status: DC
  Administered 2015-08-17 – 2015-08-22 (×9): 15 ug via RESPIRATORY_TRACT
  Filled 2015-08-17 (×13): qty 2

## 2015-08-17 MED ORDER — METHOCARBAMOL 1000 MG/10ML IJ SOLN
500.0000 mg | Freq: Four times a day (QID) | INTRAVENOUS | Status: DC | PRN
  Administered 2015-08-17: 500 mg via INTRAVENOUS
  Filled 2015-08-17 (×2): qty 5

## 2015-08-17 MED ORDER — ALBUTEROL SULFATE (2.5 MG/3ML) 0.083% IN NEBU
2.5000 mg | INHALATION_SOLUTION | RESPIRATORY_TRACT | Status: DC | PRN
  Administered 2015-08-18 – 2015-08-20 (×3): 2.5 mg via RESPIRATORY_TRACT
  Filled 2015-08-17 (×3): qty 3

## 2015-08-17 MED ORDER — FELODIPINE ER 10 MG PO TB24
10.0000 mg | ORAL_TABLET | Freq: Every day | ORAL | Status: DC
  Administered 2015-08-18 – 2015-08-22 (×5): 10 mg via ORAL
  Filled 2015-08-17 (×8): qty 1

## 2015-08-17 MED ORDER — HYDROMORPHONE HCL 1 MG/ML IJ SOLN
1.0000 mg | INTRAMUSCULAR | Status: DC | PRN
  Administered 2015-08-17: 1 mg via INTRAVENOUS
  Filled 2015-08-17: qty 1

## 2015-08-17 MED ORDER — CLINDAMYCIN PHOSPHATE 900 MG/50ML IV SOLN
INTRAVENOUS | Status: DC | PRN
  Administered 2015-08-17: 900 mg via INTRAVENOUS

## 2015-08-17 MED ORDER — PHENYLEPHRINE 40 MCG/ML (10ML) SYRINGE FOR IV PUSH (FOR BLOOD PRESSURE SUPPORT)
PREFILLED_SYRINGE | INTRAVENOUS | Status: AC
  Filled 2015-08-17: qty 10

## 2015-08-17 MED ORDER — ONDANSETRON HCL 4 MG/2ML IJ SOLN
INTRAMUSCULAR | Status: AC
Start: 2015-08-17 — End: 2015-08-17
  Filled 2015-08-17: qty 2

## 2015-08-17 MED ORDER — 0.9 % SODIUM CHLORIDE (POUR BTL) OPTIME
TOPICAL | Status: DC | PRN
  Administered 2015-08-17: 1000 mL

## 2015-08-17 MED ORDER — BUDESONIDE 0.5 MG/2ML IN SUSP
1.0000 mg | Freq: Two times a day (BID) | RESPIRATORY_TRACT | Status: DC
  Administered 2015-08-17 – 2015-08-18 (×3): 1 mg via RESPIRATORY_TRACT
  Filled 2015-08-17 (×3): qty 4

## 2015-08-17 MED ORDER — SODIUM CHLORIDE 0.9 % IV SOLN
INTRAVENOUS | Status: DC
  Administered 2015-08-17: 22:00:00 via INTRAVENOUS

## 2015-08-17 MED ORDER — HYDROMORPHONE HCL 1 MG/ML IJ SOLN: 1.0000 mg | INTRAMUSCULAR | Status: DC | PRN

## 2015-08-17 MED ORDER — OXYCODONE-ACETAMINOPHEN 5-325 MG PO TABS
1.0000 | ORAL_TABLET | Freq: Four times a day (QID) | ORAL | Status: DC | PRN
Start: 2015-08-17 — End: 2015-08-19
  Administered 2015-08-17 – 2015-08-19 (×6): 1 via ORAL
  Filled 2015-08-17 (×7): qty 1

## 2015-08-17 MED ORDER — CITALOPRAM HYDROBROMIDE 20 MG PO TABS
20.0000 mg | ORAL_TABLET | Freq: Every day | ORAL | Status: DC
  Administered 2015-08-18 – 2015-08-22 (×5): 20 mg via ORAL
  Filled 2015-08-17 (×5): qty 1

## 2015-08-17 MED ORDER — ONDANSETRON HCL 4 MG PO TABS: 4.0000 mg | ORAL_TABLET | Freq: Four times a day (QID) | ORAL | Status: DC | PRN

## 2015-08-17 MED ORDER — FENTANYL CITRATE (PF) 100 MCG/2ML IJ SOLN
INTRAMUSCULAR | Status: AC
  Filled 2015-08-17: qty 4

## 2015-08-17 MED ORDER — PROPOFOL 10 MG/ML IV BOLUS
INTRAVENOUS | Status: DC | PRN
  Administered 2015-08-17: 100 mg via INTRAVENOUS

## 2015-08-17 MED ORDER — MORPHINE SULFATE (PF) 2 MG/ML IV SOLN
0.5000 mg | INTRAVENOUS | Status: DC | PRN
  Administered 2015-08-17: 0.5 mg via INTRAVENOUS
  Filled 2015-08-17: qty 1

## 2015-08-17 MED ORDER — DOCUSATE SODIUM 100 MG PO CAPS
100.0000 mg | ORAL_CAPSULE | Freq: Two times a day (BID) | ORAL | Status: DC
  Administered 2015-08-18 – 2015-08-22 (×9): 100 mg via ORAL
  Filled 2015-08-17 (×9): qty 1

## 2015-08-17 MED ORDER — ROCURONIUM BROMIDE 100 MG/10ML IV SOLN
INTRAVENOUS | Status: DC | PRN
  Administered 2015-08-17: 20 mg via INTRAVENOUS

## 2015-08-17 MED ORDER — HYDROXYZINE HCL 25 MG PO TABS: 25.0000 mg | ORAL_TABLET | Freq: Every day | ORAL | Status: DC | PRN

## 2015-08-17 MED ORDER — ENOXAPARIN SODIUM 40 MG/0.4ML ~~LOC~~ SOLN: 40.0000 mg | SUBCUTANEOUS | Status: DC

## 2015-08-17 MED ORDER — CLINDAMYCIN PHOSPHATE 900 MG/50ML IV SOLN
INTRAVENOUS | Status: AC
  Filled 2015-08-17: qty 50

## 2015-08-17 MED ORDER — NITROGLYCERIN 0.4 MG SL SUBL: 0.4000 mg | SUBLINGUAL_TABLET | SUBLINGUAL | Status: DC | PRN

## 2015-08-17 MED ORDER — SUCCINYLCHOLINE CHLORIDE 20 MG/ML IJ SOLN
INTRAMUSCULAR | Status: DC | PRN
  Administered 2015-08-17: 80 mg via INTRAVENOUS

## 2015-08-17 MED ORDER — DEXAMETHASONE SODIUM PHOSPHATE 10 MG/ML IJ SOLN
INTRAMUSCULAR | Status: DC | PRN
  Administered 2015-08-17: 10 mg via INTRAVENOUS

## 2015-08-17 MED ORDER — SUGAMMADEX SODIUM 200 MG/2ML IV SOLN
INTRAVENOUS | Status: AC
  Filled 2015-08-17: qty 2

## 2015-08-17 MED ORDER — LEVOTHYROXINE SODIUM 88 MCG PO TABS
88.0000 ug | ORAL_TABLET | Freq: Every day | ORAL | Status: DC
  Administered 2015-08-17 – 2015-08-22 (×6): 88 ug via ORAL
  Filled 2015-08-17 (×6): qty 1

## 2015-08-17 MED ORDER — ZOLPIDEM TARTRATE 5 MG PO TABS
5.0000 mg | ORAL_TABLET | Freq: Every evening | ORAL | Status: DC | PRN
  Administered 2015-08-20 – 2015-08-21 (×2): 5 mg via ORAL
  Filled 2015-08-17 (×2): qty 1

## 2015-08-17 MED ORDER — PROPOFOL 10 MG/ML IV BOLUS
INTRAVENOUS | Status: AC
  Filled 2015-08-17: qty 20

## 2015-08-17 MED ORDER — TIOTROPIUM BROMIDE MONOHYDRATE 18 MCG IN CAPS
18.0000 ug | ORAL_CAPSULE | Freq: Every day | RESPIRATORY_TRACT | Status: DC
  Administered 2015-08-17 – 2015-08-21 (×5): 18 ug via RESPIRATORY_TRACT
  Filled 2015-08-17 (×2): qty 5

## 2015-08-17 MED ORDER — DEXAMETHASONE SODIUM PHOSPHATE 10 MG/ML IJ SOLN
INTRAMUSCULAR | Status: AC
  Filled 2015-08-17: qty 1

## 2015-08-17 MED ORDER — ACETAMINOPHEN 10 MG/ML IV SOLN
INTRAVENOUS | Status: DC | PRN
  Administered 2015-08-17: 1000 mg via INTRAVENOUS

## 2015-08-17 MED ORDER — FENTANYL CITRATE (PF) 100 MCG/2ML IJ SOLN
INTRAMUSCULAR | Status: DC | PRN
  Administered 2015-08-17 (×3): 50 ug via INTRAVENOUS

## 2015-08-17 MED ORDER — ONDANSETRON HCL 4 MG/2ML IJ SOLN
INTRAMUSCULAR | Status: AC
  Filled 2015-08-17: qty 2

## 2015-08-17 MED ORDER — ENSURE ENLIVE PO LIQD
237.0000 mL | Freq: Three times a day (TID) | ORAL | Status: DC
  Administered 2015-08-18 – 2015-08-21 (×4): 237 mL via ORAL

## 2015-08-17 MED ORDER — ACETAMINOPHEN 650 MG RE SUPP: 650.0000 mg | Freq: Four times a day (QID) | RECTAL | Status: DC | PRN

## 2015-08-17 MED ORDER — LACTATED RINGERS IV SOLN
INTRAVENOUS | Status: DC | PRN
  Administered 2015-08-17: 19:00:00 via INTRAVENOUS

## 2015-08-17 MED ORDER — DOCUSATE SODIUM 100 MG PO CAPS: 100.0000 mg | ORAL_CAPSULE | Freq: Two times a day (BID) | ORAL | Status: DC

## 2015-08-17 MED ORDER — METHOCARBAMOL 500 MG PO TABS
500.0000 mg | ORAL_TABLET | Freq: Four times a day (QID) | ORAL | Status: DC | PRN
  Administered 2015-08-18 – 2015-08-21 (×8): 500 mg via ORAL
  Filled 2015-08-17 (×8): qty 1

## 2015-08-17 MED ORDER — SODIUM CHLORIDE 0.9 % IV SOLN
INTRAVENOUS | Status: DC
  Administered 2015-08-17: 04:00:00 via INTRAVENOUS

## 2015-08-17 SURGICAL SUPPLY — 33 items
BAG SPEC THK2 15X12 ZIP CLS (MISCELLANEOUS)
BAG ZIPLOCK 12X15 (MISCELLANEOUS) ×1 IMPLANT
BNDG GAUZE ELAST 4 BULKY (GAUZE/BANDAGES/DRESSINGS) ×3 IMPLANT
COVER PERINEAL POST (MISCELLANEOUS) ×3 IMPLANT
DRAPE STERI IOBAN 125X83 (DRAPES) ×3 IMPLANT
DRSG MEPILEX BORDER 4X4 (GAUZE/BANDAGES/DRESSINGS) ×4 IMPLANT
DURAPREP 26ML APPLICATOR (WOUND CARE) ×3 IMPLANT
ELECT REM PT RETURN 9FT ADLT (ELECTROSURGICAL) ×3
ELECTRODE REM PT RTRN 9FT ADLT (ELECTROSURGICAL) ×1 IMPLANT
FACESHIELD WRAPAROUND (MASK) ×3 IMPLANT
FACESHIELD WRAPAROUND OR TEAM (MASK) IMPLANT
GAUZE XEROFORM 5X9 LF (GAUZE/BANDAGES/DRESSINGS) ×3 IMPLANT
GLOVE BIO SURGEON STRL SZ7.5 (GLOVE) ×3 IMPLANT
GLOVE BIOGEL PI IND STRL 8 (GLOVE) ×1 IMPLANT
GLOVE BIOGEL PI INDICATOR 8 (GLOVE) ×4
GLOVE ECLIPSE 8.0 STRL XLNG CF (GLOVE) ×3 IMPLANT
GOWN STRL REUS W/TWL XL LVL3 (GOWN DISPOSABLE) ×5 IMPLANT
GUIDE PIN 3.2X343 (PIN) ×1
GUIDE PIN 3.2X343MM (PIN) ×3
NAIL TRIGEN RT 10MMX36CM-125 (Nail) ×2 IMPLANT
NS IRRIG 1000ML POUR BTL (IV SOLUTION) ×3 IMPLANT
PACK GENERAL/GYN (CUSTOM PROCEDURE TRAY) ×3 IMPLANT
PIN GUIDE 3.2X343MM (PIN) IMPLANT
POSITIONER SURGICAL ARM (MISCELLANEOUS) ×3 IMPLANT
SCREW LAG COMPR KIT 95/90 (Screw) ×2 IMPLANT
SPONGE LAP 18X18 X RAY DECT (DISPOSABLE) IMPLANT
STAPLER VISISTAT 35W (STAPLE) ×3 IMPLANT
SUT VIC AB 0 CT1 36 (SUTURE) ×2 IMPLANT
SUT VIC AB 1 CT1 27 (SUTURE) ×3
SUT VIC AB 1 CT1 27XBRD ANTBC (SUTURE) ×2 IMPLANT
SUT VIC AB 2-0 CT1 27 (SUTURE) ×3
SUT VIC AB 2-0 CT1 TAPERPNT 27 (SUTURE) ×1 IMPLANT
TOWEL OR 17X26 10 PK STRL BLUE (TOWEL DISPOSABLE) ×4 IMPLANT

## 2015-08-17 NOTE — Progress Notes (Signed)
Initial Nutrition Assessment  DOCUMENTATION CODES:   Not applicable  INTERVENTION:  -Ensure Enlive po BID, each supplement provides 350 kcal and 20 grams of protein w/ diet advancement -RD continue to monitor  NUTRITION DIAGNOSIS:   Increased nutrient needs related to wound healing as evidenced by per patient/family report.  GOAL:   Patient will meet greater than or equal to 90% of their needs  MONITOR:   PO intake, Labs, Supplement acceptance, Diet advancement  REASON FOR ASSESSMENT:   Malnutrition Screening Tool    ASSESSMENT:   [name] is an 82 year old with a past medical history of COPD, hypothyroidism, admitted to the medicine service on 08/17/2015. She reported being in her usual state health, yesterday evening around 9 PM she was walking her dog. She reached down to pick up the dog's leash when she lost her balance and fell on her right side landing on concrete. She experienced severe right hip pain and was subsequently unable to bear weight or stand up.  Spoke with [name], family at bedside. She endorses a smaller appetite than normal and weight loss, though she is unsure of an amount or time frame. She stated "I'm just not as hungry as I used to be." Explained that this happens as we age and it is not unusual. Per chart review, Pt's weight is down 6#/4.4% over 5 months, which is insignificant for that time frame.  She endorses eating two meals a day, a big breakfast and dinner; PO intake does not seem to be lacking and is likely appropriate for age and weight.  She is currently NPO, will present for surgery this evening.  Nutrition-Focused physical exam completed. Findings are mild depletion, mild muscle depletion, and no edema.    Labs: Phos 4.7 Medications: KCL PO;   Diet Order:  Diet NPO time specified Except for: Sips with Meds  Skin:  Reviewed, no issues  Last BM:  5/9  Height:   Ht Readings from Last 1 Encounters:  08/17/15 '5\' 3"'$  (1.6 m)     Weight:   Wt Readings from Last 1 Encounters:  08/17/15 109 lb 5.6 oz (49.6 kg)    Ideal Body Weight:  52.27 kg  BMI:  Body mass index is 19.38 kg/(m^2).  Estimated Nutritional Needs:   Kcal:  1250-1500  Protein:  50-65 grams  Fluid:  >/= 1.25L  EDUCATION NEEDS:   No education needs identified at this time  Satira Anis. Spyridon Hornstein, Kaitlin, RD LDN After Hours/Weekend Pager [phone]

## 2015-08-17 NOTE — Anesthesia Preprocedure Evaluation (Addendum)
Anesthesia Evaluation  Patient identified by MRN, date of birth, ID band Patient awake    Reviewed: Allergy & Precautions, H&P , NPO status , Patient's Chart, lab work & pertinent test results  History of Anesthesia Complications Negative for: history of anesthetic complications  Airway Mallampati: I  TM Distance: >3 FB Neck ROM: full    Dental  (+) Poor Dentition   Pulmonary COPD,  oxygen dependent, former smoker,    + rhonchi        Cardiovascular hypertension, + CAD, + Past MI and + Peripheral Vascular Disease  Normal cardiovascular exam Rhythm:regular Rate:Normal  2015 Echo with normal EF, no sig valve abnormalities   Neuro/Psych Anxiety Slight motor/sensory residual deficits in hands, no major deficits from stroke CVA    GI/Hepatic negative GI ROS, Neg liver ROS,   Endo/Other  Hypothyroidism   Renal/GU negative Renal ROS     Musculoskeletal  (+) Arthritis ,   Abdominal   Peds  Hematology  (+) anemia ,   Anesthesia Other Findings On 2L O2 at home, not on O2 when brought from the floor bed, currently stable and at baseline, has CAD and PVD and takes plavix, denies chest pain, dyspnea with exertion or signs of heart failure  Reproductive/Obstetrics negative OB ROS                           Anesthesia Physical Anesthesia Plan  ASA: III  Anesthesia Plan: General   Post-op Pain Management:    Induction: Intravenous  Airway Management Planned: Oral ETT  Additional Equipment:   Intra-op Plan:   Post-operative Plan: Extubation in OR  Informed Consent: I have reviewed the patients History and Physical, chart, labs and discussed the procedure including the risks, benefits and alternatives for the proposed anesthesia with the patient or authorized representative who has indicated his/her understanding and acceptance.   Dental Advisory Given  Plan Discussed with: Anesthesiologist,  CRNA and Surgeon  Anesthesia Plan Comments: (Have to avoid neuraxial due to plavix, will have CAD goal of HR < 80, BP baseline for perfusion pressure, optimize vent strategy given lung history and optimize analgesia with multimodal therapy and hydromorphone)       Anesthesia Quick Evaluation

## 2015-08-17 NOTE — Progress Notes (Signed)
PROGRESS NOTE    [name]  WUJ:[id number] DOB: 1931-01-25 DOA: 08/16/2015 PCP: Junie Panning, NP Outpatient Specialists:  Brief Narrative:  [name] is an 82 year old with a past medical history of COPD, hypothyroidism, admitted to the medicine service on 08/17/2015. She reported being in her usual state health, yesterday evening around 9 PM she was walking her dog. She reached down to pick up the dog's leash when she lost her balance and fell on her right side landing on concrete. She experienced severe right hip pain and was subsequently unable to bear weight or stand up. She called for help and a neighbor responded calling EMS. She denies symptoms prior to her fall.    Assessment & Plan:   Principal Problem:   Hip fracture, right (HCC) Active Problems:   Hypothyroidism   COPD (chronic obstructive pulmonary disease) ([place])   Fall   Anemia   Insomnia   1.  Right hip fracture. -[name] is an 82 year old female who suffered a fall yesterday evening landing on concrete. She had leaned over to pick up her dog's leash reporting losing her balance. Denies symptoms prior to event. -Imaging studies performed on admission revealed a right hip intertrochanteric fracture. -Requested consultation from [place] -She does not appear to have acute medical issues, labs reviewed, chest x-ray clear, EKG unremarkable. Low to moderate surgical risk given advanced age and comorbidities.  2.  Leukocytosis. -Labs show a white count of 15,100 -Suspect related to hip fracture.  3.  Hypertension -Blood pressure stable, continue felodipine 10 mg by mouth daily  4.  Pain management. -Continue Percocet 1 tablet every 6 hours as needed with IV morphine for severe breakthrough pain.   DVT prophylaxis: SCDs Code Status: DO NOT RESUSCITATE Family Communication: Disposition Plan: [place] [facility] consulted   Consultants:   Dr.  Lorin Mercy   Procedures:   Subjective: Patient reporting pain to her right hip, particular with any movement  Objective: Filed Vitals:   08/17/15 0344 08/17/15 0354 08/17/15 0642 08/17/15 0905  BP:   144/62   Pulse:   98   Temp:   98.6 F (37 C)   TempSrc:   Oral   Resp:   16   Height: '5\' 3"'$  (1.6 m) '5\' 3"'$  (1.6 m) '5\' 3"'$  (1.6 m)   Weight:   49.6 kg (109 lb 5.6 oz)   SpO2:   97% 95%    Intake/Output Summary (Last 24 hours) at 08/17/15 1014 Last data filed at 08/17/15 0700  Gross per 24 hour  Intake 148.75 ml  Output      0 ml  Net 148.75 ml   Filed Weights   08/17/15 0642  Weight: 49.6 kg (109 lb 5.6 oz)    Examination:  General exam: Appears calm and comfortable  Respiratory system: Clear to auscultation. Respiratory effort normal. Cardiovascular system: S1 & S2 heard, RRR. No JVD, murmurs, rubs, gallops or clicks. No pedal edema. Gastrointestinal system: Abdomen is nondistended, soft and nontender. No organomegaly or masses felt. Normal bowel sounds heard. Central nervous system: Alert and oriented. No focal neurological deficits. Extremities: Right lower extremity is externally rotated and shorter compared to left lower extremity Skin: No rashes, lesions or ulcers Psychiatry: Judgement and insight appear normal. Mood & affect appropriate.     Data Reviewed: I have personally reviewed following labs and imaging studies  CBC:  Recent Labs Lab 08/17/15 0109  WBC 15.1*  NEUTROABS 14.2*  HGB 11.2*  HCT 34.1*  MCV 91.4  PLT 161   Basic Metabolic Panel:  Recent Labs Lab 08/17/15 0109  NA 145  K 4.0  CL 109  CO2 26  GLUCOSE 171*  BUN 19  CREATININE 0.64  CALCIUM 9.5   GFR: Estimated Creatinine Clearance: 41.7 mL/min (by C-G formula based on Cr of 0.64). Liver Function Tests: No results for input(s): AST, ALT, ALKPHOS, BILITOT, PROT, ALBUMIN in the last 168 hours. No results for input(s): LIPASE, AMYLASE in the last 168 hours. No results for  input(s): AMMONIA in the last 168 hours. Coagulation Profile:  Recent Labs Lab 08/17/15 0109  INR 1.05   Cardiac Enzymes:  Recent Labs Lab 08/17/15 0404  TROPONINI <0.03   BNP (last 3 results) No results for input(s): PROBNP in the last 8760 hours. HbA1C: No results for input(s): HGBA1C in the last 72 hours. CBG: No results for input(s): GLUCAP in the last 168 hours. Lipid Profile: No results for input(s): CHOL, HDL, LDLCALC, TRIG, CHOLHDL, LDLDIRECT in the last 72 hours. Thyroid Function Tests: No results for input(s): TSH, T4TOTAL, FREET4, T3FREE, THYROIDAB in the last 72 hours. Anemia Panel: No results for input(s): VITAMINB12, FOLATE, FERRITIN, TIBC, IRON, RETICCTPCT in the last 72 hours. Urine analysis:    Component Value Date/Time   COLORURINE YELLOW 05/29/2015 2142   APPEARANCEUR CLEAR 05/29/2015 2142   LABSPEC 1.018 05/29/2015 2142   PHURINE 6.0 05/29/2015 2142   GLUCOSEU NEGATIVE 05/29/2015 2142   HGBUR LARGE* 05/29/2015 2142   BILIRUBINUR NEGATIVE 05/29/2015 2142   [place] NEGATIVE 05/29/2015 2142   PROTEINUR NEGATIVE 05/29/2015 2142   UROBILINOGEN 0.2 11/01/2013 1731   NITRITE NEGATIVE 05/29/2015 2142   LEUKOCYTESUR SMALL* 05/29/2015 2142   Sepsis Labs: No results for input(s): PROCALCITON, LATICACIDVEN in the last 168 hours.  No results found for this or any previous visit (from the past 240 hour(s)).       Radiology Studies: Dg Chest 2 View  08/17/2015  CLINICAL DATA:  Dyspnea.  Hip fracture. EXAM: CHEST  2 VIEW COMPARISON:  None. FINDINGS: A single supine AP view of the chest is negative for pneumothorax or large effusion. Mediastinal contours are normal. The lungs are clear. The pulmonary vasculature is normal. IMPRESSION: No acute cardiopulmonary findings. Electronically Signed   By: Andreas Newport M.D.   On: 08/17/2015 01:17   Ct Head Wo Contrast  08/17/2015  CLINICAL DATA:  [place] 3 hours ago. EXAM: CT HEAD WITHOUT CONTRAST TECHNIQUE:  Contiguous axial images were obtained from the base of the skull through the vertex without intravenous contrast. COMPARISON:  11/03/2013 FINDINGS: There is no intracranial hemorrhage, mass or evidence of acute infarction. There is moderate generalized atrophy. There is moderate chronic microvascular ischemic change. There is remote lacunar infarction in the right caudate and right putamen. There is no significant extra-axial fluid collection. No acute intracranial findings are evident. The calvarium and skullbase are intact. The visible paranasal sinuses and orbits are unremarkable. IMPRESSION: No acute intracranial findings. There is moderate generalized atrophy, remote lacunar infarctions, and chronic appearing white matter hypodensities which likely represent small vessel ischemic disease. Electronically Signed   By: Andreas Newport M.D.   On: 08/17/2015 00:52   Dg Hip Unilat  With Pelvis 2-3 Views Right  08/16/2015  CLINICAL DATA:  [place] while walking her dog at 20:00. EXAM: DG HIP (WITH OR WITHOUT PELVIS) 2-3V RIGHT COMPARISON:  None. FINDINGS: There is an intertrochanteric right hip fracture, well aligned. No dislocation. Moderate right hip arthritis. Left hip is moderately arthritic as well. IMPRESSION:  Intertrochanteric right hip fracture. Electronically Signed   By: Andreas Newport M.D.   On: 08/16/2015 23:59        Scheduled Meds: . arformoterol  15 mcg Nebulization BID  . budesonide (PULMICORT) nebulizer solution  1 mg Nebulization BID  . busPIRone  7.5 mg Oral BID  . citalopram  20 mg Oral Daily  . docusate sodium  100 mg Oral BID  . enoxaparin (LOVENOX) injection  40 mg Subcutaneous Q24H  . feeding supplement (ENSURE ENLIVE)  237 mL Oral TID BM  . felodipine  10 mg Oral Daily  . guaiFENesin  600 mg Oral BID  . levothyroxine  88 mcg Oral Daily  . lip balm      . pantoprazole  40 mg Oral Daily  . potassium chloride  10 mEq Oral Daily  . tiotropium  18 mcg Inhalation Daily  .  umeclidinium bromide  1 puff Inhalation Daily   Continuous Infusions: . sodium chloride 75 mL/hr at 08/17/15 0401     LOS: 0 days    Time spent: 30 minutes    Kelvin Cellar, MD [facility] Pager [phone]   If 7PM-7AM, please contact night-coverage [email or web address] Password [email or web address] 08/17/2015, 10:14 AM

## 2015-08-17 NOTE — Anesthesia Procedure Notes (Signed)
Procedure Name: Intubation Date/Time: 08/17/2015 6:31 PM Performed by: Lajuana Carry E Pre-anesthesia Checklist: Patient identified, Emergency Drugs available, Suction available and Patient being monitored Patient Re-evaluated:Patient Re-evaluated prior to inductionOxygen Delivery Method: Circle System Utilized Preoxygenation: Pre-oxygenation with 100% oxygen Intubation Type: IV induction Ventilation: Mask ventilation without difficulty Laryngoscope Size: Miller and 2 Grade View: Grade I Tube type: Oral Tube size: 7.0 mm Number of attempts: 1 Airway Equipment and Method: Stylet Placement Confirmation: ETT inserted through vocal cords under direct vision,  positive ETCO2 and breath sounds checked- equal and bilateral Secured at: 21 cm Tube secured with: Tape Dental Injury: Teeth and Oropharynx as per pre-operative assessment

## 2015-08-17 NOTE — Transfer of Care (Signed)
Immediate Anesthesia Transfer of Care Note  Patient: [name]  Procedure(s) Performed: Procedure(s): RIGHT HIP INTRAMEDULLARY (IM) NAIL FEMORAL (Right)  Patient Location: PACU  Anesthesia Type:General  Level of Consciousness:  Initially confused and slightly combative, patient cooperative and responds to stimulation in PACU  Airway & Oxygen Therapy:Patient Spontanous Breathing and Patient connected to face mask oxgen  Post-op Assessment:  Report given to PACU RN and Post -op Vital signs reviewed and stable  Post vital signs:  Reviewed and stable  Last Vitals:  Filed Vitals:   08/17/15 0642 08/17/15 1357  BP: 144/62 151/66  Pulse: 98 92  Temp: 37 C 36.9 C  Resp: 16 16    Complications: No apparent anesthesia complications

## 2015-08-17 NOTE — H&P (Addendum)
History and Physical    [name]:[id number] DOB: 1930-07-26 DOA: 08/16/2015  Referring MD/NP/PA: Dr. Dina Rich PCP: Smothers, Kaitlin Elk, NP  Outpatient Specialists: Dr. Orrin [name], pulmonologist Patient coming from: Home  Chief Complaint: Fall  HPI: [name] is a 82 y.o. female with medical history significant of COPD, oxygen dependent on 2 L, anxiety, anemia, CAD, PVD, HTN, and carotid artery occlusion; who presents after falling on the pavement. She states that last night sometime around 8 or 9 PM she went out into her yard to bring her dog inside. She remembers that he had gotten tangled and ablation she bent down to grab his leash and fell on her right side hitting her head on the pavement. She notes inability to stand up following fall and complained of a throbbing pain on her right hip. Pain is a 9/10. Associated symptoms include some tingling sensation down the right leg. She reports some baseline shortness of breath for which she is on 2 L of nasal cannula oxygen. With the recent seasonal changes she's had some increased congestion. Denies having any urinary frequency, chest pain, fever, chills, change in weight, or diarrhea. She initially feels that she would be outside all night but luckily her next or neighbors came home found her and picked her up and brought her in to her home before calling EMS. Patient live alone and was fairly independent. Denies ever having a broken bone before. Currently is on Plavix.  ED Course: Upon arrival to the emergency department patient was seen to be afebrile with heart rates up to 104, respiration rate up to 37, blood pressure stable, and O2 saturations within normal limits on 2 L nasal Oxygen. Lab work revealed WBC of 15.1, hemoglobin 11.2, glucose of 171. CT the patient no acute abnormalities, checks x-ray was unremarkable, and x-ray of the right hip revealed acute intertrochanteric hip fracture.  Review of Systems: As per HPI otherwise  10 point review of systems negative.    Past Medical History  Diagnosis Date  . COPD (chronic obstructive pulmonary disease) ([place])   . Carotid artery occlusion   . Arthritis   . Anxiety     Afraid , living alone  . Myocardial infarction ([place]) 1987  . Anemia   . Bilateral leg pain     for years  . Femoral bruit   . Bronchitis, chronic ([place])   . Fall April 2014    Pt. has fallen twife in the last 2 months.   . Personal history of other diseases of digestive system     Upper GI bleed  . Generalized anxiety disorder   . Nonspecific abnormal electrocardiogram (ECG) (EKG)     Abnormal Results EKG  . Memory loss     Worsening  . Intestinal infection due to Clostridium difficile   . Colitis due to Clostridium difficile   . Hypertension   . CAD (coronary artery disease)   . Thyroid disease     Hypo-Thyroidism    Past Surgical History  Procedure Laterality Date  . Carotid endarterectomy  12/30/2008  . Abdominal aortagram N/A 09/19/2012    Procedure: ABDOMINAL Maxcine Ham;  Surgeon: Elam Dutch, MD;  Location: [facility] CATH LAB;  Service: Cardiovascular;  Laterality: N/A;  . Lower extremity angiogram Bilateral 09/19/2012    Procedure: LOWER EXTREMITY ANGIOGRAM;  Surgeon: Elam Dutch, MD;  Location: [facility] CATH LAB;  Service: Cardiovascular;  Laterality: Bilateral;     reports that she quit smoking about 31 years ago.  Her smoking use included Cigarettes. She quit after 15 years of use. She has never used smokeless tobacco. She reports that she does not drink alcohol or use illicit drugs.  Allergies  Allergen Reactions  . Aspirin     GI  Bleed  . Penicillins     Numbness around mouth  Has patient had a PCN reaction causing immediate rash, facial/tongue/throat swelling, SOB or lightheadedness with hypotension: Yes Has patient had a PCN reaction causing severe rash involving mucus membranes or skin necrosis: No Has patient had a PCN reaction that required hospitalization No Has  patient had a PCN reaction occurring within the last 10 years: No If all of the above answers are "NO", then may proceed with Cephalosporin use.    . Biaxin [Clarithromycin] Nausea Only  . Sulfonamide Derivatives Nausea And Vomiting  . Buprenorphine Nausea And Vomiting  . Codeine Nausea And Vomiting  . Hydrocodone Nausea And Vomiting  . Morphine And Related Nausea Only    Nausea   . Sulfa Antibiotics Nausea And Vomiting  . Xanax [Alprazolam]     unknown    Family History  Problem Relation Age of Onset  . Cancer Mother   . Deep vein thrombosis Mother   . Cancer Father   . Kidney disease Father   . Heart disease Father     Prior to Admission medications   Medication Sig Start Date End Date Taking? Authorizing Provider  budesonide-formoterol (SYMBICORT) 160-4.5 MCG/ACT inhaler Inhale 2 puffs into the lungs 2 (two) times daily as needed (for shortness of breath).    Yes Historical Provider, MD  busPIRone (BUSPAR) 7.5 MG tablet Take 7.5 mg by mouth 2 (two) times daily. 06/10/15  Yes Historical Provider, MD  citalopram (CELEXA) 20 MG tablet Take 20 mg by mouth daily.   Yes Historical Provider, MD  clopidogrel (PLAVIX) 75 MG tablet Take 75 mg by mouth daily. 08/12/15  Yes Historical Provider, MD  [employer] SENNA PLUS 8.6-50 MG tablet Take 1 tablet by mouth 2 (two) times daily. 06/10/15  Yes Historical Provider, MD  feeding supplement, ENSURE ENLIVE, (ENSURE ENLIVE) LIQD Take 237 mLs by mouth 3 (three) times daily between meals. 05/31/15  Yes Annita Brod, MD  felodipine (PLENDIL) 5 MG 24 hr tablet Take 10 mg by mouth daily.    Yes Historical Provider, MD  hydrOXYzine (ATARAX/VISTARIL) 25 MG tablet Take 25-50 mg by mouth daily as needed (sleep).  05/28/15  Yes Historical Provider, MD  INCRUSE ELLIPTA 62.5 MCG/INH AEPB Inhale 1 puff into the lungs daily. 08/12/15  Yes Historical Provider, MD  ipratropium-albuterol (DUONEB) 0.5-2.5 (3) MG/3ML SOLN Take 3 mLs by nebulization every 6 (six) hours as  needed (shortness of breath).  03/10/15  Yes Historical Provider, MD  levothyroxine (SYNTHROID, LEVOTHROID) 88 MCG tablet Take 88 mcg by mouth daily. 08/12/15  Yes Historical Provider, MD  nitroGLYCERIN (NITROSTAT) 0.4 MG SL tablet Place 0.4 mg under the tongue every 5 (five) minutes as needed for chest pain.   Yes Historical Provider, MD  omeprazole (PRILOSEC) 20 MG capsule Take 1 capsule (20 mg total) by mouth daily. Patient taking differently: Take 20 mg by mouth daily as needed (acid reflux).  05/31/15  Yes Annita Brod, MD  oxyCODONE-acetaminophen (PERCOCET/ROXICET) 5-325 MG tablet Take 1 tablet by mouth daily as needed. 08/04/15  Yes Historical Provider, MD  potassium chloride (MICRO-K) 10 MEQ CR capsule Take 10 mEq by mouth daily.   Yes Historical Provider, MD  tiotropium (SPIRIVA) 18 MCG inhalation  capsule Place 1 capsule (18 mcg total) into inhaler and inhale daily. 11/04/13  Yes Eugenie Filler, MD  zolpidem (AMBIEN) 5 MG tablet Take 1 tablet (5 mg total) by mouth at bedtime as needed for sleep. 05/31/15  Yes Annita Brod, MD  levothyroxine (SYNTHROID, LEVOTHROID) 100 MCG tablet TAKE 1 TABLET BY MOUTH EVERY MORNING BEFORE BREAKFAST Patient not taking: Reported on 08/17/2015 03/31/14   Estill Dooms, MD    Physical Exam: Filed Vitals:   08/16/15 2330 08/17/15 0120 08/17/15 0121 08/17/15 0130  BP: 149/75 140/73  156/69  Pulse: 101  98 96  Temp:      Resp:   18 15  SpO2: 94%  96% 95%      Constitutional: Elderly female in moderate distress unable to get comfortable in hospital bed Filed Vitals:   08/16/15 2330 08/17/15 0120 08/17/15 0121 08/17/15 0130  BP: 149/75 140/73  156/69  Pulse: 101  98 96  Temp:      Resp:   18 15  SpO2: 94%  96% 95%   Eyes: PERRL, lids and conjunctivae normal ENMT: Mucous membranes are moist. Posterior pharynx clear of any exudate or lesions.Normal dentition.  Neck: normal, supple, no masses, no thyromegaly Respiratory: Mild bilateral  expiratory wheezes. Normal respiratory effort. No accessory muscle use.  Cardiovascular: Regular rate and rhythm, no murmurs / rubs / gallops. No extremity edema. 2+ pedal pulses. No carotid bruits.  Abdomen: no tenderness, no masses palpated. No hepatosplenomegaly. Bowel sounds positive.  Musculoskeletal: no clubbing / cyanosis.Right hip and just slightly externally rotate. Significant pain with any movement of the affected extremity. Skin: no rashes, lesions, ulcers. No induration Neurologic: CN 2-12 grossly intact. Sensation intact, DTR normal. Strength 5/5 in all 4.  Psychiatric: Normal judgment and insight. Alert and oriented x 3. Normal mood.     Labs on Admission: I have personally reviewed following labs and imaging studies  CBC:  Recent Labs Lab 08/17/15 0109  WBC 15.1*  NEUTROABS 14.2*  HGB 11.2*  HCT 34.1*  MCV 91.4  PLT 017   Basic Metabolic Panel:  Recent Labs Lab 08/17/15 0109  NA 145  K 4.0  CL 109  CO2 26  GLUCOSE 171*  BUN 19  CREATININE 0.64  CALCIUM 9.5   GFR: CrCl cannot be calculated (Unknown ideal weight.). Liver Function Tests: No results for input(s): AST, ALT, ALKPHOS, BILITOT, PROT, ALBUMIN in the last 168 hours. No results for input(s): LIPASE, AMYLASE in the last 168 hours. No results for input(s): AMMONIA in the last 168 hours. Coagulation Profile:  Recent Labs Lab 08/17/15 0109  INR 1.05   Cardiac Enzymes: No results for input(s): CKTOTAL, CKMB, CKMBINDEX, TROPONINI in the last 168 hours. BNP (last 3 results) No results for input(s): PROBNP in the last 8760 hours. HbA1C: No results for input(s): HGBA1C in the last 72 hours. CBG: No results for input(s): GLUCAP in the last 168 hours. Lipid Profile: No results for input(s): CHOL, HDL, LDLCALC, TRIG, CHOLHDL, LDLDIRECT in the last 72 hours. Thyroid Function Tests: No results for input(s): TSH, T4TOTAL, FREET4, T3FREE, THYROIDAB in the last 72 hours. Anemia Panel: No results  for input(s): VITAMINB12, FOLATE, FERRITIN, TIBC, IRON, RETICCTPCT in the last 72 hours. Urine analysis:    Component Value Date/Time   COLORURINE YELLOW 05/29/2015 2142   APPEARANCEUR CLEAR 05/29/2015 2142   LABSPEC 1.018 05/29/2015 2142   PHURINE 6.0 05/29/2015 2142   GLUCOSEU NEGATIVE 05/29/2015 2142   HGBUR LARGE* 05/29/2015 2142  BILIRUBINUR NEGATIVE 05/29/2015 2142   KETONESUR NEGATIVE 05/29/2015 2142   PROTEINUR NEGATIVE 05/29/2015 2142   UROBILINOGEN 0.2 11/01/2013 1731   NITRITE NEGATIVE 05/29/2015 2142   LEUKOCYTESUR SMALL* 05/29/2015 2142   Sepsis Labs: '@LABRCNTIP'$ (procalcitonin:4,lacticidven:4) )No results found for this or any previous visit (from the past 240 hour(s)).   Radiological Exams on Admission: Dg Chest 2 View  08/17/2015  CLINICAL DATA:  Dyspnea.  Hip fracture. EXAM: CHEST  2 VIEW COMPARISON:  None. FINDINGS: A single supine AP view of the chest is negative for pneumothorax or large effusion. Mediastinal contours are normal. The lungs are clear. The pulmonary vasculature is normal. IMPRESSION: No acute cardiopulmonary findings. Electronically Signed   By: Andreas Newport M.D.   On: 08/17/2015 01:17   Ct Head Wo Contrast  08/17/2015  CLINICAL DATA:  [place] 3 hours ago. EXAM: CT HEAD WITHOUT CONTRAST TECHNIQUE: Contiguous axial images were obtained from the base of the skull through the vertex without intravenous contrast. COMPARISON:  11/03/2013 FINDINGS: There is no intracranial hemorrhage, mass or evidence of acute infarction. There is moderate generalized atrophy. There is moderate chronic microvascular ischemic change. There is remote lacunar infarction in the right caudate and right putamen. There is no significant extra-axial fluid collection. No acute intracranial findings are evident. The calvarium and skullbase are intact. The visible paranasal sinuses and orbits are unremarkable. IMPRESSION: No acute intracranial findings. There is moderate generalized  atrophy, remote lacunar infarctions, and chronic appearing white matter hypodensities which likely represent small vessel ischemic disease. Electronically Signed   By: Andreas Newport M.D.   On: 08/17/2015 00:52   Dg Hip Unilat  With Pelvis 2-3 Views Right  08/16/2015  CLINICAL DATA:  [place] while walking her dog at 20:00. EXAM: DG HIP (WITH OR WITHOUT PELVIS) 2-3V RIGHT COMPARISON:  None. FINDINGS: There is an intertrochanteric right hip fracture, well aligned. No dislocation. Moderate right hip arthritis. Left hip is moderately arthritic as well. IMPRESSION: Intertrochanteric right hip fracture. Electronically Signed   By: Andreas Newport M.D.   On: 08/16/2015 23:59    EKG: Independently reviewed. Sinus tachycardia with low voltage in the precordial leads  Assessment/Plan Fall with right intertrochanteric hip fracture: Acute. Patient acutely fell after untingling in her dog from a bush. She does not recall losing consciousness,but was unable to ambulate thereafter. X-rays of the hip show a right intertrochanteric fracture. CT of the brain showed no acute abnormalities.  - Admit to MedSurg bed - Hip fracture order set initiated - Check troponin 1 -  Normal saline IV fluids at 156m/hr  - Percocet/morphine prn for moderate to severe pain respectively - Dr. YLorin Mercy[facility] to see in a.m. - Consult to case management and social work  Leukocytosis: WBC elevated at 15.1 question if this is reactive. - Follow-up urinalysis  COPD without acute exacerbation and oxygen dependent at 2 L nasal cannula oxygen - 2 L of nasal cannula oxygen continuously - Question double anticholinergics Spiriva & Encruse  - Albuterol nebs q 4hrs prn sob or wheezing  - Changed Symbicort to nebulized treatments of budesonide and Brovana twice daily - Mucinex - May want to consult pulmonologist in a.m.  Anemia: Chronic. Hemoglobin close to baseline which ranges from 10-11. - Continue to monitor  - Check CBC  tomorrow morning  Essential hypertension - continue felodipine  Coronary artery disease and peripheral vascular disease - held Plavix, restart when able  Hypothyroidism - Continue levothyroxine  Anxiety/depression - Continue Celexa and buspirone  Insomnia -  Continue Ambien/hydroxyzine prn  GERD - Pharmacy substitution of Protonix    DVT prophylaxis: lovenox Code Status: DNR Family Communication: None Disposition Plan: Undetermined  Consults called: Dr. Lorin Mercy consulted by ED physician Admission status: Inpatient medical   Norval Morton MD [facility] Pager [phone]  If 7PM-7AM, please contact night-coverage [email or web address] Password [email or web address]  08/17/2015, 2:28 AM

## 2015-08-17 NOTE — Progress Notes (Signed)
CSW consulted for SNF placement. PN reviewed. Pt admitted with right hip fx. Ortho consult pending. CSW met with pt at bedside to offer emotional support. Pt reports that she has been to [facility] multiple times. At pt's request, SNF contacted and updated on pt's present status. CSW will continue to follow and assist with d/c planning as needed.  Werner Lean LCSW [phone]

## 2015-08-17 NOTE — Progress Notes (Signed)
Patient ID: [name], female   DOB: 01-Aug-1930, 82 y.o.   MRN: [id number] IT hip fracture right to be fixed this evening by Dr. Zollie Beckers.  NPO already , consent ordered.

## 2015-08-17 NOTE — Progress Notes (Signed)
Patient ID: [name], female   DOB: Jun 02, 1930, 82 y.o.   MRN: [id number] [name] sustained a right hip intertrochanteric fracture following a mechanical fall.  She fully understands the recommendation for surgery and the risks and benefits involved.  Will proceed to surgery this evening.

## 2015-08-17 NOTE — Anesthesia Postprocedure Evaluation (Signed)
Anesthesia Post Note  Patient: [name]  Procedure(s) Performed: Procedure(s) (LRB): RIGHT HIP INTRAMEDULLARY (IM) NAIL FEMORAL (Right)  Patient location during evaluation: PACU Anesthesia Type: General Level of consciousness: awake and alert Pain management: pain level controlled Vital Signs Assessment: post-procedure vital signs reviewed and stable Respiratory status: spontaneous breathing, nonlabored ventilation, respiratory function stable and patient connected to nasal cannula oxygen Cardiovascular status: blood pressure returned to baseline and stable Postop Assessment: no signs of nausea or vomiting Anesthetic complications: no Comments: Awake and alert, responds appropriately to commands, moves all four extremities    Last Vitals:  Filed Vitals:   08/17/15 1357 08/17/15 1948  BP: 151/66 131/65  Pulse: 92 107  Temp: 36.9 C 36.5 C  Resp: 16 14    Last Pain:  Filed Vitals:   08/17/15 1955  PainSc: 2                  Zenaida Deed

## 2015-08-17 NOTE — Brief Op Note (Signed)
08/16/2015 - 08/17/2015  7:23 PM  PATIENT:  [name]  82 y.o. female  PRE-OPERATIVE DIAGNOSIS:  intertrochanteric fracture right hip  POST-OPERATIVE DIAGNOSIS:  intertrochanteric fracture right hip  PROCEDURE:  Procedure(s): RIGHT HIP INTRAMEDULLARY (IM) NAIL FEMORAL (Right)  SURGEON:  Surgeon(s) and Role:    * Mcarthur Rossetti, MD - Primary  PHYSICIAN ASSISTANT: Benita Stabile, PA-C  ANESTHESIA:   general  EBL:   100 cc  COUNTS:  YES  DICTATION: .Other Dictation: Dictation Number [id number]  PLAN OF CARE: Admit to inpatient   PATIENT DISPOSITION:  PACU - hemodynamically stable.   Delay start of Pharmacological VTE agent (>24hrs) due to surgical blood loss or risk of bleeding: no

## 2015-08-18 ENCOUNTER — Encounter (HOSPITAL_COMMUNITY): Payer: Self-pay | Admitting: Orthopaedic Surgery

## 2015-08-18 LAB — CBC
HCT: 27.1 % — ABNORMAL LOW (ref 36.0–46.0)
Hemoglobin: 8.8 g/dL — ABNORMAL LOW (ref 12.0–15.0)
MCH: 30.1 pg (ref 26.0–34.0)
MCHC: 32.5 g/dL (ref 30.0–36.0)
MCV: 92.8 fL (ref 78.0–100.0)
PLATELETS: 223 10*3/uL (ref 150–400)
RBC: 2.92 MIL/uL — AB (ref 3.87–5.11)
RDW: 15 % (ref 11.5–15.5)
WBC: 10.3 10*3/uL (ref 4.0–10.5)

## 2015-08-18 LAB — BASIC METABOLIC PANEL
Anion gap: 11 (ref 5–15)
BUN: 18 mg/dL (ref 6–20)
CALCIUM: 8.4 mg/dL — AB (ref 8.9–10.3)
CO2: 24 mmol/L (ref 22–32)
CREATININE: 0.79 mg/dL (ref 0.44–1.00)
Chloride: 107 mmol/L (ref 101–111)
GFR calc Af Amer: >60 mL/min (ref >60)
GLUCOSE: 159 mg/dL — AB (ref 65–99)
POTASSIUM: 4 mmol/L (ref 3.5–5.1)
SODIUM: 142 mmol/L (ref 135–145)

## 2015-08-18 MED ORDER — OXYCODONE-ACETAMINOPHEN 5-325 MG PO TABS: 1.0000 | ORAL_TABLET | Freq: Every day | ORAL | Status: DC | PRN

## 2015-08-18 NOTE — Op Note (Signed)
[name], [name]              ACCOUNT NO.:  [id number]  MEDICAL RECORD NO.:  [id number]  LOCATION:  4680                         FACILITY:  [facility]  PHYSICIAN:  [name], M.D.DATE OF BIRTH:  1931/03/22  DATE OF PROCEDURE:  08/17/2015 DATE OF DISCHARGE:                              OPERATIVE REPORT   PREOPERATIVE DIAGNOSIS:  Right hip displaced closed intertrochanteric femur fracture.  POSTOPERATIVE DIAGNOSIS:  Right hip displaced closed intertrochanteric femur fracture.  PROCEDURE:  Open reduction and internal fixation of right intertrochanteric femur fracture using intramedullary rod and hip screw construct.  IMPLANTS:  [employer] Intertan femoral nail measuring 10 mm x 34 cm with integrated interlocking lag screw measuring 95 mm lag screw and a 90 mm compression screw.  SURGEON:  [name], M.D.  ASSISTANT:  Erskine Emery, PA-C.  ANESTHESIA:  General.  ANTIBIOTICS:  Clindamycin IV.  BLOOD LOSS:  100 mL.  COMPLICATIONS:  None.  INDICATIONS:  [name] is an 82 year old female who sustained a mechanical fall yesterday.  She was seen in [facility] and found to have a right hip intertrochanteric fracture.  She was admitted to the Medicine Service and one of my partners did see her as a consultation.  He recommended open reduction and internal fixation.  I am now proceeding with surgery.  I have talked to her in detail about this including the risks and benefits of surgery, and she does understand the need to proceed and does wish to proceed given her pain, her decreased mobility, and decreased quality of life without surgery. She was looking for improved mobility and decreased pain.  DESCRIPTION OF PROCEDURE:  After informed consent was obtained, appropriate right hip was marked.  She was brought to the operating room, and general anesthesia obtained while she was on her stretcher. Next, she was placed supine on the  fracture table with her right operative leg in in-line skeletal traction.  Perineal post in place. Her left hip abducted and flexed out of the field with appropriate padding and a well leg holder.  Time-out was called and she was identified as correct patient and correct right hip.  We then used some traction and internal rotation, reduced the fracture, and this was verified under fluoroscopy.  We chose our nail in a sterile box and chose a 10 mm x 34 cm right Intertan nail from [employer].  This was passed off sterilely to the back table.  We then prepped and draped the right hip with DuraPrep and sterile drapes.  We then made an incision proximal to the greater trochanter and dissected down the tip of greater trochanter.  Under direct fluoroscopic guidance, we placed temporary guide pin from the tip of the greater trochanter down to the proximal femur.  We used an initiating reamer to open the femoral canal and then placed a long 10 mm x 34 cm femoral nail in antegrade fashion without difficulty.  Using the outrigger guide from the nail, we were able to make a separate lateral incision.  We then placed temporary guide pin traversing the fracture into a center-center position of the femoral head.  We took a measurement of this  and chose a 95 mm lag screw and a 90 mm compression screw, integrated interlocking lag screws system.  We drilled to the depth of both of these and placed these screws without difficulty and let some traction off the leg to compress the fracture.  We put the hip through internal and external rotation. We were pleased with the positioning of all instrumentation.  The outrigger guide was removed before this.  We then irrigated 2 small wounds with normal saline solution.  We closed the deep tissue with 0 Vicryl followed by 2-0 Vicryl in the subcutaneous tissue, interrupted staples on the skin.  Xeroform and well-padded sterile dressings were applied.  She was  then taken off the fracture table, awakened, extubated, and taken to the recovery room in stable condition.  All final counts were correct.  There were no complications noted.  Of note, Erskine Emery, PA-C assisted in entire case.  His assistance was crucial for facilitating.     [name], M.D.     CYB/MEDQ  D:  08/17/2015  T:  08/18/2015  Job:  [id number]

## 2015-08-18 NOTE — Discharge Instructions (Signed)
Full weight bearing as tolerated right hip; up only with assistance Dry dressings as needed right hip incisions. Can get incisions wet in the shower.

## 2015-08-18 NOTE — Evaluation (Signed)
Physical Therapy Evaluation Patient Details Name: [name] MRN: [id number] DOB: Sep 20, 1930 Today's Date: 08/18/2015   History of Present Illness  [name] is a 82 y.o. female with medical history significant of COPD, oxygen dependent on 2 L, anxiety, anemia, CAD, PVD, HTN, and carotid artery occlusion; who presents after falling on the pavementand sustained  an intertrochanteric fracture right hip on 08/16/15. on 08/17/15 underwent IM nail  of femur.  Clinical Impression  The patient tolerated stand and pivot steps to recliner today with 2 person assist.. The patient will benefit from PT to address the problems listed in the note [place] to DC to next venue.  Follow Up Recommendations SNF;Supervision/Assistance - 24 hour    Equipment Recommendations  None recommended by PT    Recommendations for Other Services       Precautions / Restrictions Precautions Precautions: Fall Precaution Comments: on oxygen Restrictions Weight Bearing Restrictions: No      Mobility  Bed Mobility Overal bed mobility: Needs Assistance;+2 for physical assistance;+ 2 for safety/equipment Bed Mobility: Supine to Sit     Supine to sit: Max assist;+2 for physical assistance;+2 for safety/equipment;HOB elevated     General bed mobility comments: assist with r leg and trunk to upright posture, multimodal cues.  Transfers Overall transfer level: Needs assistance Equipment used: Rolling walker (2 wheeled) Transfers: Sit to/from [facility] Sit to Stand: Mod assist;+2 physical assistance;+2 safety/equipment Stand pivot transfers: Mod assist;+2 physical assistance;+2 safety/equipment       General transfer comment: cues for hand and right leg position, assist to step the  right leg forward. small hopping steps to turn  around to recliner.   Ambulation/Gait                Stairs            Wheelchair Mobility    Modified Rankin (Stroke Patients Only)        Balance                                             Pertinent Vitals/Pain Pain Assessment: 0-10 Pain Score: 5  Pain Location: R hip Pain Descriptors / Indicators: Tightness;Aching;Discomfort;Grimacing;Guarding Pain Intervention(s): Repositioned;Ice applied;Premedicated before session;Monitored during session;Limited activity within patient's tolerance    Home Living Family/patient expects to be discharged to:: [place]: Alone   Type of Home: House Home Access: Level entry     Home Layout: One [place]: [place] - 2 wheels;Walker - 4 wheels      Prior Function Level of Independence: Independent with assistive device(s)               Hand Dominance        Extremity/Trunk Assessment   Upper Extremity Assessment: Defer to OT evaluation           Lower Extremity Assessment: RLE deficits/detail RLE Deficits / Details: bears  25% weight,     Cervical / Trunk Assessment: Kyphotic  Communication   Communication: No difficulties  Cognition Arousal/Alertness: Awake/alert Behavior During Therapy: WFL for tasks assessed/performed Overall Cognitive Status: Difficult to assess Area of Impairment: Orientation Orientation Level: Time   Memory: Decreased recall of precautions;Decreased short-term memory              General Comments      Exercises  Assessment/Plan    PT Assessment Patient needs continued PT services  PT Diagnosis Difficulty walking;Acute pain   PT Problem List Decreased strength;Decreased range of motion;Decreased activity tolerance;Decreased mobility;Decreased coordination;Decreased balance;Decreased knowledge of precautions;Cardiopulmonary status limiting activity;Decreased safety awareness;Decreased knowledge of use of DME;Pain  PT Treatment Interventions DME instruction;Gait training;Functional mobility training;Therapeutic activities;Therapeutic  exercise;Patient/family education   PT Goals (Current goals can be found in the Care Plan section) Acute Rehab PT Goals Patient Stated Goal: to see my dog, go to [facility] rehab PT Goal Formulation: With patient Time For Goal Achievement: 09/01/15 Potential to Achieve Goals: Good    Frequency Min 3X/week   Barriers to discharge Decreased caregiver support      Co-evaluation               End of Session Equipment Utilized During Treatment: Gait belt Activity Tolerance: Patient limited by fatigue;Patient limited by pain Patient left: in chair;with call bell/phone within reach;with chair alarm set Nurse Communication: Mobility status         Time: 1010-1026 PT Time Calculation (min) (ACUTE ONLY): 16 min   Charges:   PT Evaluation $PT Eval Low Complexity: 1 Procedure     PT G CodesClaretha Cooper 08/18/2015, 10:59 AM Tresa Endo PT [phone]

## 2015-08-18 NOTE — Progress Notes (Signed)
Physical Therapy Treatment Patient Details Name: [name] MRN: [id number] DOB: 1930/12/27 Today's Date: 08/18/2015    History of Present Illness [name] is a 82 y.o. female with medical history significant of COPD, oxygen dependent on 2 L, anxiety, anemia, CAD, PVD, HTN, and carotid artery occlusion; who presents after falling on the pavementand sustained  an intertrochanteric fracture right hip on 08/16/15. on 08/17/15 underwent IM nail  of femur.    PT Comments    Patient is improving in mobility back to bed today. Plans SNF.  Follow Up Recommendations  SNF;Supervision/Assistance - 24 hour     Equipment Recommendations  None recommended by PT    Recommendations for Other Services       Precautions / Restrictions Precautions Precautions: Fall Precaution Comments: on oxygen    Mobility  Bed Mobility   Bed Mobility: Sit to Supine           General bed mobility comments: assist with r leg and trunk to upright posture, multimodal cues.  Transfers Overall transfer level: Needs assistance Equipment used: Rolling walker (2 wheeled) Transfers: Sit to/from Stand Sit to Stand: Mod assist;+2 physical assistance;+2 safety/equipment Stand pivot transfers: Mod assist;+2 physical assistance;+2 safety/equipment       General transfer comment: cues for hand and right leg position, assist to step the  right leg forward. small hopping steps to turn  around to recliner.   Ambulation/Gait                 Stairs            Wheelchair Mobility    Modified Rankin (Stroke Patients Only)       Balance                                    Cognition Arousal/Alertness: Awake/alert                          Exercises      General Comments        Pertinent Vitals/Pain Pain Score: 5  Pain Location: R hip Pain Descriptors / Indicators: Tender;Throbbing Pain Intervention(s): Limited activity within patient's  tolerance;Monitored during session;Premedicated before session;Repositioned;Ice applied    Home Living                      Prior Function            PT Goals (current goals can now be found in the care plan section) Progress towards PT goals: Progressing toward goals    Frequency  Min 3X/week    PT Plan Current plan remains appropriate    Co-evaluation PT/OT/SLP Co-Evaluation/Treatment: Yes Reason for Co-Treatment: For patient/therapist safety PT goals addressed during session: Mobility/safety with mobility OT goals addressed during session: ADL's and self-care     End of Session Equipment Utilized During Treatment: Gait belt Activity Tolerance: Patient tolerated treatment well Patient left: in bed;with call bell/phone within reach;with bed alarm set     Time: 1120-1135 PT Time Calculation (min) (ACUTE ONLY): 15 min  Charges:  $Therapeutic Activity: 8-22 mins                    G Codes:      [name] 08/18/2015, 5:36 PM

## 2015-08-18 NOTE — Progress Notes (Signed)
PROGRESS NOTE    [name]  RWE:[id number] DOB: 1930/08/23 DOA: 08/16/2015 PCP: Junie Panning, NP Outpatient Specialists:  Brief Narrative:  [name] is an 82 year old with a past medical history of COPD, hypothyroidism, admitted to the medicine service on 08/17/2015. She reported being in her usual state health, yesterday evening around 9 PM she was walking her dog. She reached down to pick up the dog's leash when she lost her balance and fell on her right side landing on concrete. She experienced severe right hip pain and was subsequently unable to bear weight or stand up. She called for help and a neighbor responded calling EMS. She denies symptoms prior to her fall.    Assessment & Plan:   Principal Problem:   Hip fracture, right (HCC) Active Problems:   Hypothyroidism   COPD (chronic obstructive pulmonary disease) ([place])   Fall   Anemia   Insomnia   1.  Right hip fracture. -Kaitlin Shan is an 82 year old female who suffered a fall yesterday evening landing on concrete. She had leaned over to pick up her dog's leash reporting losing her balance. Denies symptoms prior to event. -Imaging studies performed on admission revealed a right hip intertrochanteric fracture. -Requested consultation from [facility] orthopedics -On 08/17/2015 she was taken to the operating room or she underwent right hip intramedullary nail placement, procedure performed by Dr. Madelyn Flavors of orthopedic surgery. She tolerated procedure well there no immediate complications. - Repeat lab work on 08/17/2068 showed hemoglobin of 8.8, will continue to monitor.  2.  Leukocytosis. -Labs show a white count of 15,100 -Suspect related to hip fracture, repeat lab work on 08/18/2015 showing a downward trend and white count to 10,300  3.  Hypertension -Blood pressure stable, continue felodipine 10 mg by mouth daily  4.  Pain management. -Continue Percocet 1 tablet every 6 hours as needed with IV morphine for  severe breakthrough pain.   DVT prophylaxis: SCDs Code Status: DO NOT RESUSCITATE Family Communication: Disposition Plan: Plan for discharge to skilled nursing facility in the next 24-48 hours.   Consultants:   Dr. Ninfa Linden   Procedures: Right hip intramedullary nail placement performed on 08/17/2015  Subjective: States doing well this morning, has no complaints states pain is well controlled.  Objective: Filed Vitals:   08/17/15 2246 08/18/15 0036 08/18/15 0200 08/18/15 0603  BP: 116/61 121/63 123/65 109/53  Pulse: 96 98 100 88  Temp: 97.9 F (36.6 C) 98.1 F (36.7 C) 98.2 F (36.8 C) 97.9 F (36.6 C)  TempSrc: Oral Oral Oral Oral  Resp: '15 17 16 16  '$ Height:      Weight:      SpO2: 97% 97% 96% 94%    Intake/Output Summary (Last 24 hours) at 08/18/15 0849 Last data filed at 08/18/15 0604  Gross per 24 hour  Intake   1340 ml  Output   1575 ml  Net   -235 ml   Filed Weights   08/17/15 0642  Weight: 49.6 kg (109 lb 5.6 oz)    Examination:  General exam: Appears calm and comfortable  Respiratory system: Clear to auscultation. Respiratory effort normal. Cardiovascular system: S1 & S2 heard, RRR. No JVD, murmurs, rubs, gallops or clicks. No pedal edema. Gastrointestinal system: Abdomen is nondistended, soft and nontender. No organomegaly or masses felt. Normal bowel sounds heard. Central nervous system: Alert and oriented. No focal neurological deficits. Extremities: Right lower extremity is externally rotated and shorter compared to left lower extremity Skin: No rashes, lesions or ulcers  Psychiatry: Judgement and insight appear normal. Mood & affect appropriate.     Data Reviewed: I have personally reviewed following labs and imaging studies  CBC:  Recent Labs Lab 08/17/15 0109 08/18/15 0430  WBC 15.1* 10.3  NEUTROABS 14.2*  --   HGB 11.2* 8.8*  HCT 34.1* 27.1*  MCV 91.4 92.8  PLT 308 875   Basic Metabolic Panel:  Recent Labs Lab  08/17/15 0109 08/18/15 0430  NA 145 142  K 4.0 4.0  CL 109 107  CO2 26 24  GLUCOSE 171* 159*  BUN 19 18  CREATININE 0.64 0.79  CALCIUM 9.5 8.4*   GFR: Estimated Creatinine Clearance: 41.7 mL/min (by C-G formula based on Cr of 0.79). Liver Function Tests: No results for input(s): AST, ALT, ALKPHOS, BILITOT, PROT, ALBUMIN in the last 168 hours. No results for input(s): LIPASE, AMYLASE in the last 168 hours. No results for input(s): AMMONIA in the last 168 hours. Coagulation Profile:  Recent Labs Lab 08/17/15 0109  INR 1.05   Cardiac Enzymes:  Recent Labs Lab 08/17/15 0404  TROPONINI <0.03   BNP (last 3 results) No results for input(s): PROBNP in the last 8760 hours. HbA1C: No results for input(s): HGBA1C in the last 72 hours. CBG: No results for input(s): GLUCAP in the last 168 hours. Lipid Profile: No results for input(s): CHOL, HDL, LDLCALC, TRIG, CHOLHDL, LDLDIRECT in the last 72 hours. Thyroid Function Tests: No results for input(s): TSH, T4TOTAL, FREET4, T3FREE, THYROIDAB in the last 72 hours. Anemia Panel: No results for input(s): VITAMINB12, FOLATE, FERRITIN, TIBC, IRON, RETICCTPCT in the last 72 hours. Urine analysis:    Component Value Date/Time   COLORURINE YELLOW 08/17/2015 1110   APPEARANCEUR CLEAR 08/17/2015 1110   LABSPEC 1.024 08/17/2015 1110   PHURINE 6.0 08/17/2015 1110   GLUCOSEU NEGATIVE 08/17/2015 1110   HGBUR SMALL* 08/17/2015 [place] 08/17/2015 1110   KETONESUR NEGATIVE 08/17/2015 1110   PROTEINUR NEGATIVE 08/17/2015 1110   UROBILINOGEN 0.2 11/01/2013 1731   NITRITE NEGATIVE 08/17/2015 [place] 08/17/2015 1110   Sepsis Labs: No results for input(s): PROCALCITON, LATICACIDVEN in the last 168 hours.  Recent Results (from the past 240 hour(s))  Surgical pcr screen     Status: None   Collection Time: 08/17/15 11:09 AM  Result Value Ref Range Status   MRSA, PCR NEGATIVE NEGATIVE Final    Staphylococcus aureus NEGATIVE NEGATIVE Final    Comment:        The Xpert SA Assay (FDA approved for NASAL specimens in patients over 23 years of age), is one component of a comprehensive surveillance program.  Test performance has been validated by [facility] for patients greater than or equal to 3 year old. It is not intended to diagnose infection nor to guide or monitor treatment.          Radiology Studies: Dg Chest 2 View  08/17/2015  CLINICAL DATA:  Dyspnea.  Hip fracture. EXAM: CHEST  2 VIEW COMPARISON:  None. FINDINGS: A single supine AP view of the chest is negative for pneumothorax or large effusion. Mediastinal contours are normal. The lungs are clear. The pulmonary vasculature is normal. IMPRESSION: No acute cardiopulmonary findings. Electronically Signed   By: Andreas Newport M.D.   On: 08/17/2015 01:17   Ct Head Wo Contrast  08/17/2015  CLINICAL DATA:  [place] 3 hours ago. EXAM: CT HEAD WITHOUT CONTRAST TECHNIQUE: Contiguous axial images were obtained from the base of the skull through the vertex  without intravenous contrast. COMPARISON:  11/03/2013 FINDINGS: There is no intracranial hemorrhage, mass or evidence of acute infarction. There is moderate generalized atrophy. There is moderate chronic microvascular ischemic change. There is remote lacunar infarction in the right caudate and right putamen. There is no significant extra-axial fluid collection. No acute intracranial findings are evident. The calvarium and skullbase are intact. The visible paranasal sinuses and orbits are unremarkable. IMPRESSION: No acute intracranial findings. There is moderate generalized atrophy, remote lacunar infarctions, and chronic appearing white matter hypodensities which likely represent small vessel ischemic disease. Electronically Signed   By: Andreas Newport M.D.   On: 08/17/2015 00:52   Dg C-arm 1-60 Min-no Report  08/17/2015  CLINICAL DATA: right hip IM nail C-ARM 1-60 MINUTES  Fluoroscopy was utilized by the requesting physician.  No radiographic interpretation.   Dg Hip Operative Unilat With Pelvis Right  08/17/2015  CLINICAL DATA:  RIGHT intra medullary nail fixation of intertrochanteric fracture EXAM: OPERATIVE RIGHT HIP (WITH PELVIS IF PERFORMED)  VIEWS TECHNIQUE: Fluoroscopic spot image(s) were submitted for interpretation post-operatively. COMPARISON:  08/16/2015 FINDINGS: Intra medullary nail fixation of intertrochanteric fracture. Two compression screws noted. Distal nail normal. IMPRESSION: No complication following fine IM nail fixation of RIGHT intertrochanteric fracture. Electronically Signed   By: Suzy Bouchard M.D.   On: 08/17/2015 19:48   Dg Hip Unilat  With Pelvis 2-3 Views Right  08/16/2015  CLINICAL DATA:  [place] while walking her dog at 20:00. EXAM: DG HIP (WITH OR WITHOUT PELVIS) 2-3V RIGHT COMPARISON:  None. FINDINGS: There is an intertrochanteric right hip fracture, well aligned. No dislocation. Moderate right hip arthritis. Left hip is moderately arthritic as well. IMPRESSION: Intertrochanteric right hip fracture. Electronically Signed   By: Andreas Newport M.D.   On: 08/16/2015 23:59        Scheduled Meds: . arformoterol  15 mcg Nebulization BID  . budesonide (PULMICORT) nebulizer solution  1 mg Nebulization BID  . busPIRone  7.5 mg Oral BID  . citalopram  20 mg Oral Daily  . docusate sodium  100 mg Oral BID  . feeding supplement (ENSURE ENLIVE)  237 mL Oral TID BM  . felodipine  10 mg Oral Daily  . guaiFENesin  600 mg Oral BID  . levothyroxine  88 mcg Oral Daily  . pantoprazole  40 mg Oral Daily  . potassium chloride  10 mEq Oral Daily  . tiotropium  18 mcg Inhalation Daily  . umeclidinium bromide  1 puff Inhalation Daily   Continuous Infusions: . sodium chloride Stopped (08/17/15 1926)  . sodium chloride 50 mL/hr at 08/17/15 2216     LOS: 1 day    Time spent: 25 minutes    Kelvin Cellar, MD [facility] Pager  [phone]   If 7PM-7AM, please contact night-coverage [email or web address] Password [facility] 08/18/2015, 8:49 AM

## 2015-08-18 NOTE — NC FL2 (Signed)
[place] LEVEL OF CARE SCREENING TOOL     IDENTIFICATION  Patient Name: [name] Birthdate: 10-Sep-1930 Sex: female Admission Date (Current Location): 08/16/2015  [facility] and [place] Number:  Herbalist and Address:  [facility],  [place] [address], [place]      Provider Number: [id number]  Attending Physician Name and Address:  Kelvin Cellar, MD  Relative Name and Phone Number:       Current Level of Care: Hospital Recommended Level of Care: [place] Prior Approval Number:    Date Approved/Denied:   PASRR Number: [id number]  Discharge Plan: SNF    Current Diagnoses: Patient Active Problem List   Diagnosis Date Noted  . Hip fracture ([place]) 08/17/2015  . Hip fracture, right ([place]) 08/17/2015  . Fall 08/17/2015  . Anemia 08/17/2015  . Insomnia 08/17/2015  . Acute blood loss anemia 05/31/2015  . Acute upper gastrointestinal bleeding 05/29/2015  . Leukocytosis 05/29/2015  . Acute upper GI bleed 05/29/2015  . Acute encephalopathy 11/04/2013  . Noninfectious gastroenteritis and colitis 11/04/2013  . Protein-calorie malnutrition, severe ([place]) 11/02/2013  . Delirium 11/01/2013  . Altered mental state 11/01/2013  . COPD (chronic obstructive pulmonary disease) ([place]) 11/01/2013  . Chronic hypoxemic respiratory failure ([place]) 11/01/2013  . Toxic encephalopathy 10/03/2013  . UTI (urinary tract infection) 10/03/2013  . Acute-on-chronic respiratory failure ([place]) 10/03/2013  . Cerebral embolism with cerebral infarction ([place]) 09/18/2013  . Accidental overdose 09/14/2013  . Hypothermia 09/14/2013  . Spinal stenosis of lumbar region at multiple levels 02/15/2011  . PULMONARY NODULE 02/22/2010  . Nonspecific (abnormal) findings on radiological and other examination of body structure 11/29/2008  . CT, CHEST, ABNORMAL 11/29/2008  . Hypothyroidism 06/25/2007  . HYPERCHOLESTEROLEMIA 06/25/2007  . ANEMIA 06/25/2007   . AMI 06/25/2007  . ANGINA, MILD 06/25/2007  . CAD (coronary artery disease) 06/25/2007  . HEMORRHOIDS, INTERNAL 06/25/2007  . HEMORRHOIDS, EXTERNAL 06/25/2007  . HEMOCCULT POSITIVE STOOL 06/25/2007  . PYELONEPHRITIS, ACUTE 06/25/2007  . COLONIC POLYPS, HYPERPLASTIC 10/15/1997  . DIVERTICULOSIS, COLON 10/15/1997    Orientation RESPIRATION BLADDER Height & Weight     Self, Time, Situation, Place  O2 Continent Weight: 49.6 kg (109 lb 5.6 oz) Height:  '5\' 3"'$  (160 cm)  BEHAVIORAL SYMPTOMS/MOOD NEUROLOGICAL BOWEL NUTRITION STATUS  Other (Comment) (No Behaviors)   Continent Diet  AMBULATORY STATUS COMMUNICATION OF NEEDS Skin   Extensive Assist Verbally Surgical wounds                       Personal Care Assistance Level of Assistance  Bathing, Feeding, Dressing Bathing Assistance: Limited assistance Feeding assistance: Independent Dressing Assistance: Limited assistance     Functional Limitations Info  Sight, Hearing, Speech Sight Info: Adequate Hearing Info: Adequate Speech Info: Adequate    SPECIAL CARE FACTORS FREQUENCY  PT (By licensed PT), OT (By licensed OT)     PT Frequency: 5 x wk OT Frequency: 5 x wk            Contractures Contractures Info: Not present    Additional Factors Info  Code Status Code Status Info: DNR             Current Medications (08/18/2015):  This is the current hospital active medication list Current Facility-Administered Medications  Medication Dose Route Frequency Provider Last Rate Last Dose  . 0.9 %  sodium chloride infusion   Intravenous Continuous Norval Morton, MD   Stopped at 08/17/15 1926  .  0.9 %  sodium chloride infusion   Intravenous Continuous Mcarthur Rossetti, MD 50 mL/hr at 08/17/15 2216    . acetaminophen (TYLENOL) tablet 650 mg  650 mg Oral Q6H PRN Mcarthur Rossetti, MD       Or  . acetaminophen (TYLENOL) suppository 650 mg  650 mg Rectal Q6H PRN Mcarthur Rossetti, MD      . albuterol  (PROVENTIL) (2.5 MG/3ML) 0.083% nebulizer solution 2.5 mg  2.5 mg Nebulization Q4H PRN Norval Morton, MD      . arformoterol (BROVANA) nebulizer solution 15 mcg  15 mcg Nebulization BID Norval Morton, MD   15 mcg at 08/17/15 0903  . budesonide (PULMICORT) nebulizer solution 1 mg  1 mg Nebulization BID Norval Morton, MD   1 mg at 08/17/15 0903  . busPIRone (BUSPAR) tablet 7.5 mg  7.5 mg Oral BID Norval Morton, MD   Stopped at 08/17/15 1018  . citalopram (CELEXA) tablet 20 mg  20 mg Oral Daily Norval Morton, MD   Stopped at 08/17/15 1018  . docusate sodium (COLACE) capsule 100 mg  100 mg Oral BID Mcarthur Rossetti, MD   100 mg at 08/17/15 2208  . feeding supplement (ENSURE ENLIVE) (ENSURE ENLIVE) liquid 237 mL  237 mL Oral TID BM Norval Morton, MD   Stopped at 08/17/15 1019  . felodipine (PLENDIL) 24 hr tablet 10 mg  10 mg Oral Daily Norval Morton, MD   Stopped at 08/17/15 1019  . guaiFENesin (MUCINEX) 12 hr tablet 600 mg  600 mg Oral BID Norval Morton, MD   Stopped at 08/17/15 1019  . HYDROmorphone (DILAUDID) injection 0.1 mg  0.1 mg Intravenous Q2H PRN Mcarthur Rossetti, MD      . hydrOXYzine (ATARAX/VISTARIL) tablet 25-50 mg  25-50 mg Oral Daily PRN Norval Morton, MD      . levothyroxine (SYNTHROID, LEVOTHROID) tablet 88 mcg  88 mcg Oral Daily Norval Morton, MD   88 mcg at 08/17/15 0740  . menthol-cetylpyridinium (CEPACOL) lozenge 3 mg  1 lozenge Oral PRN Mcarthur Rossetti, MD       Or  . phenol (CHLORASEPTIC) mouth spray 1 spray  1 spray Mouth/Throat PRN Mcarthur Rossetti, MD      . methocarbamol (ROBAXIN) tablet 500 mg  500 mg Oral Q6H PRN Mcarthur Rossetti, MD   500 mg at 08/18/15 0036   Or  . methocarbamol (ROBAXIN) 500 mg in dextrose 5 % 50 mL IVPB  500 mg Intravenous Q6H PRN Mcarthur Rossetti, MD   500 mg at 08/17/15 2000  . metoCLOPramide (REGLAN) tablet 5-10 mg  5-10 mg Oral Q8H PRN Mcarthur Rossetti, MD       Or  . metoCLOPramide  (REGLAN) injection 5-10 mg  5-10 mg Intravenous Q8H PRN Mcarthur Rossetti, MD      . morphine 2 MG/ML injection 0.5 mg  0.5 mg Intravenous Q2H PRN Mcarthur Rossetti, MD   0.5 mg at 08/17/15 2216  . nitroGLYCERIN (NITROSTAT) SL tablet 0.4 mg  0.4 mg Sublingual Q5 min PRN Norval Morton, MD      . ondansetron (ZOFRAN) tablet 4 mg  4 mg Oral Q6H PRN Mcarthur Rossetti, MD       Or  . ondansetron [facility]) injection 4 mg  4 mg Intravenous Q6H PRN Mcarthur Rossetti, MD      . oxyCODONE-acetaminophen (PERCOCET/ROXICET) 5-325 MG per tablet 1 tablet  1 tablet Oral Q6H PRN Norval Morton, MD   1 tablet at 08/18/15 0606  . pantoprazole (PROTONIX) EC tablet 40 mg  40 mg Oral Daily Norval Morton, MD   Stopped at 08/17/15 1020  . potassium chloride (K-DUR,KLOR-CON) CR tablet 10 mEq  10 mEq Oral Daily Norval Morton, MD   Stopped at 08/17/15 1020  . tiotropium (SPIRIVA) inhalation capsule 18 mcg  18 mcg Inhalation Daily Norval Morton, MD   18 mcg at 08/17/15 0904  . umeclidinium bromide (INCRUSE ELLIPTA) 62.5 MCG/INH 1 puff  1 puff Inhalation Daily Norval Morton, MD   1 puff at 08/17/15 0905  . zolpidem (AMBIEN) tablet 5 mg  5 mg Oral QHS PRN Norval Morton, MD         Discharge Medications: Please see discharge summary for a list of discharge medications.  Relevant Imaging Results:  Relevant Lab Results:   Additional Information SS # [id number]  Marzella Miracle, Randall An, LCSW

## 2015-08-18 NOTE — Progress Notes (Signed)
Subjective: 1 Day Post-Op Procedure(s) (LRB): RIGHT HIP INTRAMEDULLARY (IM) NAIL FEMORAL (Right) Patient reports pain as moderate.  Tolerated surgery well.  Alert and oriented this am.  Acute blood loss anemia from her fracture and surgery, but stable thus far.  Objective: Vital signs in last 24 hours: Temp:  [97.7 F (36.5 C)-98.4 F (36.9 C)] 97.9 F (36.6 C) (05/11 0603) Pulse Rate:  [88-112] 88 (05/11 0603) Resp:  [12-18] 16 (05/11 0603) BP: (109-151)/(45-66) 109/53 mmHg (05/11 0603) SpO2:  [94 %-98 %] 94 % (05/11 0603)  Intake/Output from previous day: 05/10 0701 - 05/11 0700 In: 1340 [P.O.:240; I.V.:1100] Out: 1575 [Urine:1475; Blood:100] Intake/Output this shift:     Recent Labs  08/17/15 0109 08/18/15 0430  HGB 11.2* 8.8*    Recent Labs  08/17/15 0109 08/18/15 0430  WBC 15.1* 10.3  RBC 3.73* 2.92*  HCT 34.1* 27.1*  PLT 308 223    Recent Labs  08/17/15 0109 08/18/15 0430  NA 145 142  K 4.0 4.0  CL 109 107  CO2 26 24  BUN 19 18  CREATININE 0.64 0.79  GLUCOSE 171* 159*  CALCIUM 9.5 8.4*    Recent Labs  08/17/15 0109  INR 1.05    Sensation intact distally Intact pulses distally Dorsiflexion/Plantar flexion intact Incision: scant drainage  Assessment/Plan: 1 Day Post-Op Procedure(s) (LRB): RIGHT HIP INTRAMEDULLARY (IM) NAIL FEMORAL (Right) Up with therapy - WBAT right hip Will likely need short-term SNF placement Unsure about DVT coverage meds; she reports bleeding with aspirin.  Normally I would treat this type of injury with just aspirin for prophylaxis  Lajuan Kovaleski Y 08/18/2015, 9:07 AM

## 2015-08-18 NOTE — Clinical Social Work Note (Signed)
Clinical Social Work Assessment  Patient Details  Name: [name] MRN: [id number] Date of Birth: 21-Jan-1931  Date of referral:  08/18/15               Reason for consult:  Facility Placement, Discharge Planning                Permission sought to share information with:  Chartered certified accountant granted to share information::  Yes, Verbal Permission Granted  Name::        Agency::     Relationship::     Contact Information:     Housing/Transportation Living arrangements for the past 2 months:  Single Family Home Source of Information:  Patient Patient Interpreter Needed:  None Criminal Activity/Legal Involvement Pertinent to Current Situation/Hospitalization:  No - Comment as needed Significant Relationships:  Adult Children Lives with:  Self Do you feel safe going back to the place where you live?  No (SNF recommended.) Need for family participation in patient care:  Yes (Comment)  Care giving concerns:  Pt's care cannot be managed at home following hospital d/c.   Social Worker assessment / plan:  Pt hospitalized on 08/16/15 with a closed right hip fx. Surgery was preformed that last. PT has recommended SNF at d/c. CSW met with pt / spoke with daughter to review PT recommendations. Pt / family agree that [place] is needed and have requested [facility]. Clinicals have been sent to SNF. [facility] is able to accept pt when ready for d/c.  Employment status:  Retired Nurse, adult PT Recommendations:  [place] / Referral to community resources:  [place]  Patient/Family's Response to care:  Pt / family feel rehab is needed prior to returning home.  Patient/Family's Understanding of and Emotional Response to Diagnosis, Current Treatment, and Prognosis: Pt / family are aware of pt's medical status. " The doctor said I may be here for a couple more days, but I'm not sure. "  Pt / daughter are pleased that [facility] is able to accept pt for rehab. " I've been there a few times. They take good care of me. "  Emotional Assessment Appearance:  Appears stated age Attitude/Demeanor/Rapport:  Other (cooperative) Affect (typically observed):  Anxious Orientation:  Oriented to Self, Oriented to Place, Oriented to  Time, Oriented to Situation Alcohol / Substance use:  Not Applicable Psych involvement (Current and /or in the community):  No (Comment)  Discharge Needs  Concerns to be addressed:  Discharge Planning Concerns Readmission within the last 30 days:  No Current discharge risk:  None Barriers to Discharge:  No Barriers Identified   Luretha Rued, [place] 08/18/2015, 1:49 PM

## 2015-08-18 NOTE — Evaluation (Signed)
Occupational Therapy Evaluation Patient Details Name: [name] MRN: [id number] DOB: 1931/02/27 Today's Date: 08/18/2015    History of Present Illness [name] is a 82 y.o. female with medical history significant of COPD, oxygen dependent on 2 L, anxiety, anemia, CAD, PVD, HTN, and carotid artery occlusion; who presents after falling on the pavementand sustained  an intertrochanteric fracture right hip on 08/16/15. on 08/17/15 underwent IM nail  of femur.   Clinical Impression   Pt admitted with hip fx. Pt currently with functional limitations due to the deficits listed [name] (see OT Problem List).  Pt will benefit from skilled OT to increase their safety and independence with ADL and functional mobility for ADL to facilitate discharge to venue listed [name].      Follow Up Recommendations  SNF          Precautions / Restrictions Precautions Precautions: Fall Precaution Comments: on oxygen      Mobility Bed Mobility Overal bed mobility: Needs Assistance Bed Mobility: Sit to Supine     Supine to sit: Max assist;+2 for physical assistance;+2 for safety/equipment;HOB elevated     General bed mobility comments: assist with r leg and trunk to upright posture, multimodal cues.  Transfers Overall transfer level: Needs assistance Equipment used: Rolling walker (2 wheeled) Transfers: Sit to/from [facility] Sit to Stand: Mod assist;+2 physical assistance;+2 safety/equipment Stand pivot transfers: Mod assist;+2 physical assistance;+2 safety/equipment       General transfer comment: cues for hand and right leg position, assist to step the  right leg forward. small hopping steps to turn  around to recliner.          ADL Overall ADL's : Needs assistance/impaired                     Lower Body Dressing: Cueing for safety;Sit to/from stand;Maximal assistance   Toilet Transfer: Maximal assistance;RW;+2 for safety/equipment;+2 for physical  assistance Toilet Transfer Details (indicate cue type and reason): chair to bed Toileting- Clothing Manipulation and Hygiene: Sit to/from stand;Cueing for sequencing;Cueing for safety;Total assistance;+2 for physical assistance;+2 for safety/equipment                         Pertinent Vitals/Pain Pain Assessment: 0-10 Pain Score: 5  Pain Location: r hip Pain Descriptors / Indicators: Sore Pain Intervention(s): Monitored during session;Repositioned;Ice applied     Hand Dominance     Extremity/Trunk Assessment Upper Extremity Assessment Upper Extremity Assessment: Generalized weakness   Lower Extremity Assessment Lower Extremity Assessment: RLE deficits/detail RLE Deficits / Details: bears  25% weight,    Cervical / Trunk Assessment Cervical / Trunk Assessment: Kyphotic   Communication Communication Communication: No difficulties   Cognition Arousal/Alertness: Awake/alert Behavior During Therapy: WFL for tasks assessed/performed Overall Cognitive Status: Within Functional Limits for tasks assessed Area of Impairment: Orientation Orientation Level: Time   Memory: Decreased recall of precautions;Decreased short-term memory                        Home Living Family/patient expects to be discharged to:: Skilled nursing facility Living Arrangements: Alone   Type of Home: House Home Access: Level entry     Home Layout: One level               Home Equipment: Walker - 2 wheels;Walker - 4 wheels          Prior Functioning/Environment Level of Independence: Independent with assistive device(s)  OT Diagnosis: Generalized weakness;Acute pain         OT Goals(Current goals can be found in the care plan section) Acute Rehab OT Goals Patient Stated Goal: to see my dog, go to [facility] rehab  OT Frequency:     Barriers to D/C:               End of Session Equipment Utilized During Treatment: Rolling walker Nurse  Communication: Mobility status  Activity Tolerance: Patient tolerated treatment well Patient left: in bed;with call bell/phone within reach;with bed alarm set   Time: 1120-1130 OT Time Calculation (min): 10 min Charges:  OT General Charges $OT Visit: 1 Procedure OT Evaluation $OT Eval Low Complexity: 1 Procedure G-Codes:    [name] 08-22-2015, 11:41 AM

## 2015-08-19 ENCOUNTER — Inpatient Hospital Stay (HOSPITAL_COMMUNITY): Payer: Medicare Other

## 2015-08-19 LAB — CBC
HEMATOCRIT: 23.5 % — AB (ref 36.0–46.0)
Hemoglobin: 7.8 g/dL — ABNORMAL LOW (ref 12.0–15.0)
MCH: 30.8 pg (ref 26.0–34.0)
MCHC: 33.2 g/dL (ref 30.0–36.0)
MCV: 92.9 fL (ref 78.0–100.0)
Platelets: 207 10*3/uL (ref 150–400)
RBC: 2.53 MIL/uL — ABNORMAL LOW (ref 3.87–5.11)
RDW: 15.4 % (ref 11.5–15.5)
WBC: 12.2 10*3/uL — AB (ref 4.0–10.5)

## 2015-08-19 LAB — BASIC METABOLIC PANEL
ANION GAP: 10 (ref 5–15)
BUN: 23 mg/dL — AB (ref 6–20)
CALCIUM: 8.4 mg/dL — AB (ref 8.9–10.3)
CO2: 26 mmol/L (ref 22–32)
Chloride: 108 mmol/L (ref 101–111)
Creatinine, Ser: 0.76 mg/dL (ref 0.44–1.00)
GFR calc Af Amer: >60 mL/min (ref >60)
GFR calc non Af Amer: >60 mL/min (ref >60)
GLUCOSE: 129 mg/dL — AB (ref 65–99)
Potassium: 3.7 mmol/L (ref 3.5–5.1)
Sodium: 144 mmol/L (ref 135–145)

## 2015-08-19 LAB — PREPARE RBC (CROSSMATCH)

## 2015-08-19 MED ORDER — OXYCODONE HCL 5 MG PO TABS
10.0000 mg | ORAL_TABLET | Freq: Four times a day (QID) | ORAL | Status: DC | PRN
  Administered 2015-08-19 – 2015-08-22 (×10): 10 mg via ORAL
  Filled 2015-08-19 (×10): qty 2

## 2015-08-19 MED ORDER — FUROSEMIDE 10 MG/ML IJ SOLN
20.0000 mg | Freq: Once | INTRAMUSCULAR | Status: AC
  Administered 2015-08-19: 20 mg via INTRAVENOUS
  Filled 2015-08-19: qty 2

## 2015-08-19 MED ORDER — SODIUM CHLORIDE 0.9 % IV SOLN: Freq: Once | INTRAVENOUS | Status: DC

## 2015-08-19 MED ORDER — BUDESONIDE 0.5 MG/2ML IN SUSP
0.5000 mg | Freq: Two times a day (BID) | RESPIRATORY_TRACT | Status: DC
  Administered 2015-08-19 – 2015-08-22 (×7): 0.5 mg via RESPIRATORY_TRACT
  Filled 2015-08-19 (×7): qty 2

## 2015-08-19 MED ORDER — LORAZEPAM 2 MG/ML IJ SOLN
0.2500 mg | Freq: Once | INTRAMUSCULAR | Status: AC
  Administered 2015-08-19: 0.25 mg via INTRAVENOUS
  Filled 2015-08-19: qty 1

## 2015-08-19 MED ORDER — ENOXAPARIN SODIUM 30 MG/0.3ML ~~LOC~~ SOLN
30.0000 mg | SUBCUTANEOUS | Status: DC
  Administered 2015-08-19 – 2015-08-21 (×3): 30 mg via SUBCUTANEOUS
  Filled 2015-08-19 (×3): qty 0.3

## 2015-08-19 NOTE — Progress Notes (Signed)
Subjective: 2 Days Post-Op Procedure(s) (LRB): RIGHT HIP INTRAMEDULLARY (IM) NAIL FEMORAL (Right) Patient reports pain as moderate.  Making some progress with therapy.  Transfusion ordered due to symptomatic acute blood loss anemia.  Objective: Vital signs in last 24 hours: Temp:  [97.8 F (36.6 C)-98.3 F (36.8 C)] 98.3 F (36.8 C) (05/12 0540) Pulse Rate:  [80-96] 92 (05/12 1142) Resp:  [18] 18 (05/12 0540) BP: (106-137)/(47-59) 137/50 mmHg (05/12 1142) SpO2:  [95 %-99 %] 96 % (05/12 0911)  Intake/Output from previous day: 05/11 0701 - 05/12 0700 In: 1702.5 [P.O.:720; I.V.:982.5] Out: 925 [Urine:925] Intake/Output this shift:     Recent Labs  08/17/15 0109 08/18/15 0430 08/19/15 0408  HGB 11.2* 8.8* 7.8*    Recent Labs  08/18/15 0430 08/19/15 0408  WBC 10.3 12.2*  RBC 2.92* 2.53*  HCT 27.1* 23.5*  PLT 223 207    Recent Labs  08/18/15 0430 08/19/15 0408  NA 142 144  K 4.0 3.7  CL 107 108  CO2 24 26  BUN 18 23*  CREATININE 0.79 0.76  GLUCOSE 159* 129*  CALCIUM 8.4* 8.4*    Recent Labs  08/17/15 0109  INR 1.05    Sensation intact distally Intact pulses distally Dorsiflexion/Plantar flexion intact Incision: scant drainage No cellulitis present  Assessment/Plan: 2 Days Post-Op Procedure(s) (LRB): RIGHT HIP INTRAMEDULLARY (IM) NAIL FEMORAL (Right) Up with therapy Plan for discharge tomorrow Discharge to SNF  [facility] 08/19/2015, 1:13 PM

## 2015-08-19 NOTE — Progress Notes (Signed)
PROGRESS NOTE    [name]  ZOX:[id number] DOB: 1930/06/22 DOA: 08/16/2015 PCP: Junie Panning, NP Outpatient Specialists:  Brief Narrative:  [name] is an 82 year old with a past medical history of COPD, hypothyroidism, admitted to the medicine service on 08/17/2015. She reported being in her usual state health, yesterday evening around 9 PM she was walking her dog. She reached down to pick up the dog's leash when she lost her balance and fell on her right side landing on concrete. She experienced severe right hip pain and was subsequently unable to bear weight or stand up. She called for help and a neighbor responded calling EMS. She denies symptoms prior to her fall.    Assessment & Plan:   Principal Problem:   Hip fracture, right (HCC) Active Problems:   Hypothyroidism   COPD (chronic obstructive pulmonary disease) ([place])   Fall   Anemia   Insomnia   1.  Right hip fracture. -Mrs [name] is an 82 year old female who suffered a fall yesterday evening landing on concrete. She had leaned over to pick up her dog's leash reporting losing her balance. Denies symptoms prior to event. -Imaging studies performed on admission revealed a right hip intertrochanteric fracture. -Requested consultation from [facility] orthopedics -On 08/17/2015 she was taken to the operating room or she underwent right hip intramedullary nail placement, procedure performed by Dr. Ninfa Linden of orthopedic surgery. She tolerated procedure well there no immediate complications. - Review lab work on 08/19/2015 showing a downward trend in her hemoglobin to 7.8. On this date she was typed and cross and transfuse with a unit of packed red blood cells.  2.  Leukocytosis. -Labs show a white count of 15,100 -Suspect related to hip fracture, repeat lab work on 08/18/2015 showing a downward trend and white count to 10,300  3.  Hypertension -Blood pressure stable, continue felodipine 10 mg by mouth daily  4.  Pain  management. -Patient reporting right hip pain, uncontrolled with 5 mg of oxycodone, will increase oxycodone to 10 mg every 6 hours as needed   DVT prophylaxis: SCDs Code Status: DO NOT RESUSCITATE Family Communication: Disposition Plan: Plan for blood transfusion today, repeat CBC in a.m., anticipate discharge to skilled nursing facility on 08/20/2015   Consultants:   Dr. Ninfa Linden   Procedures: Right hip intramedullary nail placement performed on 08/17/2015  Subjective: Patient reporting this morning pain symptoms uncontrolled  Objective: Filed Vitals:   08/18/15 2019 08/18/15 2209 08/19/15 0540 08/19/15 0911  BP:  112/49 136/59   Pulse:  85 80   Temp:  98.2 F (36.8 C) 98.3 F (36.8 C)   TempSrc:  Oral Oral   Resp:  18 18   Height:      Weight:      SpO2: 98% 98% 99% 96%    Intake/Output Summary (Last 24 hours) at 08/19/15 1001 Last data filed at 08/19/15 0541  Gross per 24 hour  Intake  992.5 ml  Output    625 ml  Net  367.5 ml   Filed Weights   08/17/15 0642  Weight: 49.6 kg (109 lb 5.6 oz)    Examination:  General exam: Appears calm and comfortable  Respiratory system: Clear to auscultation. Respiratory effort normal. Cardiovascular system: S1 & S2 heard, RRR. No JVD, murmurs, rubs, gallops or clicks. No pedal edema. Gastrointestinal system: Abdomen is nondistended, soft and nontender. No organomegaly or masses felt. Normal bowel sounds heard. Central nervous system: Alert and oriented. No focal neurological deficits. Extremities: Right lower  extremity is externally rotated and shorter compared to left lower extremity Skin: No rashes, lesions or ulcers Psychiatry: Judgement and insight appear normal. Mood & affect appropriate.     Data Reviewed: I have personally reviewed following labs and imaging studies  CBC:  Recent Labs Lab 08/17/15 0109 08/18/15 0430 08/19/15 0408  WBC 15.1* 10.3 12.2*  NEUTROABS 14.2*  --   --   HGB 11.2* 8.8* 7.8*    HCT 34.1* 27.1* 23.5*  MCV 91.4 92.8 92.9  PLT 308 223 124   Basic Metabolic Panel:  Recent Labs Lab 08/17/15 0109 08/18/15 0430 08/19/15 0408  NA 145 142 144  K 4.0 4.0 3.7  CL 109 107 108  CO2 '26 24 26  '$ GLUCOSE 171* 159* 129*  BUN 19 18 23*  CREATININE 0.64 0.79 0.76  CALCIUM 9.5 8.4* 8.4*   GFR: Estimated Creatinine Clearance: 41.7 mL/min (by C-G formula based on Cr of 0.76). Liver Function Tests: No results for input(s): AST, ALT, ALKPHOS, BILITOT, PROT, ALBUMIN in the last 168 hours. No results for input(s): LIPASE, AMYLASE in the last 168 hours. No results for input(s): AMMONIA in the last 168 hours. Coagulation Profile:  Recent Labs Lab 08/17/15 0109  INR 1.05   Cardiac Enzymes:  Recent Labs Lab 08/17/15 0404  TROPONINI <0.03   BNP (last 3 results) No results for input(s): PROBNP in the last 8760 hours. HbA1C: No results for input(s): HGBA1C in the last 72 hours. CBG: No results for input(s): GLUCAP in the last 168 hours. Lipid Profile: No results for input(s): CHOL, HDL, LDLCALC, TRIG, CHOLHDL, LDLDIRECT in the last 72 hours. Thyroid Function Tests: No results for input(s): TSH, T4TOTAL, FREET4, T3FREE, THYROIDAB in the last 72 hours. Anemia Panel: No results for input(s): VITAMINB12, FOLATE, FERRITIN, TIBC, IRON, RETICCTPCT in the last 72 hours. Urine analysis:    Component Value Date/Time   COLORURINE YELLOW 08/17/2015 1110   APPEARANCEUR CLEAR 08/17/2015 1110   LABSPEC 1.024 08/17/2015 1110   PHURINE 6.0 08/17/2015 1110   GLUCOSEU NEGATIVE 08/17/2015 1110   HGBUR SMALL* 08/17/2015 [place] 08/17/2015 1110   KETONESUR NEGATIVE 08/17/2015 1110   PROTEINUR NEGATIVE 08/17/2015 1110   UROBILINOGEN 0.2 11/01/2013 1731   NITRITE NEGATIVE 08/17/2015 [place] 08/17/2015 1110   Sepsis Labs: No results for input(s): PROCALCITON, LATICACIDVEN in the last 168 hours.  Recent Results (from the past 240  hour(s))  Surgical pcr screen     Status: None   Collection Time: 08/17/15 11:09 AM  Result Value Ref Range Status   MRSA, PCR NEGATIVE NEGATIVE Final   Staphylococcus aureus NEGATIVE NEGATIVE Final    Comment:        The Xpert SA Assay (FDA approved for NASAL specimens in patients over 64 years of age), is one component of a comprehensive surveillance program.  Test performance has been validated by [facility] for patients greater than or equal to 68 year old. It is not intended to diagnose infection nor to guide or monitor treatment.          Radiology Studies: Dg C-arm 1-60 Min-no Report  08/17/2015  CLINICAL DATA: right hip IM nail C-ARM 1-60 MINUTES Fluoroscopy was utilized by the requesting physician.  No radiographic interpretation.   Dg Hip Operative Unilat With Pelvis Right  08/17/2015  CLINICAL DATA:  RIGHT intra medullary nail fixation of intertrochanteric fracture EXAM: OPERATIVE RIGHT HIP (WITH PELVIS IF PERFORMED)  VIEWS TECHNIQUE: Fluoroscopic spot image(s) were submitted for  interpretation post-operatively. COMPARISON:  08/16/2015 FINDINGS: Intra medullary nail fixation of intertrochanteric fracture. Two compression screws noted. Distal nail normal. IMPRESSION: No complication following fine IM nail fixation of RIGHT intertrochanteric fracture. Electronically Signed   By: Suzy Bouchard M.D.   On: 08/17/2015 19:48        Scheduled Meds: . sodium chloride   Intravenous Once  . arformoterol  15 mcg Nebulization BID  . budesonide (PULMICORT) nebulizer solution  0.5 mg Nebulization BID  . busPIRone  7.5 mg Oral BID  . citalopram  20 mg Oral Daily  . docusate sodium  100 mg Oral BID  . feeding supplement (ENSURE ENLIVE)  237 mL Oral TID BM  . felodipine  10 mg Oral Daily  . guaiFENesin  600 mg Oral BID  . levothyroxine  88 mcg Oral Daily  . pantoprazole  40 mg Oral Daily  . potassium chloride  10 mEq Oral Daily  . tiotropium  18 mcg Inhalation Daily  .  umeclidinium bromide  1 puff Inhalation Daily   Continuous Infusions: . sodium chloride Stopped (08/17/15 1926)  . sodium chloride Stopped (08/18/15 1755)     LOS: 2 days    Time spent: 25 minutes    Kelvin Cellar, MD [facility] Pager [phone]   If 7PM-7AM, please contact night-coverage [email or web address] Password [email or web address] 08/19/2015, 10:01 AM

## 2015-08-19 NOTE — Progress Notes (Signed)
Patient 87-89% on 4LNC. Respiratory and rapid response notified. Venturi mask at 14L applied. Patient 96% after venturi mask applied.

## 2015-08-19 NOTE — Progress Notes (Signed)
Pt refuses to have foley removed, POD#2, Will confer with dayshift admitting Nurse and follow up will be completed.

## 2015-08-20 DIAGNOSIS — J9601 Acute respiratory failure with hypoxia: Secondary | ICD-10-CM

## 2015-08-20 LAB — CBC
HEMATOCRIT: 28.4 % — AB (ref 36.0–46.0)
HEMOGLOBIN: 9.5 g/dL — AB (ref 12.0–15.0)
MCH: 30.6 pg (ref 26.0–34.0)
MCHC: 33.5 g/dL (ref 30.0–36.0)
MCV: 91.6 fL (ref 78.0–100.0)
PLATELETS: 216 10*3/uL (ref 150–400)
RBC: 3.1 MIL/uL — AB (ref 3.87–5.11)
RDW: 14.9 % (ref 11.5–15.5)
WBC: 11.5 10*3/uL — AB (ref 4.0–10.5)

## 2015-08-20 LAB — BASIC METABOLIC PANEL
ANION GAP: 9 (ref 5–15)
BUN: 16 mg/dL (ref 6–20)
CHLORIDE: 107 mmol/L (ref 101–111)
CO2: 28 mmol/L (ref 22–32)
Calcium: 8.6 mg/dL — ABNORMAL LOW (ref 8.9–10.3)
Creatinine, Ser: 0.55 mg/dL (ref 0.44–1.00)
GFR calc Af Amer: >60 mL/min (ref >60)
GLUCOSE: 139 mg/dL — AB (ref 65–99)
Potassium: 3.2 mmol/L — ABNORMAL LOW (ref 3.5–5.1)
Sodium: 144 mmol/L (ref 135–145)

## 2015-08-20 NOTE — Progress Notes (Signed)
CSW assisting with d/c planning. PN reviewed. [facility] contacted and confirmed d/c plan to SNF on 08/21/15 if stable for d/c. Weekend CSW will assist d/c planning ( [phone] ).  Werner Lean LCSW [phone]

## 2015-08-20 NOTE — Progress Notes (Signed)
PROGRESS NOTE    [name]  AOZ:[id number] DOB: 01/13/1931 DOA: 08/16/2015 PCP: Junie Panning, NP Outpatient Specialists:  Brief Narrative:  [name] is an 82 year old with a past medical history of COPD, hypothyroidism, admitted to the medicine service on 08/17/2015. She reported being in her usual state health, yesterday evening around 9 PM she was walking her dog. She reached down to pick up the dog's leash when she lost her balance and fell on her right side landing on concrete. She experienced severe right hip pain and was subsequently unable to bear weight or stand up. She called for help and a neighbor responded calling EMS. She denies symptoms prior to her fall. On 08/17/2015 she was taken to the operating room where she underwent right hip intramedullary nail placement, procedure performed by Dr. Ninfa Linden of orthopedic surgery. Postoperatively her hemoglobin trended down to 7.8, she was transfused with 1 unit of packed red blood cells that was complicated by volume overload and required IV Lasix.    Assessment & Plan:   Principal Problem:   Hip fracture, right (HCC) Active Problems:   Hypothyroidism   COPD (chronic obstructive pulmonary disease) ([place])   Fall   Anemia   Insomnia   1.  Right hip fracture. -[name] is an 82 year old female who suffered a fall yesterday evening landing on concrete. She had leaned over to pick up her dog's leash reporting losing her balance. Denies symptoms prior to event. -Imaging studies performed on admission revealed a right hip intertrochanteric fracture. -Requested consultation from [facility] orthopedics -On 08/17/2015 she was taken to the operating room or she underwent right hip intramedullary nail placement, procedure performed by Dr. Ninfa Linden of orthopedic surgery. She tolerated procedure well there no immediate complications. - Review lab work on 08/19/2015 showing a downward trend in her hemoglobin to 7.8. On this date she  was typed and cross and transfuse with a unit of packed red blood cells.  -Repeat labs showing hemoglobin of 9.5.  2.  Acute respiratory failure/Volume overload -On 08/19/2015 she developed increasing shortness of breath going into respiratory distress after receiving a unit of packed red blood cells. She had required placement of injury mask at 14L -Having significant improvement after the administration of a total of 40 mg of IV Lasix -This morning she is satting 96% on 4 L nasal cannula  3.  Leukocytosis. -Labs show a white count of 15,100 -Suspect related to hip fracture, repeat lab work on 08/18/2015 showing a downward trend and white count to 10,300  4.  Hypertension -Blood pressure stable, continue felodipine 10 mg by mouth daily  5.  Pain management. -[name] reporting right hip pain, uncontrolled with 5 mg of oxycodone, increased oxycodone to 10 mg every 6 hours as needed   DVT prophylaxis: Lovenox Code Status: DO NOT RESUSCITATE Family Communication: Disposition Plan: [name]'s respiratory status will be monitored for the next 24 hours, anticipate discharge in a.m. if she continues to show improvement from a respiratory standpoint.   Consultants:   Dr. Ninfa Linden   Procedures: Right hip intramedullary nail placement performed on 08/17/2015  Subjective: [name] reporting this morning pain symptoms uncontrolled  Objective: Filed Vitals:   08/20/15 0405 08/20/15 0745 08/20/15 0748 08/20/15 0749  BP: 129/51     Pulse: 95     Temp: 98.3 F (36.8 C)     TempSrc: Oral     Resp: 20     Height:      Weight:  SpO2: 96% 96% 96% 96%    Intake/Output Summary (Last 24 hours) at 08/20/15 0920 Last data filed at 08/20/15 0408  Gross per 24 hour  Intake    420 ml  Output   1750 ml  Net  -1330 ml   Filed Weights   08/17/15 7062  Weight: 49.6 kg (109 lb 5.6 oz)    Examination:  General exam: Appears calm and comfortable  Respiratory system: Clear to  auscultation. Respiratory effort normal. Cardiovascular system: S1 & S2 heard, RRR. No JVD, murmurs, rubs, gallops or clicks. No pedal edema. Gastrointestinal system: Abdomen is nondistended, soft and nontender. No organomegaly or masses felt. Normal bowel sounds heard. Central nervous system: Alert and oriented. No focal neurological deficits. Extremities: Right lower extremity is externally rotated and shorter compared to left lower extremity Skin: No rashes, lesions or ulcers Psychiatry: Judgement and insight appear normal. Mood & affect appropriate.     Data Reviewed: I have personally reviewed following labs and imaging studies  CBC:  Recent Labs Lab 08/17/15 0109 08/18/15 0430 08/19/15 0408 08/20/15 0402  WBC 15.1* 10.3 12.2* 11.5*  NEUTROABS 14.2*  --   --   --   HGB 11.2* 8.8* 7.8* 9.5*  HCT 34.1* 27.1* 23.5* 28.4*  MCV 91.4 92.8 92.9 91.6  PLT 308 223 207 376   Basic Metabolic Panel:  Recent Labs Lab 08/17/15 0109 08/18/15 0430 08/19/15 0408 08/20/15 0402  NA 145 142 144 144  K 4.0 4.0 3.7 3.2*  CL 109 107 108 107  CO2 '26 24 26 28  '$ GLUCOSE 171* 159* 129* 139*  BUN 19 18 23* 16  CREATININE 0.64 0.79 0.76 0.55  CALCIUM 9.5 8.4* 8.4* 8.6*   GFR: Estimated Creatinine Clearance: 41.7 mL/min (by C-G formula based on Cr of 0.55). Liver Function Tests: No results for input(s): AST, ALT, ALKPHOS, BILITOT, PROT, ALBUMIN in the last 168 hours. No results for input(s): LIPASE, AMYLASE in the last 168 hours. No results for input(s): AMMONIA in the last 168 hours. Coagulation Profile:  Recent Labs Lab 08/17/15 0109  INR 1.05   Cardiac Enzymes:  Recent Labs Lab 08/17/15 0404  TROPONINI <0.03   BNP (last 3 results) No results for input(s): PROBNP in the last 8760 hours. HbA1C: No results for input(s): HGBA1C in the last 72 hours. CBG: No results for input(s): GLUCAP in the last 168 hours. Lipid Profile: No results for input(s): CHOL, HDL, LDLCALC, TRIG,  CHOLHDL, LDLDIRECT in the last 72 hours. Thyroid Function Tests: No results for input(s): TSH, T4TOTAL, FREET4, T3FREE, THYROIDAB in the last 72 hours. Anemia Panel: No results for input(s): VITAMINB12, FOLATE, FERRITIN, TIBC, IRON, RETICCTPCT in the last 72 hours. Urine analysis:    Component Value Date/Time   COLORURINE YELLOW 08/17/2015 1110   APPEARANCEUR CLEAR 08/17/2015 1110   LABSPEC 1.024 08/17/2015 1110   PHURINE 6.0 08/17/2015 1110   GLUCOSEU NEGATIVE 08/17/2015 1110   HGBUR SMALL* 08/17/2015 [place] 08/17/2015 1110   KETONESUR NEGATIVE 08/17/2015 1110   PROTEINUR NEGATIVE 08/17/2015 1110   UROBILINOGEN 0.2 11/01/2013 1731   NITRITE NEGATIVE 08/17/2015 [place] 08/17/2015 1110   Sepsis Labs: No results for input(s): PROCALCITON, LATICACIDVEN in the last 168 hours.  Recent Results (from the past 240 hour(s))  Surgical pcr screen     Status: None   Collection Time: 08/17/15 11:09 AM  Result Value Ref Range Status   MRSA, PCR NEGATIVE NEGATIVE Final   Staphylococcus aureus NEGATIVE NEGATIVE  Final    Comment:        The Xpert SA Assay (FDA approved for NASAL specimens in patients over 17 years of age), is one component of a comprehensive surveillance program.  Test performance has been validated by [facility] for patients greater than or equal to 26 year old. It is not intended to diagnose infection nor to guide or monitor treatment.          Radiology Studies: Dg Chest 1 View  08/19/2015  CLINICAL DATA:  82 year old female with shortness of breath. Right hip fracture. EXAM: CHEST 1 VIEW COMPARISON:  Chest x-ray 08/17/2015. FINDINGS: Mild emphysematous changes are noted throughout the lungs bilaterally. No acute consolidative airspace disease. No pleural effusions. No evidence of pulmonary edema. Heart size is normal. New fullness in the right hilar region. Atherosclerosis in the thoracic aorta. IMPRESSION: 1. New  fullness in the right hilar region. This is favored to be vascular, and could represent an acutely enlarged pulmonary artery (e.g., "Fleischner sign" associated with pulmonary embolism). Given the [name]'s history, clinical correlation for signs and symptoms of pulmonary embolism is suggested, with consideration for further evaluation with PE protocol CT scan if clinically appropriate. 2. Atherosclerosis. These results will be called to the ordering clinician or representative by the Radiologist Assistant, and communication documented in the PACS or zVision Dashboard. Electronically Signed   By: Vinnie Langton M.D.   On: 08/19/2015 19:57        Scheduled Meds: . sodium chloride   Intravenous Once  . arformoterol  15 mcg Nebulization BID  . budesonide (PULMICORT) nebulizer solution  0.5 mg Nebulization BID  . busPIRone  7.5 mg Oral BID  . citalopram  20 mg Oral Daily  . docusate sodium  100 mg Oral BID  . enoxaparin (LOVENOX) injection  30 mg Subcutaneous Q24H  . feeding supplement (ENSURE ENLIVE)  237 mL Oral TID BM  . felodipine  10 mg Oral Daily  . guaiFENesin  600 mg Oral BID  . levothyroxine  88 mcg Oral Daily  . pantoprazole  40 mg Oral Daily  . potassium chloride  10 mEq Oral Daily  . tiotropium  18 mcg Inhalation Daily  . umeclidinium bromide  1 puff Inhalation Daily   Continuous Infusions:     LOS: 3 days    Time spent: 35 minutes    Kelvin Cellar, MD [facility] Pager [phone]   If 7PM-7AM, please contact night-coverage [email or web address] Password [email or web address] 08/20/2015, 9:20 AM

## 2015-08-21 ENCOUNTER — Encounter (HOSPITAL_COMMUNITY): Payer: Self-pay | Admitting: Radiology

## 2015-08-21 ENCOUNTER — Inpatient Hospital Stay (HOSPITAL_COMMUNITY): Payer: Medicare Other

## 2015-08-21 DIAGNOSIS — R0600 Dyspnea, unspecified: Secondary | ICD-10-CM

## 2015-08-21 DIAGNOSIS — D62 Acute posthemorrhagic anemia: Secondary | ICD-10-CM

## 2015-08-21 LAB — CBC
HEMATOCRIT: 28.7 % — AB (ref 36.0–46.0)
HEMOGLOBIN: 9.7 g/dL — AB (ref 12.0–15.0)
MCH: 30.3 pg (ref 26.0–34.0)
MCHC: 33.8 g/dL (ref 30.0–36.0)
MCV: 89.7 fL (ref 78.0–100.0)
Platelets: 237 10*3/uL (ref 150–400)
RBC: 3.2 MIL/uL — ABNORMAL LOW (ref 3.87–5.11)
RDW: 14.5 % (ref 11.5–15.5)
WBC: 10.4 10*3/uL (ref 4.0–10.5)

## 2015-08-21 MED ORDER — IOPAMIDOL (ISOVUE-370) INJECTION 76%
100.0000 mL | Freq: Once | INTRAVENOUS | Status: AC | PRN
  Administered 2015-08-21: 100 mL via INTRAVENOUS

## 2015-08-21 NOTE — Progress Notes (Signed)
Follow up note Reviewed  results from CT scan of lungs. Unfortunately study showing multiple pulmonary lesions bilaterally having the appearance for probable multifocal primary bronchogenic adenocarcinoma. Will obtain a CT of abdomen and pelvis for staging purposes. Patient and family members updated. Will need biopsy. Will discuss further with pulmonary critical care medicine.

## 2015-08-21 NOTE — Progress Notes (Signed)
PT Cancellation Note  Patient Details Name: [name] MRN: [id number] DOB: October 21, 1930   Cancelled Treatment:    Reason Eval/Treat Not Completed: Patient at procedure or test/unavailable (having CT of chest for WU of possible lung lesions.)   Claretha Cooper 08/21/2015, 3:53 PM Tresa Endo PT [phone]

## 2015-08-21 NOTE — Progress Notes (Signed)
PROGRESS NOTE    [name]  ZOX:[id number] DOB: Sep 21, 1930 DOA: 08/16/2015 PCP: Junie Panning, NP Outpatient Specialists:  Brief Narrative:  [name] is an 82 year old with a past medical history of COPD, hypothyroidism, admitted to the medicine service on 08/17/2015. She reported being in her usual state health, yesterday evening around 9 PM she was walking her dog. She reached down to pick up the dog's leash when she lost her balance and fell on her right side landing on concrete. She experienced severe right hip pain and was subsequently unable to bear weight or stand up. She called for help and a neighbor responded calling EMS. She denies symptoms prior to her fall. On 08/17/2015 she was taken to the operating room where she underwent right hip intramedullary nail placement, procedure performed by Dr. Ninfa Linden of orthopedic surgery. Postoperatively her hemoglobin trended down to 7.8, she was transfused with 1 unit of packed red blood cells that was complicated by volume overload and required IV Lasix.    Assessment & Plan:   Principal Problem:   Hip fracture, right (HCC) Active Problems:   Hypothyroidism   COPD (chronic obstructive pulmonary disease) ([place])   Fall   Anemia   Insomnia   1.  Right hip fracture. -[name] is an 82 year old female who suffered a fall yesterday evening landing on concrete. She had leaned over to pick up her dog's leash reporting losing her balance. Denies symptoms prior to event. -Imaging studies performed on admission revealed a right hip intertrochanteric fracture. -Requested consultation from [facility] orthopedics -On 08/17/2015 she was taken to the operating room or she underwent right hip intramedullary nail placement, procedure performed by Dr. Ninfa Linden of orthopedic surgery. She tolerated procedure well there no immediate complications. - Review lab work on 08/19/2015 showing a downward trend in her hemoglobin to 7.8. On this date she  was typed and cross and transfuse with a unit of packed red blood cells.  -On Repeat labs on 08/21/2015 show a hemoglobin of 9.7.  2.  Acute respiratory failure/Volume overload -On 08/19/2015 she developed increasing shortness of breath going into respiratory distress after receiving a unit of packed red blood cells. She had required placement of injury mask at 14L -On my evaluation on 08/21/2015 she continues to report nonexertional dyspnea. On 4 L she was satting 88-89 percent however nursing staff reporting that while asleep she had been satting in the mid 90s. I think there is an anxiety component however other considerations must be taken into account. On the chest x-ray report from 08/19/2015 the radiologist had mentioned fullness in the right hilar region favoring vascular and stated this could represent an acutely enlarged pulmonary artery associated with pulmonary embolism. -Since she continues to complain of shortness of breath I think it would be prudent to check a CT scan based on her clinical status and previous CXR.   3.  Leukocytosis. -Resolved.  4.  Hypertension -Blood pressure stable, continue felodipine 10 mg by mouth daily  5.  Pain management. -Patient reporting right hip pain, uncontrolled with 5 mg of oxycodone, increased oxycodone to 10 mg every 6 hours as needed   DVT prophylaxis: Lovenox Code Status: DO NOT RESUSCITATE Family Communication: Disposition Plan: On my evaluation today she reports shortness of breath, having oxygen saturations of 89% on 4 L supplemental oxygen. Plan to further workup with CT scan.   Consultants:   Dr. Ninfa Linden   Procedures: Right hip intramedullary nail placement performed on 08/17/2015  Subjective: Patient reporting  this morning pain symptoms uncontrolled complains of increasing shortness of breath this morning.  Objective: Filed Vitals:   08/21/15 0542 08/21/15 0943 08/21/15 0944 08/21/15 0946  BP: 129/58     Pulse: 85      Temp: 98.4 F (36.9 C)     TempSrc: Oral     Resp: 18     Height:      Weight:      SpO2: 90% 93% 93% 93%    Intake/Output Summary (Last 24 hours) at 08/21/15 1007 Last data filed at 08/21/15 0543  Gross per 24 hour  Intake    270 ml  Output    600 ml  Net   -330 ml   Filed Weights   08/17/15 0642  Weight: 49.6 kg (109 lb 5.6 oz)    Examination:  General exam: She appears dyspneic Respiratory system: Clear to auscultation. Respiratory effort normal. Cardiovascular system: S1 & S2 heard, RRR. No JVD, murmurs, rubs, gallops or clicks. No pedal edema. Gastrointestinal system: Abdomen is nondistended, soft and nontender. No organomegaly or masses felt. Normal bowel sounds heard. Central nervous system: Alert and oriented. No focal neurological deficits. Extremities: Right lower extremity is externally rotated and shorter compared to left lower extremity Skin: No rashes, lesions or ulcers Psychiatry: Judgement and insight appear normal. Mood & affect appropriate.     Data Reviewed: I have personally reviewed following labs and imaging studies  CBC:  Recent Labs Lab 08/17/15 0109 08/18/15 0430 08/19/15 0408 08/20/15 0402 08/21/15 0917  WBC 15.1* 10.3 12.2* 11.5* 10.4  NEUTROABS 14.2*  --   --   --   --   HGB 11.2* 8.8* 7.8* 9.5* 9.7*  HCT 34.1* 27.1* 23.5* 28.4* 28.7*  MCV 91.4 92.8 92.9 91.6 89.7  PLT 308 223 207 216 409   Basic Metabolic Panel:  Recent Labs Lab 08/17/15 0109 08/18/15 0430 08/19/15 0408 08/20/15 0402  NA 145 142 144 144  K 4.0 4.0 3.7 3.2*  CL 109 107 108 107  CO2 '26 24 26 28  '$ GLUCOSE 171* 159* 129* 139*  BUN 19 18 23* 16  CREATININE 0.64 0.79 0.76 0.55  CALCIUM 9.5 8.4* 8.4* 8.6*   GFR: Estimated Creatinine Clearance: 41.7 mL/min (by C-G formula based on Cr of 0.55). Liver Function Tests: No results for input(s): AST, ALT, ALKPHOS, BILITOT, PROT, ALBUMIN in the last 168 hours. No results for input(s): LIPASE, AMYLASE in the last  168 hours. No results for input(s): AMMONIA in the last 168 hours. Coagulation Profile:  Recent Labs Lab 08/17/15 0109  INR 1.05   Cardiac Enzymes:  Recent Labs Lab 08/17/15 0404  TROPONINI <0.03   BNP (last 3 results) No results for input(s): PROBNP in the last 8760 hours. HbA1C: No results for input(s): HGBA1C in the last 72 hours. CBG: No results for input(s): GLUCAP in the last 168 hours. Lipid Profile: No results for input(s): CHOL, HDL, LDLCALC, TRIG, CHOLHDL, LDLDIRECT in the last 72 hours. Thyroid Function Tests: No results for input(s): TSH, T4TOTAL, FREET4, T3FREE, THYROIDAB in the last 72 hours. Anemia Panel: No results for input(s): VITAMINB12, FOLATE, FERRITIN, TIBC, IRON, RETICCTPCT in the last 72 hours. Urine analysis:    Component Value Date/Time   COLORURINE YELLOW 08/17/2015 1110   APPEARANCEUR CLEAR 08/17/2015 1110   LABSPEC 1.024 08/17/2015 1110   PHURINE 6.0 08/17/2015 1110   GLUCOSEU NEGATIVE 08/17/2015 1110   HGBUR SMALL* 08/17/2015 [place] 08/17/2015 1110   [place] 08/17/2015 1110  PROTEINUR NEGATIVE 08/17/2015 1110   UROBILINOGEN 0.2 11/01/2013 1731   NITRITE NEGATIVE 08/17/2015 [place] 08/17/2015 1110   Sepsis Labs: No results for input(s): PROCALCITON, LATICACIDVEN in the last 168 hours.  Recent Results (from the past 240 hour(s))  Surgical pcr screen     Status: None   Collection Time: 08/17/15 11:09 AM  Result Value Ref Range Status   MRSA, PCR NEGATIVE NEGATIVE Final   Staphylococcus aureus NEGATIVE NEGATIVE Final    Comment:        The Xpert SA Assay (FDA approved for NASAL specimens in patients over 23 years of age), is one component of a comprehensive surveillance program.  Test performance has been validated by [facility] for patients greater than or equal to 69 year old. It is not intended to diagnose infection nor to guide or monitor treatment.           Radiology Studies: Dg Chest 1 View  08/19/2015  CLINICAL DATA:  82 year old female with shortness of breath. Right hip fracture. EXAM: CHEST 1 VIEW COMPARISON:  Chest x-ray 08/17/2015. FINDINGS: Mild emphysematous changes are noted throughout the lungs bilaterally. No acute consolidative airspace disease. No pleural effusions. No evidence of pulmonary edema. Heart size is normal. New fullness in the right hilar region. Atherosclerosis in the thoracic aorta. IMPRESSION: 1. New fullness in the right hilar region. This is favored to be vascular, and could represent an acutely enlarged pulmonary artery (e.g., "Fleischner sign" associated with pulmonary embolism). Given the patient's history, clinical correlation for signs and symptoms of pulmonary embolism is suggested, with consideration for further evaluation with PE protocol CT scan if clinically appropriate. 2. Atherosclerosis. These results will be called to the ordering clinician or representative by the Radiologist Assistant, and communication documented in the PACS or zVision Dashboard. Electronically Signed   By: Vinnie Langton M.D.   On: 08/19/2015 19:57        Scheduled Meds: . sodium chloride   Intravenous Once  . arformoterol  15 mcg Nebulization BID  . budesonide (PULMICORT) nebulizer solution  0.5 mg Nebulization BID  . busPIRone  7.5 mg Oral BID  . citalopram  20 mg Oral Daily  . docusate sodium  100 mg Oral BID  . enoxaparin (LOVENOX) injection  30 mg Subcutaneous Q24H  . feeding supplement (ENSURE ENLIVE)  237 mL Oral TID BM  . felodipine  10 mg Oral Daily  . guaiFENesin  600 mg Oral BID  . levothyroxine  88 mcg Oral Daily  . pantoprazole  40 mg Oral Daily  . potassium chloride  10 mEq Oral Daily  . tiotropium  18 mcg Inhalation Daily  . umeclidinium bromide  1 puff Inhalation Daily   Continuous Infusions:     LOS: 4 days    Time spent: 35 minutes    Kelvin Cellar, MD [facility] Pager  [phone]   If 7PM-7AM, please contact night-coverage [email or web address] Password [email or web address] 08/21/2015, 10:07 AM

## 2015-08-21 NOTE — Clinical Social Work Note (Addendum)
CSW updated [facility] that patient is not medically ready for discharge today.  Unit weekday CSW to continue to follow patient's progress.  Jones Broom. [place], MSW, [place] 08/21/2015 3:54 PM

## 2015-08-22 ENCOUNTER — Encounter (HOSPITAL_COMMUNITY): Payer: Self-pay | Admitting: Radiology

## 2015-08-22 ENCOUNTER — Inpatient Hospital Stay (HOSPITAL_COMMUNITY): Payer: Medicare Other

## 2015-08-22 DIAGNOSIS — R918 Other nonspecific abnormal finding of lung field: Secondary | ICD-10-CM

## 2015-08-22 DIAGNOSIS — J438 Other emphysema: Secondary | ICD-10-CM

## 2015-08-22 LAB — TYPE AND SCREEN
ABO/RH(D): O POS
ANTIBODY SCREEN: POSITIVE
DAT, IGG: NEGATIVE
DONOR AG TYPE: NEGATIVE
Donor AG Type: NEGATIVE
UNIT DIVISION: 0
Unit division: 0

## 2015-08-22 LAB — BASIC METABOLIC PANEL
ANION GAP: 5 (ref 5–15)
BUN: 16 mg/dL (ref 6–20)
CHLORIDE: 107 mmol/L (ref 101–111)
CO2: 28 mmol/L (ref 22–32)
Calcium: 8.4 mg/dL — ABNORMAL LOW (ref 8.9–10.3)
Creatinine, Ser: 0.61 mg/dL (ref 0.44–1.00)
GFR calc non Af Amer: >60 mL/min (ref >60)
GLUCOSE: 123 mg/dL — AB (ref 65–99)
POTASSIUM: 3.4 mmol/L — AB (ref 3.5–5.1)
Sodium: 140 mmol/L (ref 135–145)

## 2015-08-22 LAB — CBC
HEMATOCRIT: 27.8 % — AB (ref 36.0–46.0)
HEMOGLOBIN: 9.3 g/dL — AB (ref 12.0–15.0)
MCH: 30.9 pg (ref 26.0–34.0)
MCHC: 33.5 g/dL (ref 30.0–36.0)
MCV: 92.4 fL (ref 78.0–100.0)
Platelets: 246 10*3/uL (ref 150–400)
RBC: 3.01 MIL/uL — AB (ref 3.87–5.11)
RDW: 14.7 % (ref 11.5–15.5)
WBC: 10.2 10*3/uL (ref 4.0–10.5)

## 2015-08-22 MED ORDER — MOMETASONE FURO-FORMOTEROL FUM 100-5 MCG/ACT IN AERO: 2.0000 | INHALATION_SPRAY | Freq: Two times a day (BID) | RESPIRATORY_TRACT | Status: AC

## 2015-08-22 MED ORDER — MOMETASONE FURO-FORMOTEROL FUM 100-5 MCG/ACT IN AERO
2.0000 | INHALATION_SPRAY | Freq: Two times a day (BID) | RESPIRATORY_TRACT | Status: DC
  Filled 2015-08-22: qty 8.8

## 2015-08-22 MED ORDER — OXYCODONE-ACETAMINOPHEN 5-325 MG PO TABS: 1.0000 | ORAL_TABLET | Freq: Three times a day (TID) | ORAL | Status: DC | PRN

## 2015-08-22 MED ORDER — DIATRIZOATE MEGLUMINE & SODIUM 66-10 % PO SOLN
30.0000 mL | Freq: Once | ORAL | Status: DC
  Filled 2015-08-22: qty 30

## 2015-08-22 MED ORDER — DIATRIZOATE MEGLUMINE & SODIUM 66-10 % PO SOLN: 30.0000 mL | Freq: Once | ORAL | Status: DC

## 2015-08-22 MED ORDER — IOPAMIDOL (ISOVUE-300) INJECTION 61%
100.0000 mL | Freq: Once | INTRAVENOUS | Status: AC | PRN
  Administered 2015-08-22: 100 mL via INTRAVENOUS

## 2015-08-22 MED ORDER — DOCUSATE SODIUM 100 MG PO CAPS: 100.0000 mg | ORAL_CAPSULE | Freq: Two times a day (BID) | ORAL | Status: AC

## 2015-08-22 MED ORDER — DIATRIZOATE MEGLUMINE & SODIUM 66-10 % PO SOLN
15.0000 mL | ORAL | Status: DC | PRN
  Administered 2015-08-22: 15 mL via ORAL
  Filled 2015-08-22 (×2): qty 30

## 2015-08-22 MED ORDER — APIXABAN 2.5 MG PO TABS: 2.5000 mg | ORAL_TABLET | Freq: Two times a day (BID) | ORAL | Status: DC

## 2015-08-22 MED ORDER — POTASSIUM CHLORIDE CRYS ER 20 MEQ PO TBCR
40.0000 meq | EXTENDED_RELEASE_TABLET | Freq: Four times a day (QID) | ORAL | Status: DC
  Administered 2015-08-22: 40 meq via ORAL
  Filled 2015-08-22: qty 2

## 2015-08-22 NOTE — Progress Notes (Addendum)
LCSW made aware that patient is medically stable for discharge back to facility:  [name] with [name] and patient is able to to return. Sent all clinicals information to facility thought [facility]. Patient will be transferred by EMS. Patient alert and oriented and reports her daughter is aware as she and MD have called and spoken Patient aware of plan and in agreement.  No other needs at this time.  DC: SN[facility]  Lane Hacker, MSW Clinical Social Work: System [employer] [phone]

## 2015-08-22 NOTE — Discharge Summary (Addendum)
Physician Discharge Summary  [name] MPN:[id number] DOB: [birth year] DOA: 08/16/2015  PCP: Junie Panning, NP  Admit date: 08/16/2015 Discharge date: 08/22/2015  Time spent: 35 minutes  Recommendations for Outpatient Follow-up:  1. During this hospitalization she was found to have bilateral lung lesions concerning for bronchogenic cancer. She was seen by Dr Pennie Banter. Will be followed at [facility] in the outpatient setting 2. She will need outpatient follow up with Dr Ninfa Linden of orthopedic surgery 3. Has up coming appoint with Dr Melvyn Novas at [facility] Pulmonary medicine on 5/24/23017 4. She was discharged on Eliquis 2.5 mg PO BID x 10 days for DVT prophylaxis.    Discharge Diagnoses:  Principal Problem:   Hip fracture, right ([place]) Active Problems:   Hypothyroidism   COPD (chronic obstructive pulmonary disease) ([place])   Fall   Anemia   Insomnia   Discharge Condition: Stable  Diet recommendation: Regular diet  Filed Weights   08/17/15 0642  Weight: 49.6 kg (109 lb 5.6 oz)    History of present illness:  [name] is a 82 y.o. female with medical history significant of COPD, oxygen dependent on 2 L, anxiety, anemia, CAD, PVD, HTN, and carotid artery occlusion; who presents after falling on the pavement. She states that last night sometime around 8 or 9 PM she went out into her yard to bring her dog inside. She remembers that he had gotten tangled and ablation she bent down to grab his leash and fell on her right side hitting her head on the pavement. She notes inability to stand up following fall and complained of a throbbing pain on her right hip. Pain is a 9/10. Associated symptoms include some tingling sensation down the right leg. She reports some baseline shortness of breath for which she is on 2 L of nasal cannula oxygen. With the recent seasonal changes she's had some increased congestion. Denies having any urinary frequency, chest pain, fever, chills,  change in weight, or diarrhea. She initially feels that she would be outside all night but luckily her next or neighbors came home found her and picked her up and brought her in to her home before calling EMS. Patient live alone and was fairly independent. Denies ever having a broken bone before. Currently is on Plavix.  Hospital Course:  1. Suspected lung cancer. -A CT scan of lungs with IV contrast had been ordered to further workup her shortness of breath and assess for pulmonary embolism. -Unfortunately CT scan showing multiple pulmonary lesions bilaterally concerning for multifocal primary bronchogenic adenocarcinoma. -She has a history of secondhand exposure from her husband who was a heavy smoker. She had also smoked a self for a short period of time. -Spoke with Dr Pennie Banter who recommended further work up in the outpatient setting at which point [place] with biopsy could be dicussed.   2. Right hip fracture. -[name] is an 82 year old female who suffered a fall  landing on concrete. She had leaned over to pick up her dog's leash reporting losing her balance. Denies symptoms prior to event. -Imaging studies performed on admission revealed a right hip intertrochanteric fracture. -Requested consultation from [facility] orthopedics -On 08/17/2015 she was taken to the operating room or she underwent right hip intramedullary nail placement, procedure performed by Dr. Ninfa Linden of orthopedic surgery. She tolerated procedure well there no immediate complications. - Review lab work on 08/19/2015 showing a downward trend in her hemoglobin to 7.8. On this date she was typed and cross and transfuse  with a unit of packed red blood cells.  -On Repeat labs on 08/21/2015 show a hemoglobin of 9.7.  2. Acute respiratory failure/Volume overload -On 08/19/2015 she developed increasing shortness of breath going into respiratory distress after receiving a unit of packed red blood cells. She had required  placement of injury mask at 14L -On my evaluation on 08/21/2015 she continues to report nonexertional dyspnea. On 4 L she was satting 88-89 percent however nursing staff reporting that while asleep she had been satting in the mid 90s. I think there is an anxiety component however other considerations must be taken into account. On the chest x-ray report from 08/19/2015 the radiologist had mentioned fullness in the right hilar region favoring vascular and stated this could represent an acutely enlarged pulmonary artery associated with pulmonary embolism. This was further workup with CT. Did not show PE -As mentioned above CT scan showing evidence of multifocal bronchogenic cancer.   3. Leukocytosis. -Resolved.  4. Hypertension -Blood pressure stable, continue felodipine 10 mg by mouth daily  5. Pain management. -She was discharged on Percocet 5/325 1 tab PO q 8 hours PRN  Procedures: Right hip intramedullary nail placement performed on 08/17/2015  Consultations:  PCCM  Orthopedic Surgery  Discharge Exam: Filed Vitals:   08/21/15 2155 08/22/15 0515  BP: 120/45 138/55  Pulse: 97 80  Temp: 98.6 F (37 C) 98.4 F (36.9 C)  Resp: 20 16    General exam: She looks better today Respiratory system: Clear to auscultation. Respiratory effort normal. Cardiovascular system: S1 & S2 heard, RRR. No JVD, murmurs, rubs, gallops or clicks. No pedal edema. Gastrointestinal system: Abdomen is nondistended, soft and nontender. No organomegaly or masses felt. Normal bowel sounds heard. Central nervous system: Alert and oriented. No focal neurological deficits. Extremities: surgical incision site appears clean Skin: No rashes, lesions or ulcers Psychiatry: Judgement and insight appear normal. Mood & affect appropriate.   Discharge Instructions   Discharge Instructions    Call MD for:  difficulty breathing, headache or visual disturbances    Complete by:  As directed      Call MD for:   extreme fatigue    Complete by:  As directed      Call MD for:  hives    Complete by:  As directed      Call MD for:  persistant dizziness or light-headedness    Complete by:  As directed      Call MD for:  persistant nausea and vomiting    Complete by:  As directed      Call MD for:  redness, tenderness, or signs of infection (pain, swelling, redness, odor or green/yellow discharge around incision site)    Complete by:  As directed      Call MD for:  severe uncontrolled pain    Complete by:  As directed      Call MD for:  temperature >100.4    Complete by:  As directed      Call MD for:    Complete by:  As directed      Diet - low sodium heart healthy    Complete by:  As directed      Full weight bearing    Complete by:  As directed      Increase activity slowly    Complete by:  As directed           Current Discharge Medication List    START taking these medications   Details  apixaban (ELIQUIS) 2.5 MG TABS tablet Take 1 tablet (2.5 mg total) by mouth 2 (two) times daily. Qty: 20 tablet, Refills: 0    docusate sodium (COLACE) 100 MG capsule Take 1 capsule (100 mg total) by mouth 2 (two) times daily. Qty: 10 capsule, Refills: 0    mometasone-formoterol (DULERA) 100-5 MCG/ACT AERO Inhale 2 puffs into the lungs 2 (two) times daily. Qty: 1 Inhaler, Refills: 1      CONTINUE these medications which have CHANGED   Details  oxyCODONE-acetaminophen (PERCOCET/ROXICET) 5-325 MG tablet Take 1 tablet by mouth every 8 (eight) hours as needed. Qty: 15 tablet, Refills: 0      CONTINUE these medications which have NOT CHANGED   Details  busPIRone (BUSPAR) 7.5 MG tablet Take 7.5 mg by mouth 2 (two) times daily. Refills: 0    citalopram (CELEXA) 20 MG tablet Take 20 mg by mouth daily.    feeding supplement, ENSURE ENLIVE, (ENSURE ENLIVE) LIQD Take 237 mLs by mouth 3 (three) times daily between meals. Qty: 237 mL, Refills: 12    felodipine (PLENDIL) 5 MG 24 hr tablet Take 10 mg  by mouth daily.     hydrOXYzine (ATARAX/VISTARIL) 25 MG tablet Take 25-50 mg by mouth daily as needed (sleep).     INCRUSE ELLIPTA 62.5 MCG/INH AEPB Inhale 1 puff into the lungs daily. Refills: 2    ipratropium-albuterol (DUONEB) 0.5-2.5 (3) MG/3ML SOLN Take 3 mLs by nebulization every 6 (six) hours as needed (shortness of breath).  Refills: 0    levothyroxine (SYNTHROID, LEVOTHROID) 88 MCG tablet Take 88 mcg by mouth daily. Refills: 0    nitroGLYCERIN (NITROSTAT) 0.4 MG SL tablet Place 0.4 mg under the tongue every 5 (five) minutes as needed for chest pain.    omeprazole (PRILOSEC) 20 MG capsule Take 1 capsule (20 mg total) by mouth daily. Qty: 30 capsule, Refills: 2    potassium chloride (MICRO-K) 10 MEQ CR capsule Take 10 mEq by mouth daily.    tiotropium (SPIRIVA) 18 MCG inhalation capsule Place 1 capsule (18 mcg total) into inhaler and inhale daily. Qty: 30 capsule, Refills: 0    zolpidem (AMBIEN) 5 MG tablet Take 1 tablet (5 mg total) by mouth at bedtime as needed for sleep. Qty: 30 tablet, Refills: 0      STOP taking these medications     budesonide-formoterol (SYMBICORT) 160-4.5 MCG/ACT inhaler      clopidogrel (PLAVIX) 75 MG tablet      [employer] SENNA PLUS 8.6-50 MG tablet        Allergies  Allergen Reactions  . Aspirin     GI  Bleed  . Penicillins     Numbness around mouth  Has patient had a PCN reaction causing immediate rash, facial/tongue/throat swelling, SOB or lightheadedness with hypotension: Yes Has patient had a PCN reaction causing severe rash involving mucus membranes or skin necrosis: No Has patient had a PCN reaction that required hospitalization No Has patient had a PCN reaction occurring within the last 10 years: No If all of the above answers are "NO", then may proceed with Cephalosporin use.    . Biaxin [Clarithromycin] Nausea Only  . Sulfonamide Derivatives Nausea And Vomiting  . Buprenorphine Nausea And Vomiting  . Codeine Nausea And  Vomiting  . Hydrocodone Nausea And Vomiting  . Morphine And Related Nausea Only    Nausea   . Sulfa Antibiotics Nausea And Vomiting  . Xanax [Alprazolam]     unknown   Follow-up Information  Follow up with Mcarthur Rossetti, MD. Schedule an appointment as soon as possible for a visit in 2 weeks.   Specialty:  Orthopedic Surgery   Contact information:   [place] [address] [phone]       Follow up with [facility].   Specialty:  Skilled Nursing Facility   Contact information:   [address] [place] [phone]      Follow up with Smothers, Andree Elk, NP In 2 weeks.   Specialty:  Nurse Practitioner   Contact information:   [address] [place] [address] [phone]       Follow up with Simonne Maffucci, MD In 1 week.   Specialty:  Pulmonary Disease   Contact information:   [place] [address] [phone]        The results of significant diagnostics from this hospitalization (including imaging, microbiology, ancillary and laboratory) are listed Crit[facility] for reference.    Significant Diagnostic Studies: Dg Chest 1 View  08/19/2015  CLINICAL DATA:  82 year old female with shortness of breath. Right hip fracture. EXAM: CHEST 1 VIEW COMPARISON:  Chest x-ray 08/17/2015. FINDINGS: Mild emphysematous changes are noted throughout the lungs bilaterally. No acute consolidative airspace disease. No pleural effusions. No evidence of pulmonary edema. Heart size is normal. New fullness in the right hilar region. Atherosclerosis in the thoracic aorta. IMPRESSION: 1. New fullness in the right hilar region. This is favored to be vascular, and could represent an acutely enlarged pulmonary artery (e.g., "Fleischner sign" associated with pulmonary embolism). Given the patient's history, clinical correlation for signs and symptoms of pulmonary embolism is suggested, with consideration for  further evaluation with PE protocol CT scan if clinically appropriate. 2. Atherosclerosis. These results will be called to the ordering clinician or representative by the Radiologist Assistant, and communication documented in the PACS or zVision Dashboard. Electronically Signed   By: Vinnie Langton M.D.   On: 08/19/2015 19:57   Dg Chest 2 View  08/17/2015  CLINICAL DATA:  Dyspnea.  Hip fracture. EXAM: CHEST  2 VIEW COMPARISON:  None. FINDINGS: A single supine AP view of the chest is negative for pneumothorax or large effusion. Mediastinal contours are normal. The lungs are clear. The pulmonary vasculature is normal. IMPRESSION: No acute cardiopulmonary findings. Electronically Signed   By: Andreas Newport M.D.   On: 08/17/2015 01:17   Ct Head Wo Contrast  08/17/2015  CLINICAL DATA:  [place] 3 hours ago. EXAM: CT HEAD WITHOUT CONTRAST TECHNIQUE: Contiguous axial images were obtained from the base of the skull through the vertex without intravenous contrast. COMPARISON:  11/03/2013 FINDINGS: There is no intracranial hemorrhage, mass or evidence of acute infarction. There is moderate generalized atrophy. There is moderate chronic microvascular ischemic change. There is remote lacunar infarction in the right caudate and right putamen. There is no significant extra-axial fluid collection. No acute intracranial findings are evident. The calvarium and skullbase are intact. The visible paranasal sinuses and orbits are unremarkable. IMPRESSION: No acute intracranial findings. There is moderate generalized atrophy, remote lacunar infarctions, and chronic appearing white matter hypodensities which likely represent small vessel ischemic disease. Electronically Signed   By: Andreas Newport M.D.   On: 08/17/2015 00:52   Ct Angio Chest Pe W/cm &/or Wo Cm  08/21/2015  CLINICAL DATA:  82 year old female with history of shortness of breath and clinical concern for potential pulmonary embolism. History of recent fall with  right intertrochanteric hip fracture  status post ORIF on 08/17/2015. EXAM: CT ANGIOGRAPHY CHEST WITH CONTRAST TECHNIQUE: Multidetector CT imaging of the chest was performed using the standard protocol during bolus administration of intravenous contrast. Multiplanar CT image reconstructions and MIPs were obtained to evaluate the vascular anatomy. CONTRAST:  100 mL of Isovue 370. COMPARISON:  Chest CT 11/10/2008. FINDINGS: Mediastinum/Lymph Nodes: Study is slightly limited by patient respiratory motion. Despite this limitation, there is no central, lobar or segmental sized filling defect within the pulmonary arteries to suggest clinically relevant pulmonary embolism. Smaller distal subsegmental sized pulmonary embolism cannot be entirely excluded. Heart size is borderline enlarged. Small amount of pericardial fluid and/or thickening, unlikely to be of any hemodynamic significance at this time. No associated pericardial calcification. There is atherosclerosis of the thoracic aorta, the great vessels of the mediastinum and the coronary arteries, including calcified atherosclerotic plaque in the left main, left anterior descending and right coronary arteries. Mild calcifications of the aortic and mitral valve sparing No pathologically enlarged mediastinal or hilar lymph nodes. Esophagus is unremarkable in appearance. No axillary lymphadenopathy. Lungs/Pleura: Small bilateral pleural effusions lying dependently (right greater than left) with some associated passive subsegmental atelectasis in the lower lobes of the lungs bilaterally. Importantly, the previously noted nodular areas of ground-glass attenuation and architectural distortion in the upper lobes the lungs bilaterally have increased in size compared to prior study 11/10/2008. The lesion in the left apex currently measures 2.9 x 1.5 cm (image 19 of series 7) previously 2.1 x 0.8 cm on 11/10/2008, and appears as a more dense area of ground-glass attenuation, septal  thickening and demonstrates some internal bronchiolectasis, concerning potential adenocarcinoma. The lesion in the right apex which was previously a 1.3 cm pure ground-glass attenuation nodule currently measures 1.8 x 2.6 cm (image 25 of series 7) and has a central 8 mm solid component (image 17 of series 4), also concerning for potential adenocarcinoma. As well, there is a new peripheral ground-glass attenuation lesion measuring 2.8 x 1.5 cm in the right upper lobe (image 50 of series 7) which also has a central 12 mm solid component (image 34 of series 4), concerning for additional focus of disease. A few other scattered ill-defined areas of ground-glass attenuation are also noted. Mild diffuse bronchial wall thickening. Partial collapse of lower lobe bronchi during expiration, indicative of bronchomalacia. Upper Abdomen: Calcified granuloma in the periphery of segment 8 of the liver. Atherosclerosis. Musculoskeletal/Soft Tissues: There are no aggressive appearing lytic or blastic lesions noted in the visualized portions of the skeleton. Review of the MIP images confirms the above findings. IMPRESSION: 1. Despite the mild limitations of today's examination there is no evidence to suggest clinically relevant central, lobar or segmental sized pulmonary embolism. 2. However, there are multiple pulmonary lesions bilaterally which have the appearance concerning for probable multifocal primary bronchogenic adenocarcinoma. While one of these lesions in the right upper lobe is new, the other 2 lesions in the apices of the upper lobes of the lungs bilaterally have clearly enlarged compared to remote prior study 11/10/2008. Further evaluation with nonemergent PET-CT is recommended in the near future for diagnostic and staging purposes. 3. Small bilateral pleural effusions (right greater than left) lying dependently with minimal passive subsegmental atelectasis in the lower lobes of the lungs bilaterally. 4. Evidence of  bronchomalacia, as above. 5. Small amount of pericardial fluid and/or thickening, unlikely to be of hemodynamic significance at this time. 6. Atherosclerosis, including left main and 2 vessel coronary artery disease. These results will be called to  the ordering clinician or representative by the Radiologist Assistant, and communication documented in the PACS or zVision Dashboard. Electronically Signed   By: Vinnie Langton M.D.   On: 08/21/2015 11:28   Dg C-arm 1-60 Min-no Report  08/17/2015  CLINICAL DATA: right hip IM nail C-ARM 1-60 MINUTES Fluoroscopy was utilized by the requesting physician.  No radiographic interpretation.   Dg Hip Operative Unilat With Pelvis Right  08/17/2015  CLINICAL DATA:  RIGHT intra medullary nail fixation of intertrochanteric fracture EXAM: OPERATIVE RIGHT HIP (WITH PELVIS IF PERFORMED)  VIEWS TECHNIQUE: Fluoroscopic spot image(s) were submitted for interpretation post-operatively. COMPARISON:  08/16/2015 FINDINGS: Intra medullary nail fixation of intertrochanteric fracture. Two compression screws noted. Distal nail normal. IMPRESSION: No complication following fine IM nail fixation of RIGHT intertrochanteric fracture. Electronically Signed   By: Suzy Bouchard M.D.   On: 08/17/2015 19:48   Dg Hip Unilat  With Pelvis 2-3 Views Right  08/16/2015  CLINICAL DATA:  [place] while walking her dog at 20:00. EXAM: DG HIP (WITH OR WITHOUT PELVIS) 2-3V RIGHT COMPARISON:  None. FINDINGS: There is an intertrochanteric right hip fracture, well aligned. No dislocation. Moderate right hip arthritis. Left hip is moderately arthritic as well. IMPRESSION: Intertrochanteric right hip fracture. Electronically Signed   By: Andreas Newport M.D.   On: 08/16/2015 23:59    Microbiology: Recent Results (from the past 240 hour(s))  Surgical pcr screen     Status: None   Collection Time: 08/17/15 11:09 AM  Result Value Ref Range Status   MRSA, PCR NEGATIVE NEGATIVE Final   Staphylococcus aureus  NEGATIVE NEGATIVE Final    Comment:        The Xpert SA Assay (FDA approved for NASAL specimens in patients over 49 years of age), is one component of a comprehensive surveillance program.  Test performance has been validated by [facility] for patients greater than or equal to 36 year old. It is not intended to diagnose infection nor to guide or monitor treatment.      Labs: Basic Metabolic Panel:  Recent Labs Lab 08/17/15 0109 08/18/15 0430 08/19/15 0408 08/20/15 0402 08/22/15 0359  NA 145 142 144 144 140  K 4.0 4.0 3.7 3.2* 3.4*  CL 109 107 108 107 107  CO2 '26 24 26 28 28  '$ GLUCOSE 171* 159* 129* 139* 123*  BUN 19 18 23* 16 16  CREATININE 0.64 0.79 0.76 0.55 0.61  CALCIUM 9.5 8.4* 8.4* 8.6* 8.4*   Liver Function Tests: No results for input(s): AST, ALT, ALKPHOS, BILITOT, PROT, ALBUMIN in the last 168 hours. No results for input(s): LIPASE, AMYLASE in the last 168 hours. No results for input(s): AMMONIA in the last 168 hours. CBC:  Recent Labs Lab 08/17/15 0109 08/18/15 0430 08/19/15 0408 08/20/15 0402 08/21/15 0917 08/22/15 0359  WBC 15.1* 10.3 12.2* 11.5* 10.4 10.2  NEUTROABS 14.2*  --   --   --   --   --   HGB 11.2* 8.8* 7.8* 9.5* 9.7* 9.3*  HCT 34.1* 27.1* 23.5* 28.4* 28.7* 27.8*  MCV 91.4 92.8 92.9 91.6 89.7 92.4  PLT 308 223 207 216 237 246   Cardiac Enzymes:  Recent Labs Lab 08/17/15 0404  TROPONINI <0.03   BNP: BNP (last 3 results)  Recent Labs  05/31/15 0553  BNP 255.9*    ProBNP (last 3 results) No results for input(s): PROBNP in the last 8760 hours.  CBG: No results for input(s): GLUCAP in the last 168 hours.     Signed:  Kelvin Cellar MD.  [facility] 08/22/2015, 12:20 PM

## 2015-08-22 NOTE — Progress Notes (Signed)
Report called to Festus at [facility].

## 2015-08-22 NOTE — Clinical Social Work Placement (Signed)
   CLINICAL SOCIAL WORK PLACEMENT  NOTE  Date:  08/22/2015  Patient Details  Name: [name] MRN: [id number] Date of Birth: 10/03/1930  Clinical Social Work is seeking post-discharge placement for this patient at the [place] level of care (*CSW will initial, date and re-position this form in  chart as items are completed):  Yes   Patient/family provided with [place] Work Department's list of facilities offering this level of care within the geographic area requested by the patient (or if unable, by the patient's family).  Yes   Patient/family informed of their freedom to choose among providers that offer the needed level of care, that participate in Medicare, Medicaid or managed care program needed by the patient, have an available bed and are willing to accept the patient.  Yes   Patient/family informed of [facility]'s ownership interest in [facility] and [facility], as well as of the fact that they are under no obligation to receive care at these facilities.  PASRR submitted to EDS on 08/22/15     PASRR number received on 08/22/15     Existing PASRR number confirmed on 08/22/15     FL2 transmitted to all facilities in geographic area requested by pt/family on       FL2 transmitted to all facilities within larger geographic area on       Patient informed that his/her managed care company has contracts with or will negotiate with certain facilities, including the following:        Yes   Patient/family informed of bed offers received.  Patient chooses bed at [facility] and [facility] [place] recommends and patient chooses bed at [facility]    Patient to be transferred to [facility] on 08/22/15.  Patient to be transferred to facility by [facility]     Patient family notified on 08/22/15 of transfer.  Name of family member notified:  Daughter     PHYSICIAN Please sign FL2, Please  sign DNR, Please prepare prescriptions     Additional Comment:    _______________________________________________ Lilly [place], LCSW 08/22/2015, 12:28 PM

## 2015-08-22 NOTE — Consult Note (Signed)
PULMONARY / CRITICAL CARE MEDICINE   Name: [name] MRN: [id number] DOB: 13-Oct-1930    ADMISSION DATE:  08/16/2015 CONSULTATION DATE:  08/22/2015  REFERRING MD:  Coralyn Pear  CHIEF COMPLAINT:  Abnormal CT chest  HISTORY OF PRESENT ILLNESS:   82 y/o female with a past medical history of COPD (FEV 800cc 41% predicted in 2010) on home oxygen who smoked < 1 ppd for 15 years and quit around age 7 who was admitted to [facility] long hospital on 08/16/2015 with a right hip fracture. Postoperatively she required a blood transfusion and developed dyspnea so a CT scan of her chest was performed which showed no evidence of a pulmonary embolism but findings that were worrisome for slow growing bronchogenic carcinoma.  She tells me that even though she "barely smoked" for 15 years her husband was a heavy smoker. She was diagnosed with COPD and apparently was seen by Dr. Danton Sewer around 2010 that she does not recall this. She has been on oxygen for a number of years at night only, though she can't recall how long she's been on it. She tells me that she takes Spiriva at home and she may have taken Symbicort at some point.  In the last several months she's been having increasing shortness of breath. She has a daily cough but no hemoptysis or chest pain. Her daughter collaborates this and says that her breathing has become "really bad". She has not been seen in pulmonary clinic for over 7 years. She is now recovering from her hip surgery.  PAST MEDICAL HISTORY :  She  has a past medical history of COPD (chronic obstructive pulmonary disease) ([place]); Carotid artery occlusion; Arthritis; Anxiety; Myocardial infarction ([place]) (1987); Anemia; Bilateral leg pain; Femoral bruit; Bronchitis, chronic ([place]); Fall (April 2014); Personal history of other diseases of digestive system; Generalized anxiety disorder; Nonspecific abnormal electrocardiogram (ECG) (EKG); Memory loss; Intestinal infection due to Clostridium  difficile; Colitis due to Clostridium difficile; Hypertension; CAD (coronary artery disease); and Thyroid disease.  PAST SURGICAL HISTORY: She  has past surgical history that includes Carotid endarterectomy (12/30/2008); abdominal aortagram (N/A, 09/19/2012); lower extremity angiogram (Bilateral, 09/19/2012); and Femur IM nail (Right, 08/17/2015).  Allergies  Allergen Reactions  . Aspirin     GI  Bleed  . Penicillins     Numbness around mouth  Has patient had a PCN reaction causing immediate rash, facial/tongue/throat swelling, SOB or lightheadedness with hypotension: Yes Has patient had a PCN reaction causing severe rash involving mucus membranes or skin necrosis: No Has patient had a PCN reaction that required hospitalization No Has patient had a PCN reaction occurring within the last 10 years: No If all of the above answers are "NO", then may proceed with Cephalosporin use.    . Biaxin [Clarithromycin] Nausea Only  . Sulfonamide Derivatives Nausea And Vomiting  . Buprenorphine Nausea And Vomiting  . Codeine Nausea And Vomiting  . Hydrocodone Nausea And Vomiting  . Morphine And Related Nausea Only    Nausea   . Sulfa Antibiotics Nausea And Vomiting  . Xanax [Alprazolam]     unknown    No current facility-administered medications on file prior to encounter.   Current Outpatient Prescriptions on File Prior to Encounter  Medication Sig  . budesonide-formoterol (SYMBICORT) 160-4.5 MCG/ACT inhaler Inhale 2 puffs into the lungs 2 (two) times daily as needed (for shortness of breath).   . citalopram (CELEXA) 20 MG tablet Take 20 mg by mouth daily.  . feeding supplement, ENSURE ENLIVE, (ENSURE  ENLIVE) LIQD Take 237 mLs by mouth 3 (three) times daily between meals.  . felodipine (PLENDIL) 5 MG 24 hr tablet Take 10 mg by mouth daily.   . hydrOXYzine (ATARAX/VISTARIL) 25 MG tablet Take 25-50 mg by mouth daily as needed (sleep).   Marland Kitchen ipratropium-albuterol (DUONEB) 0.5-2.5 (3) MG/3ML SOLN  Take 3 mLs by nebulization every 6 (six) hours as needed (shortness of breath).   . nitroGLYCERIN (NITROSTAT) 0.4 MG SL tablet Place 0.4 mg under the tongue every 5 (five) minutes as needed for chest pain.  Marland Kitchen omeprazole (PRILOSEC) 20 MG capsule Take 1 capsule (20 mg total) by mouth daily. (Patient taking differently: Take 20 mg by mouth daily as needed (acid reflux). )  . potassium chloride (MICRO-K) 10 MEQ CR capsule Take 10 mEq by mouth daily.  Marland Kitchen tiotropium (SPIRIVA) 18 MCG inhalation capsule Place 1 capsule (18 mcg total) into inhaler and inhale daily.  Marland Kitchen zolpidem (AMBIEN) 5 MG tablet Take 1 tablet (5 mg total) by mouth at bedtime as needed for sleep.  Marland Kitchen levothyroxine (SYNTHROID, LEVOTHROID) 100 MCG tablet TAKE 1 TABLET BY MOUTH EVERY MORNING BEFORE BREAKFAST (Patient not taking: Reported on 08/17/2015)    FAMILY HISTORY:  Her indicated that her mother is deceased. She indicated that her father is deceased.   SOCIAL HISTORY: She  reports that she quit smoking about 31 years ago. Her smoking use included Cigarettes. She quit after 15 years of use. She has never used smokeless tobacco. She reports that she does not drink alcohol or use illicit drugs.  REVIEW OF SYSTEMS:   Gen: Denies fever, chills, weight change, + fatigue, night sweats HEENT: Denies blurred vision, double vision, hearing loss, tinnitus, sinus congestion, rhinorrhea, sore throat, neck stiffness, dysphagia PULM: per HPI CV: Denies chest pain, edema, orthopnea, paroxysmal nocturnal dyspnea, palpitations GI: Denies abdominal pain, nausea, vomiting, diarrhea, hematochezia, melena, constipation, change in bowel habits GU: Denies dysuria, hematuria, polyuria, oliguria, urethral discharge Endocrine: Denies hot or cold intolerance, polyuria, polyphagia or appetite change Derm: Denies rash, dry skin, scaling or peeling skin change Heme: Denies easy bruising, bleeding, bleeding gums Neuro: Denies headache, numbness, weakness, slurred  speech, loss of memory or consciousness   SUBJECTIVE:  As above  VITAL SIGNS: BP 138/55 mmHg  Pulse 80  Temp(Src) 98.4 F (36.9 C) (Oral)  Resp 16  Ht '5\' 3"'$  (1.6 m)  Wt 109 lb 5.6 oz (49.6 kg)  BMI 19.38 kg/m2  SpO2 94%  HEMODYNAMICS:    VENTILATOR SETTINGS:    INTAKE / OUTPUT: I/O last 3 completed shifts: In: 540 [P.O.:540] Out: 1075 [Urine:1075]  PHYSICAL EXAMINATION: General:  Frail, elderly female, in bed Neuro:  Awake, alert, has difficulty with some historical details but otherwise oriented to situation and conversant, moves all 4 extremities well HEENT:  Normocephalic atraumatic, extraocular movements intact, sclera anicteric, neck supple without lymphadenopathy palpable Cardiovascular:  Regular rate and rhythm, no clear murmurs gallops rubs, no JVD Lungs:  Clear to auscultation anteriorly and laterally, minimal scant wheezes in the bases bilaterally with normal respiratory effort and normal air movement Abdomen:  Bowel sounds positive, nontender nondistended Musculoskeletal:  Status post right hip internal medullary nailing Skin:  No rash or skin breakdown  LABS:  BMET  Recent Labs Lab 08/19/15 0408 08/20/15 0402 08/22/15 0359  NA 144 144 140  K 3.7 3.2* 3.4*  CL 108 107 107  CO2 '26 28 28  '$ BUN 23* 16 16  CREATININE 0.76 0.55 0.61  GLUCOSE 129* 139* 123*  Electrolytes  Recent Labs Lab 08/19/15 0408 08/20/15 0402 08/22/15 0359  CALCIUM 8.4* 8.6* 8.4*    CBC  Recent Labs Lab 08/20/15 0402 08/21/15 0917 08/22/15 0359  WBC 11.5* 10.4 10.2  HGB 9.5* 9.7* 9.3*  HCT 28.4* 28.7* 27.8*  PLT 216 237 246    Coag's  Recent Labs Lab 08/17/15 0109  INR 1.05    Sepsis Markers No results for input(s): LATICACIDVEN, PROCALCITON, O2SATVEN in the last 168 hours.  ABG No results for input(s): PHART, PCO2ART, PO2ART in the last 168 hours.  Liver Enzymes No results for input(s): AST, ALT, ALKPHOS, BILITOT, ALBUMIN in the last 168  hours.  Cardiac Enzymes  Recent Labs Lab 08/17/15 0404  TROPONINI <0.03    Glucose No results for input(s): GLUCAP in the last 168 hours.  Imaging No results found.   STUDIES:  08/21/2015 CT chest images personally reviewed: There is no pulmonary embolism, there is mild centrilobular emphysema bilaterally, there are 3 separate groundglass pulmonary nodules, one in the left upper lobe, 2 in the right upper lobe. The one in the left upper lobe has slightly increased from about 2.0 cm in 2010 to 2.9 cm now. The right upper lobe lesions have grown slightly more than this and one is now approximately 2.9 cm which is up from 1.3 cm in 2010.   ANTIBIOTICS: 5/10 clindamycin x1 dose  SIGNIFICANT EVENTS: 5/9 admission 5/10 R him IM nail   DISCUSSION: This is an 82 year old female with severe COPD who is in a smoker he was found to have multiple pulmonary nodules on a CT scan this weekend. These nodules are worrisome for slow growing adenocarcinoma. Ultimately in order to obtain a diagnosis she would need a biopsy, but this would involve navigational bronchoscopy in the operating room and at this time she needs to focus on recuperating from her right hip fracture prior to undergoing further general anesthesia. In terms of her COPD, she had severe disease in 2010 and based on her symptoms this seems to have progressed since then. I had a frank conversation with the patient and her daughter today. While at this point I think it's reasonable to consider working this up and pursuing a biopsy, considering the slow growth over time, her overall frail state, and significant symptomatic burden from COPD the best approach may be to with hold on a biopsy. Deciding about this would be best when she is in better spirits and stronger after recuperating from her hip fracture.  ASSESSMENT / PLAN:  PULMONARY A: Severe COPD, not in exacerbation Chronic respiratory failure with hypoxemia Multiple  pulmonary nodules worrisome for slow growing adenocarcinoma P:   Continue Spiriva At discharge discontinue Incruse,  Add Dulera 100/5 twice a day > transition back to Symbicort as outpatient Hold brovana and pulmicort Titrate supplemental O2 to maintain an O2 saturation greater than 90% Continue as needed albuterol Plan for a PET/CT as an outpatient, we will order as an outpatient when she is seen in clinic by Dr. Melvyn Novas on 5/24 Follow-up in pulmonary clinic prior to any further decision-making or plans for biopsy  Pulmonary and critical care medicine will be available as needed, agree with primary team approach for seeking inpatient rehabilitation facility for her at this time.   Roselie Awkward, MD [place] [facility] Pager: [phone] Cell: [phone] After 3pm or if no response, call [phone]   08/22/2015, 10:58 AM

## 2015-08-22 NOTE — Care Management Important Message (Signed)
Important Message  Patient Details  Name: [name] MRN: [id number] Date of Birth: [birth year]   Medicare Important Message Given:  Yes    Camillo Flaming 08/22/2015, 11:54 AMImportant Message  Patient Details  Name: [name] MRN: [id number] Date of Birth: 1930-07-29   Medicare Important Message Given:  Yes    Camillo Flaming 08/22/2015, 11:54 AM

## 2015-08-23 ENCOUNTER — Non-Acute Institutional Stay (SKILLED_NURSING_FACILITY): Payer: Medicare Other | Admitting: Internal Medicine

## 2015-08-23 ENCOUNTER — Encounter: Payer: Self-pay | Admitting: Internal Medicine

## 2015-08-23 DIAGNOSIS — D62 Acute posthemorrhagic anemia: Secondary | ICD-10-CM

## 2015-08-23 DIAGNOSIS — C3490 Malignant neoplasm of unspecified part of unspecified bronchus or lung: Secondary | ICD-10-CM

## 2015-08-23 DIAGNOSIS — J9621 Acute and chronic respiratory failure with hypoxia: Secondary | ICD-10-CM

## 2015-08-23 DIAGNOSIS — F419 Anxiety disorder, unspecified: Secondary | ICD-10-CM | POA: Diagnosis not present

## 2015-08-23 DIAGNOSIS — I119 Hypertensive heart disease without heart failure: Secondary | ICD-10-CM | POA: Diagnosis not present

## 2015-08-23 DIAGNOSIS — S72001D Fracture of unspecified part of neck of right femur, subsequent encounter for closed fracture with routine healing: Secondary | ICD-10-CM | POA: Diagnosis not present

## 2015-08-23 DIAGNOSIS — J438 Other emphysema: Secondary | ICD-10-CM

## 2015-08-23 DIAGNOSIS — Z8719 Personal history of other diseases of the digestive system: Secondary | ICD-10-CM

## 2015-08-23 NOTE — Progress Notes (Signed)
MRN: [id number] Name: [name]  Sex: female Age: 82 y.o. DOB: July 31, 1930  [place] #: [name] Facility/Room: 511 P Level Of Care: SNF Provider: Noah Delaine. [facility] Contacts: Extended Emergency Contact Information Primary Emergency Contact: [name] [address] [address] [place]itter of [place] Phone: [phone] Work Phone: [phone] Mobile Phone: [phone] Relation: Daughter Secondary Emergency Contact: [name] [place] of [place] Phone: [phone] Relation: Neighbor  Code Status: DNR  Allergies: Aspirin; Penicillins; Biaxin; Sulfonamide derivatives; Buprenorphine; Codeine; Hydrocodone; Morphine and related; Sulfa antibiotics; and Xanax  Chief Complaint  Patient presents with  . New Admit To SNF    Admission to Facility    HPI: Patient is 82 y.o. female with medical history significant of COPD, oxygen dependent on 2 L, anxiety, anemia, CAD, PVD, HTN, and carotid artery occlusion; who presents after falling on the pavement. Pt was admitted to [facility] from 5/9-15 where she was dx with R intertrochanteric hip fx and underwent intramedullaty nailing. A CT scan ordered for w/u of acute on chronic respiratory failure revealed multiple lesions c/w primary bronchogenic CA which will be f/u as an outpt. Pt is admitted to SNF for generalized weakness for OT/PT. While at SNF pt will be followed for HTN, tx with feldine, anxiety tx with buspar and and h/o GI bleed, tx with omeprazole.  Past Medical History  Diagnosis Date  . COPD (chronic obstructive pulmonary disease) ([place])   . Carotid artery occlusion   . Arthritis   . Anxiety     Afraid , living alone  . Myocardial infarction ([place]) 1987  . Anemia   . Bilateral leg pain     for years  . Femoral bruit   . Bronchitis, chronic ([place])   . Fall April 2014    Pt. has fallen twife in the last 2 months.   . Personal history of other diseases of digestive system     Upper GI bleed  .  Generalized anxiety disorder   . Nonspecific abnormal electrocardiogram (ECG) (EKG)     Abnormal Results EKG  . Memory loss     Worsening  . Intestinal infection due to Clostridium difficile   . Colitis due to Clostridium difficile   . Hypertension   . CAD (coronary artery disease)   . Thyroid disease     Hypo-Thyroidism    Past Surgical History  Procedure Laterality Date  . Carotid endarterectomy  12/30/2008  . Abdominal aortagram N/A 09/19/2012    Procedure: ABDOMINAL Maxcine Ham;  Surgeon: Elam Dutch, MD;  Location: [facility] CATH LAB;  Service: Cardiovascular;  Laterality: N/A;  . Lower extremity angiogram Bilateral 09/19/2012    Procedure: LOWER EXTREMITY ANGIOGRAM;  Surgeon: Elam Dutch, MD;  Location: [facility] CATH LAB;  Service: Cardiovascular;  Laterality: Bilateral;  . Femur im nail Right 08/17/2015    Procedure: RIGHT HIP INTRAMEDULLARY (IM) NAIL FEMORAL;  Surgeon: Mcarthur Rossetti, MD;  Location: [facility] ORS;  Service: Orthopedics;  Laterality: Right;      Medication List       This list is accurate as of: 08/23/15 11:59 PM.  Always use your most recent med list.               apixaban 2.5 MG Tabs tablet  Commonly known as:  ELIQUIS  Take 1 tablet (2.5 mg total) by mouth 2 (two) times daily.     busPIRone 7.5 MG tablet  Commonly known as:  BUSPAR  Take  7.5 mg by mouth 2 (two) times daily.     citalopram 20 MG tablet  Commonly known as:  CELEXA  Take 20 mg by mouth daily.     docusate sodium 100 MG capsule  Commonly known as:  COLACE  Take 1 capsule (100 mg total) by mouth 2 (two) times daily.     feeding supplement (ENSURE ENLIVE) Liqd  Take 237 mLs by mouth 3 (three) times daily between meals.     felodipine 5 MG 24 hr tablet  Commonly known as:  PLENDIL  Take 10 mg by mouth daily.     hydrOXYzine 25 MG tablet  Commonly known as:  ATARAX/VISTARIL  Take 25-50 mg by mouth daily as needed (sleep).     INCRUSE ELLIPTA 62.5 MCG/INH Aepb  Generic drug:   umeclidinium bromide  Inhale 1 puff into the lungs daily.     ipratropium-albuterol 0.5-2.5 (3) MG/3ML Soln  Commonly known as:  DUONEB  Take 3 mLs by nebulization every 6 (six) hours as needed (shortness of breath).     levothyroxine 88 MCG tablet  Commonly known as:  SYNTHROID, LEVOTHROID  Take 88 mcg by mouth daily.     mometasone-formoterol 100-5 MCG/ACT Aero  Commonly known as:  DULERA  Inhale 2 puffs into the lungs 2 (two) times daily.     nitroGLYCERIN 0.4 MG SL tablet  Commonly known as:  NITROSTAT  Place 0.4 mg under the tongue every 5 (five) minutes as needed for chest pain.     omeprazole 20 MG capsule  Commonly known as:  PRILOSEC  Take 1 capsule (20 mg total) by mouth daily.     oxyCODONE-acetaminophen 5-325 MG tablet  Commonly known as:  PERCOCET/ROXICET  Take 1 tablet by mouth every 8 (eight) hours as needed.     potassium chloride 10 MEQ CR capsule  Commonly known as:  MICRO-K  Take 10 mEq by mouth daily.     tiotropium 18 MCG inhalation capsule  Commonly known as:  SPIRIVA  Place 1 capsule (18 mcg total) into inhaler and inhale daily.     zolpidem 5 MG tablet  Commonly known as:  AMBIEN  Take 1 tablet (5 mg total) by mouth at bedtime as needed for sleep.        No orders of the defined types were placed in this encounter.    Immunization History  Administered Date(s) Administered  . Influenza Whole 01/07/2010    Social History  Substance Use Topics  . Smoking status: Former Smoker -- 15 years    Types: Cigarettes    Quit date: 04/09/1984  . Smokeless tobacco: Never Used  . Alcohol Use: No      Family History  Problem Relation Age of Onset  . Cancer Mother   . Deep vein thrombosis Mother   . Cancer Father   . Kidney disease Father   . Heart disease Father       Review of Systems  DATA OBTAINED: from patient, nurse GENERAL:  no fevers, fatigue, appetite changes SKIN: No itching, rash or wounds EYES: No eye pain, redness,  discharge EARS: No earache, tinnitus, change in hearing NOSE: No congestion, drainage or bleeding  MOUTH/THROAT: No mouth or tooth pain, No sore throat RESPIRATORY: No cough, wheezing, SOB CARDIAC: No chest pain, palpitations, lower extremity edema  GI: No abdominal pain, No N/V/D or constipation, No heartburn or reflux  GU: No dysuria, frequency or urgency, or incontinence  MUSCULOSKELETAL: No unrelieved bone/joint pain NEUROLOGIC: No  headache, dizziness or focal weakness PSYCHIATRIC: No c/o anxiety or sadness   Filed Vitals:   08/23/15 1018  BP: 125/62  Pulse: 84  Temp: 98 F (36.7 C)  Resp: 20    SpO2 Readings from Last 1 Encounters:  08/22/15 89%        Physical Exam  GENERAL APPEARANCE: Alert, conversant,  No acute distress.  SKIN: No diaphoresis rash; incision with normal swelling and heat HEAD: Normocephalic, atraumatic  EYES: Conjunctiva/lids clear. Pupils round, reactive. EOMs intact.  EARS: External exam WNL, canals clear. Hearing grossly normal.  NOSE: No deformity or discharge.  MOUTH/THROAT: Lips w/o lesions  RESPIRATORY: Breathing is even, unlabored. Lung sounds are clear ; chronic O2 2L [place]  CARDIOVASCULAR: Heart RRR no murmurs, rubs or gallops. No peripheral edema.   GASTROINTESTINAL: Abdomen is soft, non-tender, not distended w/ normal bowel sounds. GENITOURINARY: Bladder non tender, not distended  MUSCULOSKELETAL: No abnormal joints or musculature NEUROLOGIC:  Cranial nerves 2-12 grossly intact. Moves all extremities  PSYCHIATRIC: Mood and affect appropriate to situation, no behavioral issues  Patient Active Problem List   Diagnosis Date Noted  . Anxiety 08/29/2015  . Lung cancer ([place]) 08/29/2015  . Hypertensive heart disease without CHF 08/29/2015  . Hip fracture ([place]) 08/17/2015  . Hip fracture, right ([place]) 08/17/2015  . Fall 08/17/2015  . Anemia 08/17/2015  . Insomnia 08/17/2015  . Acute blood loss anemia 05/31/2015  . Acute upper  gastrointestinal bleeding 05/29/2015  . Leukocytosis 05/29/2015  . History of GI bleed 05/29/2015  . Acute encephalopathy 11/04/2013  . Noninfectious gastroenteritis and colitis 11/04/2013  . Protein-calorie malnutrition, severe ([place]) 11/02/2013  . Delirium 11/01/2013  . Altered mental state 11/01/2013  . COPD (chronic obstructive pulmonary disease) ([place]) 11/01/2013  . Chronic hypoxemic respiratory failure ([place]) 11/01/2013  . Toxic encephalopathy 10/03/2013  . UTI (urinary tract infection) 10/03/2013  . Acute-on-chronic respiratory failure ([place]) 10/03/2013  . Cerebral embolism with cerebral infarction ([place]) 09/18/2013  . Accidental overdose 09/14/2013  . Hypothermia 09/14/2013  . Spinal stenosis of lumbar region at multiple levels 02/15/2011  . PULMONARY NODULE 02/22/2010  . Nonspecific (abnormal) findings on radiological and other examination of body structure 11/29/2008  . CT, CHEST, ABNORMAL 11/29/2008  . Hypothyroidism 06/25/2007  . HYPERCHOLESTEROLEMIA 06/25/2007  . ANEMIA 06/25/2007  . AMI 06/25/2007  . ANGINA, MILD 06/25/2007  . CAD (coronary artery disease) 06/25/2007  . HEMORRHOIDS, INTERNAL 06/25/2007  . HEMORRHOIDS, EXTERNAL 06/25/2007  . HEMOCCULT POSITIVE STOOL 06/25/2007  . PYELONEPHRITIS, ACUTE 06/25/2007  . COLONIC POLYPS, HYPERPLASTIC 10/15/1997  . DIVERTICULOSIS, COLON 10/15/1997       Component Value Date/Time   WBC 10.2 08/22/2015 0359   WBC 10.6* 09/28/2009 1422   RBC 3.01* 08/22/2015 0359   RBC 4.01 09/28/2009 1422   RBC 4.07 09/28/2009 1422   HGB 9.3* 08/22/2015 0359   HGB 9.4* 09/28/2009 1422   HCT 27.8* 08/22/2015 0359   HCT 29.1* 09/28/2009 1422   PLT 246 08/22/2015 0359   PLT 319 09/28/2009 1422   MCV 92.4 08/22/2015 0359   MCV 73* 09/28/2009 1422   LYMPHSABS 0.5* 08/17/2015 0109   LYMPHSABS 2.0 09/28/2009 1422   MONOABS 0.4 08/17/2015 0109   EOSABS 0.0 08/17/2015 0109   EOSABS 0.7* 09/28/2009 1422   BASOSABS 0.0 08/17/2015 0109    BASOSABS 0.0 09/28/2009 1422        Component Value Date/Time   NA 140 08/22/2015 0359   K 3.4* 08/22/2015 0359   CL 107 08/22/2015 0359  CO2 28 08/22/2015 0359   GLUCOSE 123* 08/22/2015 0359   BUN 16 08/22/2015 0359   CREATININE 0.61 08/22/2015 0359   CALCIUM 8.4* 08/22/2015 0359   PROT 6.3* 05/29/2015 1735   ALBUMIN 4.0 05/29/2015 1735   AST 21 05/29/2015 1735   ALT 13* 05/29/2015 1735   ALKPHOS 59 05/29/2015 1735   BILITOT 0.4 05/29/2015 1735   GFRNONAA >60 08/22/2015 0359   GFRAA >60 08/22/2015 0359    Lab Results  Component Value Date   HGBA1C 5.6 09/17/2013    Lab Results  Component Value Date   CHOL 219* 09/17/2013   HDL 74 09/17/2013   LDLCALC 122* 09/17/2013   TRIG 117 09/17/2013   CHOLHDL 3.0 09/17/2013     Dg Chest 2 View  08/17/2015  CLINICAL DATA:  Dyspnea.  Hip fracture. EXAM: CHEST  2 VIEW COMPARISON:  None. FINDINGS: A single supine AP view of the chest is negative for pneumothorax or large effusion. Mediastinal contours are normal. The lungs are clear. The pulmonary vasculature is normal. IMPRESSION: No acute cardiopulmonary findings. Electronically Signed   By: Andreas Newport M.D.   On: 08/17/2015 01:17   Ct Head Wo Contrast  08/17/2015  CLINICAL DATA:  [place] 3 hours ago. EXAM: CT HEAD WITHOUT CONTRAST TECHNIQUE: Contiguous axial images were obtained from the base of the skull through the vertex without intravenous contrast. COMPARISON:  11/03/2013 FINDINGS: There is no intracranial hemorrhage, mass or evidence of acute infarction. There is moderate generalized atrophy. There is moderate chronic microvascular ischemic change. There is remote lacunar infarction in the right caudate and right putamen. There is no significant extra-axial fluid collection. No acute intracranial findings are evident. The calvarium and skullbase are intact. The visible paranasal sinuses and orbits are unremarkable. IMPRESSION: No acute intracranial findings. There is moderate  generalized atrophy, remote lacunar infarctions, and chronic appearing white matter hypodensities which likely represent small vessel ischemic disease. Electronically Signed   By: Andreas Newport M.D.   On: 08/17/2015 00:52   Dg C-arm 1-60 Min-no Report  08/17/2015  CLINICAL DATA: right hip IM nail C-ARM 1-60 MINUTES Fluoroscopy was utilized by the requesting physician.  No radiographic interpretation.   Dg Hip Operative Unilat With Pelvis Right  08/17/2015  CLINICAL DATA:  RIGHT intra medullary nail fixation of intertrochanteric fracture EXAM: OPERATIVE RIGHT HIP (WITH PELVIS IF PERFORMED)  VIEWS TECHNIQUE: Fluoroscopic spot image(s) were submitted for interpretation post-operatively. COMPARISON:  08/16/2015 FINDINGS: Intra medullary nail fixation of intertrochanteric fracture. Two compression screws noted. Distal nail normal. IMPRESSION: No complication following fine IM nail fixation of RIGHT intertrochanteric fracture. Electronically Signed   By: Suzy Bouchard M.D.   On: 08/17/2015 19:48   Dg Hip Unilat  With Pelvis 2-3 Views Right  08/16/2015  CLINICAL DATA:  [place] while walking her dog at 20:00. EXAM: DG HIP (WITH OR WITHOUT PELVIS) 2-3V RIGHT COMPARISON:  None. FINDINGS: There is an intertrochanteric right hip fracture, well aligned. No dislocation. Moderate right hip arthritis. Left hip is moderately arthritic as well. IMPRESSION: Intertrochanteric right hip fracture. Electronically Signed   By: Andreas Newport M.D.   On: 08/16/2015 23:59    Not all labs, radiology exams or other studies done during hospitalization come through on my EPIC note; however they are reviewed by me.    Assessment and Plan  1. Suspected lung cancer. -A CT scan of lungs with IV contrast had been ordered to further workup her shortness of breath and assess for pulmonary embolism. -Unfortunately CT  scan showing multiple pulmonary lesions bilaterally concerning for multifocal primary bronchogenic  adenocarcinoma. -She has a history of secondhand exposure from her husband who was a heavy smoker. She had also smoked a self for a short period of time. -Spoke with Dr Pennie Banter who recommended further work up in the outpatient setting at which point Iatan with biopsy could be dicussed.  SNF - set up pt for pulmonary f/u at SNF d/c  2. Right hip fracture. -Mrs Montminy is an 82 year old female who suffered a fall landing on concrete. She had leaned over to pick up her dog's leash reporting losing her balance. Denies symptoms prior to event. -Imaging studies performed on admission revealed a right hip intertrochanteric fracture. -Requested consultation from [facility] orthopedics -On 08/17/2015 she was taken to the operating room or she underwent right hip intramedullary nail placement, procedure performed by Dr. Ninfa Linden of orthopedic surgery. She tolerated procedure well there no immediate complications. - Review lab work on 08/19/2015 showing a downward trend in her hemoglobin to 7.8. On this date she was typed and cross and transfuse with a unit of packed red blood cells.  -On Repeat labs on 08/21/2015 show a hemoglobin of 9.7 SNF - will f/u CBC ;pt on eliquis 2.5 mg BID for 10 days as prophylaxis  2. Acute respiratory failure/Volume overload -On 08/19/2015 she developed increasing shortness of breath going into respiratory distress after receiving a unit of packed red blood cells. She had required placement of injury mask at 14L -On my evaluation on 08/21/2015 she continues to report nonexertional dyspnea. On 4 L she was satting 88-89 percent however nursing staff reporting that while asleep she had been satting in the mid 90s. I think there is an anxiety component however other considerations must be taken into account. On the chest x-ray report from 08/19/2015 the radiologist had mentioned fullness in the right hilar region favoring vascular and stated this could represent an acutely enlarged  pulmonary artery associated with pulmonary embolism. This was further workup with CT. Did not show PE -As mentioned above CT scan showing evidence of multifocal bronchogenic cancer. SNF - pt was not d/c on lasix s not felt to be fluid overload; prn O2 to keep sats > 92%   3. Leukocytosis. -Resolved.  4. Hypertension -Blood pressure stable; SNF - continue felodipine 10 mg by mouth daily  Anxiety SNF - cont buspar 7.5 mg BID; can in to 15 mg id needed  COPD (chronic obstructive pulmonary disease) (HCC) SNF - chronic and stable- start dulera BID and continue incruse ellipta daily, spiriva daily and duoneb prn  History of GI bleed SNF - cont prilosec 20 mg daily; follow CBC while pt onn eliquis   Time spent > 45 min;> 50% of time with patient was spent reviewing records, labs, tests and studies, counseling and developing plan of care  Webb Silversmith D. Sheppard Coil, MD

## 2015-08-29 ENCOUNTER — Encounter: Payer: Self-pay | Admitting: Internal Medicine

## 2015-08-29 ENCOUNTER — Non-Acute Institutional Stay (SKILLED_NURSING_FACILITY): Payer: Medicare Other | Admitting: Internal Medicine

## 2015-08-29 DIAGNOSIS — S72001D Fracture of unspecified part of neck of right femur, subsequent encounter for closed fracture with routine healing: Secondary | ICD-10-CM

## 2015-08-29 DIAGNOSIS — C3490 Malignant neoplasm of unspecified part of unspecified bronchus or lung: Secondary | ICD-10-CM | POA: Insufficient documentation

## 2015-08-29 DIAGNOSIS — F419 Anxiety disorder, unspecified: Secondary | ICD-10-CM | POA: Insufficient documentation

## 2015-08-29 DIAGNOSIS — F411 Generalized anxiety disorder: Secondary | ICD-10-CM

## 2015-08-29 DIAGNOSIS — D62 Acute posthemorrhagic anemia: Secondary | ICD-10-CM

## 2015-08-29 DIAGNOSIS — I119 Hypertensive heart disease without heart failure: Secondary | ICD-10-CM | POA: Insufficient documentation

## 2015-08-29 NOTE — Progress Notes (Signed)
Location:  [facility] Room Number: 160/V Place of Service:  SNF [phone]) Provider:  [place] Lewis  Smothers, Andree Elk, NP  Patient Care Team: Junie Panning, NP as PCP - General (Nurse Practitioner) Marvene Staff (Family Medicine)  Extended Emergency Contact Information Primary Emergency Contact: [name] [address] [address] of [place] Phone: [phone] Work Phone: [phone] Mobile Phone: [phone] Relation: Daughter Secondary Emergency Contact: [name] [place] of [place] Phone: [phone] Relation: Neighbor Goals of care: Advanced Directive information Advanced Directives 08/29/2015  Does patient have an advance directive? Yes  Type of Advance Directive Out of facility DNR (pink MOST or yellow form)  Does patient want to make changes to advanced directive? No - Patient declined  Copy of advanced directive(s) in chart? Yes     Chief Complaint  Patient presents with  . Acute Visit   Follow-up right hip fracture with anxiety HPI:  Pt is a 82 y.o. female seen today for an acute visit for anxiety with history of right hip fracture.  Patient's physical therapy apparently is hindered because of anxiety before therapy-patient states she does get quite anxious before therapy.  I do note she is on BuSpar 7.5 mg twice a day-patient states she does not really have a significant history of anxiety although apparently she has been on BuSpar for some time.  She does not complain of any shortness of breath chest pain or palpitations-.  She does have a history of respiratory failure she is oxygen dependent and this is been stable during her stay here.  Apparently she had acute respiratory failure in hospital after being transfused status post surgery-CT scan did not show pulmonary embolism however did show possible lung carcinoma and this will be followed up on as an outpatient.  Today  she is resting in bed comfortably and appears physical therapy's biggest hurdle is some patient anxiety before therapy.  Of note she did have postop anemia which required transfusion with hemoglobin rose from 7.8 up to 9.7 on lab done May 14 we will update this.     Past Medical History  Diagnosis Date  . COPD (chronic obstructive pulmonary disease) ([place])   . Carotid artery occlusion   . Arthritis   . Anxiety     Afraid , living alone  . Myocardial infarction ([place]) 1987  . Anemia   . Bilateral leg pain     for years  . Femoral bruit   . Bronchitis, chronic ([place])   . Fall April 2014    Pt. has fallen twife in the last 2 months.   . Personal history of other diseases of digestive system     Upper GI bleed  . Generalized anxiety disorder   . Nonspecific abnormal electrocardiogram (ECG) (EKG)     Abnormal Results EKG  . Memory loss     Worsening  . Intestinal infection due to Clostridium difficile   . Colitis due to Clostridium difficile   . Hypertension   . CAD (coronary artery disease)   . Thyroid disease     Hypo-Thyroidism   Past Surgical History  Procedure Laterality Date  . Carotid endarterectomy  12/30/2008  . Abdominal aortagram N/A 09/19/2012    Procedure: ABDOMINAL Maxcine Ham;  Surgeon: Elam Dutch, MD;  Location: [facility] CATH LAB;  Service: Cardiovascular;  Laterality: N/A;  . Lower extremity angiogram Bilateral 09/19/2012    Procedure: LOWER EXTREMITY ANGIOGRAM;  Surgeon: Elam Dutch, MD;  Location: [facility] CATH LAB;  Service: Cardiovascular;  Laterality: Bilateral;  . Femur im nail Right 08/17/2015    Procedure: RIGHT HIP INTRAMEDULLARY (IM) NAIL FEMORAL;  Surgeon: Mcarthur Rossetti, MD;  Location: [facility] ORS;  Service: Orthopedics;  Laterality: Right;    Allergies  Allergen Reactions  . Aspirin     GI  Bleed  . Penicillins     Numbness around mouth  Has patient had a PCN reaction causing immediate rash, facial/tongue/throat swelling, SOB or lightheadedness  with hypotension: Yes Has patient had a PCN reaction causing severe rash involving mucus membranes or skin necrosis: No Has patient had a PCN reaction that required hospitalization No Has patient had a PCN reaction occurring within the last 10 years: No If all of the above answers are "NO", then may proceed with Cephalosporin use.    . Biaxin [Clarithromycin] Nausea Only  . Sulfonamide Derivatives Nausea And Vomiting  . Buprenorphine Nausea And Vomiting  . Codeine Nausea And Vomiting  . Hydrocodone Nausea And Vomiting  . Morphine And Related Nausea Only    Nausea   . Sulfa Antibiotics Nausea And Vomiting  . Xanax [Alprazolam]     unknown      Medication List       This list is accurate as of: 08/29/15  4:30 PM.  Always use your most recent med list.               busPIRone 7.5 MG tablet  Commonly known as:  BUSPAR  Take 7.5 mg by mouth 2 (two) times daily.     citalopram 20 MG tablet  Commonly known as:  CELEXA  Take 20 mg by mouth daily.     docusate sodium 100 MG capsule  Commonly known as:  COLACE  Take 1 capsule (100 mg total) by mouth 2 (two) times daily.     ELIQUIS 2.5 MG Tabs tablet  Generic drug:  apixaban  Give 1 tablet by mouth twice daily x 10 days for DVT Prophylaxis STOP DATE 09/01/15     feeding supplement (ENSURE ENLIVE) Liqd  Take 237 mLs by mouth 3 (three) times daily between meals.     felodipine 5 MG 24 hr tablet  Commonly known as:  PLENDIL  Take 10 mg by mouth daily.     hydrOXYzine 25 MG tablet  Commonly known as:  ATARAX/VISTARIL  Take 25 mg by mouth daily as needed (sleep).     INCRUSE ELLIPTA 62.5 MCG/INH Aepb  Generic drug:  umeclidinium bromide  Inhale 1 puff into the lungs daily. Rinse mouth out with water after use. (COPD )     ipratropium-albuterol 0.5-2.5 (3) MG/3ML Soln  Commonly known as:  DUONEB  Take 3 mLs by nebulization every 6 (six) hours as needed (shortness of breath).     levothyroxine 88 MCG tablet  Commonly  known as:  SYNTHROID, LEVOTHROID  Take 88 mcg by mouth daily.     mometasone-formoterol 100-5 MCG/ACT Aero  Commonly known as:  DULERA  Inhale 2 puffs into the lungs 2 (two) times daily.     nitroGLYCERIN 0.4 MG SL tablet  Commonly known as:  NITROSTAT  Place 0.4 mg under the tongue every 5 (five) minutes as needed for chest pain.     omeprazole 20 MG capsule  Commonly known as:  PRILOSEC  Take 1 capsule (20 mg total) by mouth daily.     oxyCODONE-acetaminophen 5-325 MG tablet  Commonly known as:  PERCOCET/ROXICET  Take 1 tablet by mouth every 8 (eight) hours as needed.     potassium chloride 10 MEQ CR capsule  Commonly known as:  MICRO-K  Take 10 mEq by mouth daily.     tiotropium 18 MCG inhalation capsule  Commonly known as:  SPIRIVA  Place 1 capsule (18 mcg total) into inhaler and inhale daily.     zolpidem 5 MG tablet  Commonly known as:  AMBIEN  Take 1 tablet (5 mg total) by mouth at bedtime as needed for sleep.        Review of Systems   General does not complain of fever or chills.  Skin is not complaining of rashes or itching surgical site right hip is currently covered.  Chest respiratory does not complain of shortness of breath or cough currently.  Cardiac does not complain of chest pain.  GI is not complaining of nausea vomiting diarrhea or constipation or abdominal discomfort.  Musculoskeletal is not complaining of joint pain currently or hip discomfort however there is anxiety before therapy.  Neurologic is not complaining of numbness or headache.  Psych again does appear to have some history of anxiety more so this is before therapy according to nursing.    Immunization History  Administered Date(s) Administered  . Influenza Whole 01/07/2010   Pertinent  Health Maintenance Due  Topic Date Due  . DEXA SCAN  08/28/2016 (Originally 04/30/1997)  . PNA vac Low Risk Adult (1 of 2 - PCV13) 08/28/2016 (Originally 04/30/1997)  . INFLUENZA VACCINE   11/08/2015   No flowsheet data found. Functional Status Survey:    Filed Vitals:   08/29/15 1516  BP: 125/62  Pulse: 82  Temp: 98 F (36.7 C)  TempSrc: Oral  Resp: 20  Height: '5\' 3"'$  (1.6 m)  Weight: 101 lb 12.8 oz (46.176 kg)   Body mass index is 18.04 kg/(m^2). Physical Exam   In general this is a pleasant frail elderly female in no distress or syncopal be in bed.  Her skin is warm and dry she has dressing over the right hip surgical site.  Eyes pupils appear reactive to light sclera and conjunctiva are clear visual acuity appears grossly intact.  Chest is clear to auscultation with somewhat shallow air entry there is no labored breathing.  Heart is regular rate and rhythm without murmur gallop or rub she has no significant lower extremity edema.  Abdomen soft nontender positive bowel sounds.  Musculoskeletal I did not note any abnormal joints is able to move all 4 with limited range of motion of her right lower extremity status post surgery again there is covering over the surgical site.  Neurologic grossly intact cranial nerves intact no lateralizing findings her speech is clear.  Psych she is alert and oriented pleasant and appropriate does not appear overtly anxious this evening but apparently there are periods of this Labs reviewed:  Recent Labs  08/19/15 0408 08/20/15 0402 08/22/15 0359  NA 144 144 140  K 3.7 3.2* 3.4*  CL 108 107 107  CO2 '26 28 28  '$ GLUCOSE 129* 139* 123*  BUN 23* 16 16  CREATININE 0.76 0.55 0.61  CALCIUM 8.4* 8.6* 8.4*    Recent Labs  05/29/15 1735  AST 21  ALT 13*  ALKPHOS 59  BILITOT 0.4  PROT 6.3*  ALBUMIN 4.0    Recent Labs  05/29/15 1735  08/17/15 0109  08/20/15 0402 08/21/15 0917 08/22/15 0359  WBC 11.2*  < > 15.1*  < > 11.5*  10.4 10.2  NEUTROABS 8.3*  --  14.2*  --   --   --   --   HGB 9.0*  < > 11.2*  < > 9.5* 9.7* 9.3*  HCT 26.3*  < > 34.1*  < > 28.4* 28.7* 27.8*  MCV 85.4  < > 91.4  < > 91.6 89.7 92.4  PLT  259  < > 308  < > 216 237 246  < > = values in this interval not displayed. Lab Results  Component Value Date   TSH 0.781 11/02/2013   Lab Results  Component Value Date   HGBA1C 5.6 09/17/2013   Lab Results  Component Value Date   CHOL 219* 09/17/2013   HDL 74 09/17/2013   LDLCALC 122* 09/17/2013   TRIG 117 09/17/2013   CHOLHDL 3.0 09/17/2013    Significant Diagnostic Results in last 30 days:  Dg Chest 1 View  08/19/2015  CLINICAL DATA:  82 year old female with shortness of breath. Right hip fracture. EXAM: CHEST 1 VIEW COMPARISON:  Chest x-ray 08/17/2015. FINDINGS: Mild emphysematous changes are noted throughout the lungs bilaterally. No acute consolidative airspace disease. No pleural effusions. No evidence of pulmonary edema. Heart size is normal. New fullness in the right hilar region. Atherosclerosis in the thoracic aorta. IMPRESSION: 1. New fullness in the right hilar region. This is favored to be vascular, and could represent an acutely enlarged pulmonary artery (e.g., "Fleischner sign" associated with pulmonary embolism). Given the patient's history, clinical correlation for signs and symptoms of pulmonary embolism is suggested, with consideration for further evaluation with PE protocol CT scan if clinically appropriate. 2. Atherosclerosis. These results will be called to the ordering clinician or representative by the Radiologist Assistant, and communication documented in the PACS or zVision Dashboard. Electronically Signed   By: Vinnie Langton M.D.   On: 08/19/2015 19:57   Dg Chest 2 View  08/17/2015  CLINICAL DATA:  Dyspnea.  Hip fracture. EXAM: CHEST  2 VIEW COMPARISON:  None. FINDINGS: A single supine AP view of the chest is negative for pneumothorax or large effusion. Mediastinal contours are normal. The lungs are clear. The pulmonary vasculature is normal. IMPRESSION: No acute cardiopulmonary findings. Electronically Signed   By: Andreas Newport M.D.   On: 08/17/2015 01:17     Ct Head Wo Contrast  08/17/2015  CLINICAL DATA:  [place] 3 hours ago. EXAM: CT HEAD WITHOUT CONTRAST TECHNIQUE: Contiguous axial images were obtained from the base of the skull through the vertex without intravenous contrast. COMPARISON:  11/03/2013 FINDINGS: There is no intracranial hemorrhage, mass or evidence of acute infarction. There is moderate generalized atrophy. There is moderate chronic microvascular ischemic change. There is remote lacunar infarction in the right caudate and right putamen. There is no significant extra-axial fluid collection. No acute intracranial findings are evident. The calvarium and skullbase are intact. The visible paranasal sinuses and orbits are unremarkable. IMPRESSION: No acute intracranial findings. There is moderate generalized atrophy, remote lacunar infarctions, and chronic appearing white matter hypodensities which likely represent small vessel ischemic disease. Electronically Signed   By: Andreas Newport M.D.   On: 08/17/2015 00:52   Ct Angio Chest Pe W/cm &/or Wo Cm  08/21/2015  CLINICAL DATA:  82 year old female with history of shortness of breath and clinical concern for potential pulmonary embolism. History of recent fall with right intertrochanteric hip fracture status post ORIF on 08/17/2015. EXAM: CT ANGIOGRAPHY CHEST WITH CONTRAST TECHNIQUE: Multidetector CT imaging of the chest was performed using the standard protocol  during bolus administration of intravenous contrast. Multiplanar CT image reconstructions and MIPs were obtained to evaluate the vascular anatomy. CONTRAST:  100 mL of Isovue 370. COMPARISON:  Chest CT 11/10/2008. FINDINGS: Mediastinum/Lymph Nodes: Study is slightly limited by patient respiratory motion. Despite this limitation, there is no central, lobar or segmental sized filling defect within the pulmonary arteries to suggest clinically relevant pulmonary embolism. Smaller distal subsegmental sized pulmonary embolism cannot be entirely  excluded. Heart size is borderline enlarged. Small amount of pericardial fluid and/or thickening, unlikely to be of any hemodynamic significance at this time. No associated pericardial calcification. There is atherosclerosis of the thoracic aorta, the great vessels of the mediastinum and the coronary arteries, including calcified atherosclerotic plaque in the left main, left anterior descending and right coronary arteries. Mild calcifications of the aortic and mitral valve sparing No pathologically enlarged mediastinal or hilar lymph nodes. Esophagus is unremarkable in appearance. No axillary lymphadenopathy. Lungs/Pleura: Small bilateral pleural effusions lying dependently (right greater than left) with some associated passive subsegmental atelectasis in the lower lobes of the lungs bilaterally. Importantly, the previously noted nodular areas of ground-glass attenuation and architectural distortion in the upper lobes the lungs bilaterally have increased in size compared to prior study 11/10/2008. The lesion in the left apex currently measures 2.9 x 1.5 cm (image 19 of series 7) previously 2.1 x 0.8 cm on 11/10/2008, and appears as a more dense area of ground-glass attenuation, septal thickening and demonstrates some internal bronchiolectasis, concerning potential adenocarcinoma. The lesion in the right apex which was previously a 1.3 cm pure ground-glass attenuation nodule currently measures 1.8 x 2.6 cm (image 25 of series 7) and has a central 8 mm solid component (image 17 of series 4), also concerning for potential adenocarcinoma. As well, there is a new peripheral ground-glass attenuation lesion measuring 2.8 x 1.5 cm in the right upper lobe (image 50 of series 7) which also has a central 12 mm solid component (image 34 of series 4), concerning for additional focus of disease. A few other scattered ill-defined areas of ground-glass attenuation are also noted. Mild diffuse bronchial wall thickening. Partial  collapse of lower lobe bronchi during expiration, indicative of bronchomalacia. Upper Abdomen: Calcified granuloma in the periphery of segment 8 of the liver. Atherosclerosis. Musculoskeletal/Soft Tissues: There are no aggressive appearing lytic or blastic lesions noted in the visualized portions of the skeleton. Review of the MIP images confirms the above findings. IMPRESSION: 1. Despite the mild limitations of today's examination there is no evidence to suggest clinically relevant central, lobar or segmental sized pulmonary embolism. 2. However, there are multiple pulmonary lesions bilaterally which have the appearance concerning for probable multifocal primary bronchogenic adenocarcinoma. While one of these lesions in the right upper lobe is new, the other 2 lesions in the apices of the upper lobes of the lungs bilaterally have clearly enlarged compared to remote prior study 11/10/2008. Further evaluation with nonemergent PET-CT is recommended in the near future for diagnostic and staging purposes. 3. Small bilateral pleural effusions (right greater than left) lying dependently with minimal passive subsegmental atelectasis in the lower lobes of the lungs bilaterally. 4. Evidence of bronchomalacia, as above. 5. Small amount of pericardial fluid and/or thickening, unlikely to be of hemodynamic significance at this time. 6. Atherosclerosis, including left main and 2 vessel coronary artery disease. These results will be called to the ordering clinician or representative by the Radiologist Assistant, and communication documented in the PACS or zVision Dashboard. Electronically Signed   By: Quillian Quince  Entrikin M.D.   On: 08/21/2015 11:28   Ct Abdomen Pelvis W Contrast  08/22/2015  CLINICAL DATA:  82 year old with chest CT yesterday demonstrating enlarging semi-solid masses in both lungs worrisome for bronchogenic adenocarcinoma. Staging CT. EXAM: CT ABDOMEN AND PELVIS WITH CONTRAST TECHNIQUE: Multidetector CT imaging  of the abdomen and pelvis was performed using the standard protocol following bolus administration of intravenous contrast. CONTRAST:  159m ISOVUE-300 IOPAMIDOL INJECTION 61% IV. Oral contrast was also administered. COMPARISON:  None. FINDINGS: Lower chest: Small bilateral pleural effusions, right greater than left, and associated passive atelectasis in the lower lobes. Very small pericardial effusion. Aortic annular and mitral annular calcification. Hepatobiliary: Liver normal in size and appearance. Small gallstones within an otherwise normal appearing gallbladder. Mild intra and extrahepatic biliary ductal dilation, the common duct measuring up to approximately 12 mm diameter. No visible obstructing mass or stone within the duct. Pancreas: Atrophic, accounting for the mild pancreatic ductal dilation. No parenchymal mass or peripancreatic inflammation. Spleen: Normal in size and appearance. Adrenals/Urinary Tract: Normal appearing adrenal glands. Focal scarring involving the upper pole and mid pole of the left kidney. Subcentimeter cortical cyst involving the mid left kidney. No solid mass involving either kidney. No hydronephrosis. No urinary tract calculi. Atherosclerotic arterial calcification within both kidneys mimic calculi. Stomach/Bowel: Small hiatal hernia. Stomach decompressed and otherwise unremarkable. Normal-appearing small bowel. Diffuse colonic diverticulosis without evidence of acute diverticulitis. Colon otherwise unremarkable. Appendix not clearly visualized. No ascites. Vascular/Lymphatic: Severe aorto-iliofemoral atherosclerosis without aneurysm. No pathologic lymphadenopathy. Reproductive: Uterus atrophic as expected for age. No adnexal masses. Other: Edema involving the subcutaneous tissues of the right lateral abdominal wall and right gluteal region. Musculoskeletal: Thoracolumbar levoscoliosis. Degenerative disc disease and spondylosis at every visualized lower thoracic and lumbar level  with severe facet degenerative changes throughout the lumbar spine and severe multifactorial spinal stenosis at L4-5. IMPRESSION: 1. No evidence of metastatic disease involving the abdomen or pelvis. 2. No acute abnormalities involving the abdomen or pelvis. 3. Mild intra and extrahepatic biliary ductal dilation without visible obstructing stone or mass. 4. Small hiatal hernia. 5. Pancreatic atrophy. 6. Scarring involving the upper pole and mid pole of the left kidney. 7. 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 8. Skeletal findings as above, most significantly severe multifactorial spinal stenosis at the L4-5 level. 9. Small bilateral pleural effusions, right greater than left, with associated passive atelectasis involving the lower lobes. Electronically Signed   By: TEvangeline DakinM.D.   On: 08/22/2015 12:46   Dg C-arm 1-60 Min-no Report  08/17/2015  CLINICAL DATA: right hip IM nail C-ARM 1-60 MINUTES Fluoroscopy was utilized by the requesting physician.  No radiographic interpretation.   Dg Hip Operative Unilat With Pelvis Right  08/17/2015  CLINICAL DATA:  RIGHT intra medullary nail fixation of intertrochanteric fracture EXAM: OPERATIVE RIGHT HIP (WITH PELVIS IF PERFORMED)  VIEWS TECHNIQUE: Fluoroscopic spot image(s) were submitted for interpretation post-operatively. COMPARISON:  08/16/2015 FINDINGS: Intra medullary nail fixation of intertrochanteric fracture. Two compression screws noted. Distal nail normal. IMPRESSION: No complication following fine IM nail fixation of RIGHT intertrochanteric fracture. Electronically Signed   By: SSuzy BouchardM.D.   On: 08/17/2015 19:48   Dg Hip Unilat  With Pelvis 2-3 Views Right  08/16/2015  CLINICAL DATA:  [place] 20:[place]. EXAM: DG HIP (WITH OR WITHOUT PELVIS) 2-3V RIGHT COMPARISON:  None. FINDINGS: There is an intertrochanteric right hip fracture, well aligned. No dislocation. Moderate right hip arthritis. Left hip is  moderately arthritic as  well. IMPRESSION: Intertrochanteric right hip fracture. Electronically Signed   By: Andreas Newport M.D.   On: 08/16/2015 23:59    Assessment/Plan  #1-history right fracture-at this point pain appears to be controlled on when necessary Percocet every 8 hours-she is on Eliquist for anticoagulation --- she still has significant anxiety before therapy per nursing-patient also admits to this.  She is on BuSpar 7.5 mg twice a day-will increase this to 15 mg twice a day fact Dr. Sheppard Coil in her note said that this could be increased if anxiety persists.  At this point will not start when necessary Ativan or Xanax although this will have to be monitored before therapy to see if the BuSpar dose is effective.  #2 history postop anemia last hemoglobin was 9.3 which appears to show slight drop from 9.7 which he did rise to after transfusion-will update a CBC also a metabolic panel I note potassium was minimally low at 3.4 most recent lab--she is on low-dose potassium  [id number]       Oralia [place], [place] [phone]

## 2015-08-29 NOTE — Assessment & Plan Note (Signed)
SNF - cont buspar 7.5 mg BID; can in to 15 mg id needed

## 2015-08-29 NOTE — Assessment & Plan Note (Signed)
SNF - chronic and stable- start dulera BID and continue incruse ellipta daily, spiriva daily and duoneb prn

## 2015-08-29 NOTE — Assessment & Plan Note (Signed)
SNF - cont prilosec 20 mg daily; follow CBC while pt onn eliquis

## 2015-08-31 ENCOUNTER — Inpatient Hospital Stay: Payer: Self-pay | Admitting: Internal Medicine

## 2015-09-06 ENCOUNTER — Observation Stay (HOSPITAL_COMMUNITY): Payer: Medicare Other

## 2015-09-06 ENCOUNTER — Encounter (HOSPITAL_COMMUNITY): Payer: Self-pay | Admitting: Emergency Medicine

## 2015-09-06 ENCOUNTER — Emergency Department (HOSPITAL_COMMUNITY): Payer: Medicare Other

## 2015-09-06 ENCOUNTER — Inpatient Hospital Stay (HOSPITAL_COMMUNITY)
Admission: EM | Admit: 2015-09-06 | Discharge: 2015-09-09 | DRG: 389 | Disposition: A | Discharge status: Skilled Nursing Facility | Payer: Medicare Other | Attending: Internal Medicine | Admitting: Internal Medicine

## 2015-09-06 ENCOUNTER — Non-Acute Institutional Stay (SKILLED_NURSING_FACILITY): Payer: Medicare Other | Admitting: Internal Medicine

## 2015-09-06 ENCOUNTER — Encounter: Payer: Self-pay | Admitting: Internal Medicine

## 2015-09-06 DIAGNOSIS — R1114 Bilious vomiting: Secondary | ICD-10-CM

## 2015-09-06 DIAGNOSIS — Z885 Allergy status to narcotic agent status: Secondary | ICD-10-CM

## 2015-09-06 DIAGNOSIS — R112 Nausea with vomiting, unspecified: Secondary | ICD-10-CM | POA: Diagnosis present

## 2015-09-06 DIAGNOSIS — D72829 Elevated white blood cell count, unspecified: Secondary | ICD-10-CM | POA: Diagnosis present

## 2015-09-06 DIAGNOSIS — Z0189 Encounter for other specified special examinations: Secondary | ICD-10-CM

## 2015-09-06 DIAGNOSIS — J438 Other emphysema: Secondary | ICD-10-CM | POA: Diagnosis not present

## 2015-09-06 DIAGNOSIS — E039 Hypothyroidism, unspecified: Secondary | ICD-10-CM | POA: Diagnosis not present

## 2015-09-06 DIAGNOSIS — I2510 Atherosclerotic heart disease of native coronary artery without angina pectoris: Secondary | ICD-10-CM | POA: Diagnosis present

## 2015-09-06 DIAGNOSIS — K565 Intestinal adhesions [bands] with obstruction (postprocedural) (postinfection): Secondary | ICD-10-CM | POA: Diagnosis not present

## 2015-09-06 DIAGNOSIS — E876 Hypokalemia: Secondary | ICD-10-CM | POA: Diagnosis not present

## 2015-09-06 DIAGNOSIS — Z66 Do not resuscitate: Secondary | ICD-10-CM | POA: Diagnosis present

## 2015-09-06 DIAGNOSIS — I739 Peripheral vascular disease, unspecified: Secondary | ICD-10-CM | POA: Diagnosis present

## 2015-09-06 DIAGNOSIS — F411 Generalized anxiety disorder: Secondary | ICD-10-CM | POA: Diagnosis present

## 2015-09-06 DIAGNOSIS — J449 Chronic obstructive pulmonary disease, unspecified: Secondary | ICD-10-CM | POA: Diagnosis present

## 2015-09-06 DIAGNOSIS — R1110 Vomiting, unspecified: Secondary | ICD-10-CM

## 2015-09-06 DIAGNOSIS — Z880 Allergy status to penicillin: Secondary | ICD-10-CM

## 2015-09-06 DIAGNOSIS — K5669 Other intestinal obstruction: Secondary | ICD-10-CM | POA: Diagnosis not present

## 2015-09-06 DIAGNOSIS — R1031 Right lower quadrant pain: Secondary | ICD-10-CM | POA: Diagnosis not present

## 2015-09-06 DIAGNOSIS — I10 Essential (primary) hypertension: Secondary | ICD-10-CM | POA: Diagnosis present

## 2015-09-06 DIAGNOSIS — Z79899 Other long term (current) drug therapy: Secondary | ICD-10-CM

## 2015-09-06 DIAGNOSIS — Z886 Allergy status to analgesic agent status: Secondary | ICD-10-CM

## 2015-09-06 DIAGNOSIS — I252 Old myocardial infarction: Secondary | ICD-10-CM

## 2015-09-06 DIAGNOSIS — Z8249 Family history of ischemic heart disease and other diseases of the circulatory system: Secondary | ICD-10-CM

## 2015-09-06 DIAGNOSIS — I313 Pericardial effusion (noninflammatory): Secondary | ICD-10-CM | POA: Diagnosis present

## 2015-09-06 DIAGNOSIS — Z841 Family history of disorders of kidney and ureter: Secondary | ICD-10-CM

## 2015-09-06 DIAGNOSIS — K56609 Unspecified intestinal obstruction, unspecified as to partial versus complete obstruction: Secondary | ICD-10-CM

## 2015-09-06 DIAGNOSIS — Z881 Allergy status to other antibiotic agents status: Secondary | ICD-10-CM

## 2015-09-06 DIAGNOSIS — Z87891 Personal history of nicotine dependence: Secondary | ICD-10-CM

## 2015-09-06 DIAGNOSIS — K219 Gastro-esophageal reflux disease without esophagitis: Secondary | ICD-10-CM | POA: Diagnosis present

## 2015-09-06 DIAGNOSIS — Z96649 Presence of unspecified artificial hip joint: Secondary | ICD-10-CM

## 2015-09-06 DIAGNOSIS — Z809 Family history of malignant neoplasm, unspecified: Secondary | ICD-10-CM

## 2015-09-06 DIAGNOSIS — Z888 Allergy status to other drugs, medicaments and biological substances status: Secondary | ICD-10-CM

## 2015-09-06 DIAGNOSIS — R000 Tachycardia, unspecified: Secondary | ICD-10-CM | POA: Diagnosis present

## 2015-09-06 DIAGNOSIS — Z96641 Presence of right artificial hip joint: Secondary | ICD-10-CM | POA: Diagnosis present

## 2015-09-06 DIAGNOSIS — Z882 Allergy status to sulfonamides status: Secondary | ICD-10-CM

## 2015-09-06 DIAGNOSIS — Z9981 Dependence on supplemental oxygen: Secondary | ICD-10-CM

## 2015-09-06 LAB — COMPREHENSIVE METABOLIC PANEL
ALT: 20 U/L (ref 14–54)
AST: 26 U/L (ref 15–41)
Albumin: 3.9 g/dL (ref 3.5–5.0)
Alkaline Phosphatase: 105 U/L (ref 38–126)
Anion gap: 11 (ref 5–15)
BUN: 22 mg/dL — ABNORMAL HIGH (ref 6–20)
CHLORIDE: 99 mmol/L — AB (ref 101–111)
CO2: 29 mmol/L (ref 22–32)
CREATININE: 0.69 mg/dL (ref 0.44–1.00)
Calcium: 9.2 mg/dL (ref 8.9–10.3)
Glucose, Bld: 140 mg/dL — ABNORMAL HIGH (ref 65–99)
Potassium: 3.3 mmol/L — ABNORMAL LOW (ref 3.5–5.1)
Sodium: 139 mmol/L (ref 135–145)
Total Bilirubin: 1 mg/dL (ref 0.3–1.2)
Total Protein: 6.9 g/dL (ref 6.5–8.1)

## 2015-09-06 LAB — CBC
HCT: 37.3 % (ref 36.0–46.0)
Hemoglobin: 12.1 g/dL (ref 12.0–15.0)
MCH: 30.6 pg (ref 26.0–34.0)
MCHC: 32.4 g/dL (ref 30.0–36.0)
MCV: 94.2 fL (ref 78.0–100.0)
PLATELETS: 484 10*3/uL — AB (ref 150–400)
RBC: 3.96 MIL/uL (ref 3.87–5.11)
RDW: 14.3 % (ref 11.5–15.5)
WBC: 12.8 10*3/uL — AB (ref 4.0–10.5)

## 2015-09-06 LAB — URINALYSIS, ROUTINE W REFLEX MICROSCOPIC
Glucose, UA: NEGATIVE mg/dL
Hgb urine dipstick: NEGATIVE
Ketones, ur: 15 mg/dL — AB
LEUKOCYTES UA: NEGATIVE
NITRITE: NEGATIVE
PROTEIN: NEGATIVE mg/dL
Specific Gravity, Urine: 1.031 — ABNORMAL HIGH (ref 1.005–1.030)
pH: 6.5 (ref 5.0–8.0)

## 2015-09-06 LAB — LIPASE, BLOOD: LIPASE: 20 U/L (ref 11–51)

## 2015-09-06 LAB — I-STAT CG4 LACTIC ACID, ED: LACTIC ACID, VENOUS: 1.17 mmol/L (ref 0.5–2.0)

## 2015-09-06 MED ORDER — UMECLIDINIUM BROMIDE 62.5 MCG/INH IN AEPB
1.0000 | INHALATION_SPRAY | Freq: Every day | RESPIRATORY_TRACT | Status: DC
  Administered 2015-09-07 – 2015-09-09 (×3): 1 via RESPIRATORY_TRACT
  Filled 2015-09-06: qty 7

## 2015-09-06 MED ORDER — POTASSIUM CHLORIDE IN NACL 40-0.9 MEQ/L-% IV SOLN
INTRAVENOUS | Status: AC
  Administered 2015-09-06: 75 mL/h via INTRAVENOUS
  Filled 2015-09-06: qty 1000

## 2015-09-06 MED ORDER — ACETAMINOPHEN 325 MG PO TABS
650.0000 mg | ORAL_TABLET | Freq: Four times a day (QID) | ORAL | Status: DC | PRN
  Administered 2015-09-08: 650 mg via ORAL
  Filled 2015-09-06: qty 2

## 2015-09-06 MED ORDER — SODIUM CHLORIDE 0.9 % IV BOLUS (SEPSIS)
1000.0000 mL | Freq: Once | INTRAVENOUS | Status: AC
  Administered 2015-09-06: 1000 mL via INTRAVENOUS

## 2015-09-06 MED ORDER — ACETAMINOPHEN 650 MG RE SUPP: 650.0000 mg | Freq: Four times a day (QID) | RECTAL | Status: DC | PRN

## 2015-09-06 MED ORDER — DIPHENHYDRAMINE HCL 50 MG/ML IJ SOLN
25.0000 mg | Freq: Every evening | INTRAMUSCULAR | Status: DC | PRN
  Administered 2015-09-06 – 2015-09-08 (×3): 50 mg via INTRAVENOUS
  Filled 2015-09-06 (×3): qty 1

## 2015-09-06 MED ORDER — FENTANYL CITRATE (PF) 100 MCG/2ML IJ SOLN
50.0000 ug | Freq: Once | INTRAMUSCULAR | Status: AC
  Administered 2015-09-06: 50 ug via INTRAVENOUS
  Filled 2015-09-06: qty 2

## 2015-09-06 MED ORDER — PANTOPRAZOLE SODIUM 40 MG IV SOLR
40.0000 mg | Freq: Two times a day (BID) | INTRAVENOUS | Status: DC
  Administered 2015-09-06 – 2015-09-09 (×6): 40 mg via INTRAVENOUS
  Filled 2015-09-06 (×6): qty 40

## 2015-09-06 MED ORDER — IOPAMIDOL (ISOVUE-300) INJECTION 61%
100.0000 mL | Freq: Once | INTRAVENOUS | Status: AC | PRN
  Administered 2015-09-06: 100 mL via INTRAVENOUS

## 2015-09-06 MED ORDER — IPRATROPIUM-ALBUTEROL 0.5-2.5 (3) MG/3ML IN SOLN: 3.0000 mL | RESPIRATORY_TRACT | Status: DC | PRN

## 2015-09-06 MED ORDER — POTASSIUM CHLORIDE IN NACL 40-0.9 MEQ/L-% IV SOLN: INTRAVENOUS | Status: DC

## 2015-09-06 MED ORDER — ONDANSETRON HCL 4 MG/2ML IJ SOLN: 4.0000 mg | Freq: Four times a day (QID) | INTRAMUSCULAR | Status: DC | PRN

## 2015-09-06 MED ORDER — DIATRIZOATE MEGLUMINE & SODIUM 66-10 % PO SOLN
90.0000 mL | Freq: Once | ORAL | Status: DC
  Filled 2015-09-06: qty 90

## 2015-09-06 MED ORDER — PROCHLORPERAZINE EDISYLATE 5 MG/ML IJ SOLN: 10.0000 mg | INTRAMUSCULAR | Status: DC | PRN

## 2015-09-06 MED ORDER — LEVOTHYROXINE SODIUM 100 MCG IV SOLR
44.0000 ug | Freq: Every day | INTRAVENOUS | Status: DC
  Administered 2015-09-07 – 2015-09-09 (×3): 44 ug via INTRAVENOUS
  Filled 2015-09-06 (×3): qty 5

## 2015-09-06 MED ORDER — MOMETASONE FURO-FORMOTEROL FUM 100-5 MCG/ACT IN AERO
2.0000 | INHALATION_SPRAY | Freq: Two times a day (BID) | RESPIRATORY_TRACT | Status: DC
  Administered 2015-09-06 – 2015-09-09 (×6): 2 via RESPIRATORY_TRACT
  Filled 2015-09-06: qty 8.8

## 2015-09-06 MED ORDER — CETYLPYRIDINIUM CHLORIDE 0.05 % MT LIQD
7.0000 mL | Freq: Two times a day (BID) | OROMUCOSAL | Status: DC
  Administered 2015-09-06 – 2015-09-09 (×5): 7 mL via OROMUCOSAL

## 2015-09-06 MED ORDER — ONDANSETRON HCL 4 MG PO TABS: 4.0000 mg | ORAL_TABLET | Freq: Four times a day (QID) | ORAL | Status: DC | PRN

## 2015-09-06 MED ORDER — ONDANSETRON HCL 4 MG/2ML IJ SOLN
4.0000 mg | INTRAMUSCULAR | Status: AC
  Administered 2015-09-06: 4 mg via INTRAVENOUS
  Filled 2015-09-06: qty 2

## 2015-09-06 MED ORDER — FENTANYL CITRATE (PF) 100 MCG/2ML IJ SOLN
25.0000 ug | INTRAMUSCULAR | Status: DC | PRN
  Filled 2015-09-06: qty 2

## 2015-09-06 NOTE — ED Notes (Signed)
Patient aware that a urine sample is needed, however she is unable to provide at this time. She will let us know when she is able.

## 2015-09-06 NOTE — Progress Notes (Signed)
MRN: [id number] Name: [name]  Sex: female Age: 82 y.o. DOB: 1930/04/19  [place] #: [name] Facility/Room: Level Of Care: SNF Provider: Inocencio Homes D Emergency Contacts: Extended Emergency Contact Information Primary Emergency Contact: [name] [address] [address]te Litter of [place] Phone: [phone] Work Phone: [phone] Mobile Phone: [phone] Relation: Daughter Secondary Emergency Contact: [name] [place] of [place] Phone: [phone] Relation: Neighbor  Code Status:   Allergies: Aspirin; Penicillins; Biaxin; Sulfonamide derivatives; Buprenorphine; Codeine; Hydrocodone; Morphine and related; Sulfa antibiotics; and Xanax  Chief Complaint  Patient presents with  . Acute Visit    HPI: Patient is 82 y.o. female with medical history significant of COPD, lung CA,  oxygen dependent on 2 L, anxiety, anemia, CAD, PVD, HTN, and carotid artery occlusion who has been admitted to SNF for OT/PT on 5/13 after mechanical fall with R hip fracture. Pt has onset of nausea yesterday and today has started vomiting. Pt has been taking percocet for pain, last percocet this am, but did have a good BM yesterday . No fever, chills, diarrhea. Pt with basin of greenish brown vomitus and vomited after abdominal exam.   Past Medical History  Diagnosis Date  . COPD (chronic obstructive pulmonary disease) ([place])   . Carotid artery occlusion   . Arthritis   . Anxiety     Afraid , living alone  . Myocardial infarction ([place]) 1987  . Anemia   . Bilateral leg pain     for years  . Femoral bruit   . Bronchitis, chronic ([place])   . Fall April 2014    Pt. has fallen twife in the last 2 months.   . Personal history of other diseases of digestive system     Upper GI bleed  . Generalized anxiety disorder   . Nonspecific abnormal electrocardiogram (ECG) (EKG)     Abnormal Results EKG  . Memory loss     Worsening  . Intestinal infection due to  Clostridium difficile   . Colitis due to Clostridium difficile   . Hypertension   . CAD (coronary artery disease)   . Thyroid disease     Hypo-Thyroidism    Past Surgical History  Procedure Laterality Date  . Carotid endarterectomy  12/30/2008  . Abdominal aortagram N/A 09/19/2012    Procedure: ABDOMINAL Maxcine Ham;  Surgeon: Elam Dutch, MD;  Location: [facility] C[facility] LAB;  Service: Cardiovascular;  Laterality: N/A;  . Lower extremity angiogram Bilateral 09/19/2012    Procedure: LOWER EXTREMITY ANGIOGRAM;  Surgeon: Elam Dutch, MD;  Location: [facility] CATH LAB;  Service: Cardiovascular;  Laterality: Bilateral;  . Femur im nail Right 08/17/2015    Procedure: RIGHT HIP INTRAMEDULLARY (IM) NAIL FEMORAL;  Surgeon: Mcarthur Rossetti, MD;  Location: [facility] ORS;  Service: Orthopedics;  Laterality: Right;      Medication List       This list is accurate as of: 09/06/15  4:38 PM.  Always use your most recent med list.               busPIRone 7.5 MG tablet  Commonly known as:  BUSPAR  Take 7.5 mg by mouth 2 (two) times daily.     citalopram 20 MG tablet  Commonly known as:  CELEXA  Take 20 mg by mouth daily.     docusate sodium 100 MG capsule  Commonly known as:  COLACE  Take 1 capsule (100 mg total) by mouth 2 (two) times daily.  ELIQUIS 2.5 MG Tabs tablet  Generic drug:  apixaban  Give 1 tablet by mouth twice daily x 10 days for DVT Prophylaxis STOP DATE 09/01/15     feeding supplement (ENSURE ENLIVE) Liqd  Take 237 mLs by mouth 3 (three) times daily between meals.     felodipine 5 MG 24 hr tablet  Commonly known as:  PLENDIL  Take 10 mg by mouth daily.     hydrOXYzine 25 MG tablet  Commonly known as:  ATARAX/VISTARIL  Take 25 mg by mouth daily as needed (sleep).     INCRUSE ELLIPTA 62.5 MCG/INH Aepb  Generic drug:  umeclidinium bromide  Inhale 1 puff into the lungs daily. Rinse mouth out with water after use. (COPD )     ipratropium-albuterol 0.5-2.5 (3) MG/3ML Soln   Commonly known as:  DUONEB  Take 3 mLs by nebulization every 6 (six) hours as needed (shortness of breath).     levothyroxine 88 MCG tablet  Commonly known as:  SYNTHROID, LEVOTHROID  Take 88 mcg by mouth daily.     mometasone-formoterol 100-5 MCG/ACT Aero  Commonly known as:  DULERA  Inhale 2 puffs into the lungs 2 (two) times daily.     nitroGLYCERIN 0.4 MG SL tablet  Commonly known as:  NITROSTAT  Place 0.4 mg under the tongue every 5 (five) minutes as needed for chest pain.     omeprazole 20 MG capsule  Commonly known as:  PRILOSEC  Take 1 capsule (20 mg total) by mouth daily.     oxyCODONE-acetaminophen 5-325 MG tablet  Commonly known as:  PERCOCET/ROXICET  Take 1 tablet by mouth every 8 (eight) hours as needed.     potassium chloride 10 MEQ CR capsule  Commonly known as:  MICRO-K  Take 10 mEq by mouth daily.     tiotropium 18 MCG inhalation capsule  Commonly known as:  SPIRIVA  Place 1 capsule (18 mcg total) into inhaler and inhale daily.     zolpidem 5 MG tablet  Commonly known as:  AMBIEN  Take 1 tablet (5 mg total) by mouth at bedtime as needed for sleep.        No orders of the defined types were placed in this encounter.    Immunization History  Administered Date(s) Administered  . Influenza Whole 01/07/2010    Social History  Substance Use Topics  . Smoking status: Former Smoker -- 15 years    Types: Cigarettes    Quit date: 04/09/1984  . Smokeless tobacco: Never Used  . Alcohol Use: No    Review of Systems  DATA OBTAINED: from patient, nurse, grandson GENERAL:  no fevers, fatigue, appetite changes SKIN: No itching, rash HEENT: No complaint RESPIRATORY: No cough, wheezing, SOB CARDIAC: No chest pain, palpitations, lower extremity edema  GI: No abdominal pain, + nausea/vomiting, no diarrhea or constipation, No heartburn or reflux  GU: No dysuria, frequency or urgency, or incontinence  MUSCULOSKELETAL: No unrelieved bone/joint  pain NEUROLOGIC: No headache, dizziness  PSYCHIATRIC: No overt anxiety or sadness  Filed Vitals:   09/06/15 1618  BP: 114/68  Pulse: 100  Temp: 97.6 F (36.4 C)  Resp: 18    Physical Exam  GENERAL APPEARANCE: Alert, conversant, appears uncomfortable SKIN: No diaphoresis rash HEENT: Unremarkable RESPIRATORY: Breathing is even, unlabored. Lung sounds are diffusely decreased  CARDIOVASCULAR: Heart RRR no murmurs, rubs or gallops. No peripheral edema  GASTROINTESTINAL: Abdomen is soft, mild distended rare  bowel sounds; can hear heart tones in abd; + involuntary  guarding RLQ, no rebound GENITOURINARY: Bladder mild  tender, not distended  MUSCULOSKELETAL: No abnormal joints or musculature NEUROLOGIC: Cranial nerves 2-12 grossly intact. Moves all extremities PSYCHIATRIC: Mood and affect appropriate to situation, no behavioral issues  Patient Active Problem List   Diagnosis Date Noted  . Anxiety 08/29/2015  . Lung cancer ([place]) 08/29/2015  . Hypertensive heart disease without CHF 08/29/2015  . Hip fracture ([place]) 08/17/2015  . Hip fracture, right ([place]) 08/17/2015  . Fall 08/17/2015  . Anemia 08/17/2015  . Insomnia 08/17/2015  . Acute blood loss anemia 05/31/2015  . Acute upper gastrointestinal bleeding 05/29/2015  . Leukocytosis 05/29/2015  . History of GI bleed 05/29/2015  . Acute encephalopathy 11/04/2013  . Noninfectious gastroenteritis and colitis 11/04/2013  . Protein-calorie malnutrition, severe ([place]) 11/02/2013  . Delirium 11/01/2013  . Altered mental state 11/01/2013  . COPD (chronic obstructive pulmonary disease) ([place]) 11/01/2013  . Chronic hypoxemic respiratory failure ([place]) 11/01/2013  . Toxic encephalopathy 10/03/2013  . UTI (urinary tract infection) 10/03/2013  . Acute-on-chronic respiratory failure ([place]) 10/03/2013  . Cerebral embolism with cerebral infarction ([place]) 09/18/2013  . Accidental overdose 09/14/2013  . Hypothermia 09/14/2013  . Spinal stenosis of  lumbar region at multiple levels 02/15/2011  . PULMONARY NODULE 02/22/2010  . Nonspecific (abnormal) findings on radiological and other examination of body structure 11/29/2008  . CT, CHEST, ABNORMAL 11/29/2008  . Hypothyroidism 06/25/2007  . HYPERCHOLESTEROLEMIA 06/25/2007  . ANEMIA 06/25/2007  . AMI 06/25/2007  . ANGINA, MILD 06/25/2007  . CAD (coronary artery disease) 06/25/2007  . HEMORRHOIDS, INTERNAL 06/25/2007  . HEMORRHOIDS, EXTERNAL 06/25/2007  . HEMOCCULT POSITIVE STOOL 06/25/2007  . PYELONEPHRITIS, ACUTE 06/25/2007  . COLONIC POLYPS, HYPERPLASTIC 10/15/1997  . DIVERTICULOSIS, COLON 10/15/1997    CBC    Component Value Date/Time   WBC 10.2 08/22/2015 0359   WBC 10.6* 09/28/2009 1422   RBC 3.01* 08/22/2015 0359   RBC 4.01 09/28/2009 1422   RBC 4.07 09/28/2009 1422   HGB 9.3* 08/22/2015 0359   HGB 9.4* 09/28/2009 1422   HCT 27.8* 08/22/2015 0359   HCT 29.1* 09/28/2009 1422   PLT 246 08/22/2015 0359   PLT 319 09/28/2009 1422   MCV 92.4 08/22/2015 0359   MCV 73* 09/28/2009 1422   LYMPHSABS 0.5* 08/17/2015 0109   LYMPHSABS 2.0 09/28/2009 1422   MONOABS 0.4 08/17/2015 0109   EOSABS 0.0 08/17/2015 0109   EOSABS 0.7* 09/28/2009 1422   BASOSABS 0.0 08/17/2015 0109   BASOSABS 0.0 09/28/2009 1422    CMP     Component Value Date/Time   NA 140 08/22/2015 0359   K 3.4* 08/22/2015 0359   CL 107 08/22/2015 0359   CO2 28 08/22/2015 0359   GLUCOSE 123* 08/22/2015 0359   BUN 16 08/22/2015 0359   CREATININE 0.61 08/22/2015 0359   CALCIUM 8.4* 08/22/2015 0359   PROT 6.3* 05/29/2015 1735   ALBUMIN 4.0 05/29/2015 1735   AST 21 05/29/2015 1735   ALT 13* 05/29/2015 1735   ALKPHOS 59 05/29/2015 1735   BILITOT 0.4 05/29/2015 1735   GFRNONAA >60 08/22/2015 0359   GFRAA >60 08/22/2015 0359    Assessment and Plan  ABDOMINAL PAIN WITH VOMITING -appears to be becoming an acute abdomen; (of note pt has had 3 C sections and reported to  have her appendix) ; sending pt to  ED   Time spent  >35 min;> 50% of time with patient was spent reviewing records, labs, tests and studies, counseling and developing plan of care  [place], Webb Silversmith  D, MD

## 2015-09-06 NOTE — ED Notes (Signed)
Pt in CT.

## 2015-09-06 NOTE — ED Notes (Signed)
Patient brought in by EMS from [facility], in rehab for hip fx, c/o nausea and vomiting over the weekend, and abd pain, was initially epigastric and LUQ, and has now migrated to RLQ. Unknown if she has her appendix. Patient states "uncomfortable" with palpation. Patient states the pain is 5/10, only hurts when "pushed on and moved around". Last BM yesterday, time unknown. Patient states the vomiting is bile, and the nausea was previously resolved after Zofran, although did return with movement.

## 2015-09-06 NOTE — Consult Note (Signed)
Reason for Consult:Small bowel obstruction  Referring Physician: Azriel Dancy Mittag is an 82 y.o. female.   HPI: We were asked by emergency department physician to evaluate this patient for repair of small bowel obstruction. She is 82 year old female with a significant recent history of a fall resulting in a hip fracture, status post right intramedullary nail on 08/17/2015. She has been recuperating in a rehabilitation facility.Of note is she has a history of COPD and had increasing shortness of breath and oxygen dependence postoperatively prompting a CT of the chest for PE which was negative for PE but did show several masses consistent with possible primary lung cancer.  These are still in the process of evaluation. She is a somewhat poor historian but per patient and her family and review of the notes from the nursing home she has had about 2 days of persistent bilious vomiting. She states she has had some intermittent pain but again is vague about this and this does not seem to be a major component. Possibly some abdominal distention. Had a normal bowel movement yesterday. She and her family deny any  Previous history of bowel obstruction. Previous surgery includes cesarean section through low midline incision and open appendectomy.  Past Medical History  Diagnosis Date  . COPD (chronic obstructive pulmonary disease) ([place])   . Carotid artery occlusion   . Arthritis   . Anxiety     Afraid , living alone  . Myocardial infarction ([place]) 1987  . Anemia   . Bilateral leg pain     for years  . Femoral bruit   . Bronchitis, chronic ([place])   . Fall April 2014    Pt. has fallen twife in the last 2 months.   . Personal history of other diseases of digestive system     Upper GI bleed  . Generalized anxiety disorder   . Nonspecific abnormal electrocardiogram (ECG) (EKG)     Abnormal Results EKG  . Memory loss     Worsening  . Intestinal infection due to Clostridium difficile   . Colitis  due to Clostridium difficile   . Hypertension   . CAD (coronary artery disease)   . Thyroid disease     Hypo-Thyroidism    Past Surgical History  Procedure Laterality Date  . Carotid endarterectomy  12/30/2008  . Abdominal aortagram N/A 09/19/2012    Procedure: ABDOMINAL Maxcine Ham;  Surgeon: Elam Dutch, MD;  Location: [facility] CATH LAB;  Service: Cardiovascular;  Laterality: N/A;  . Lower extremity angiogram Bilateral 09/19/2012    Procedure: LOWER EXTREMITY ANGIOGRAM;  Surgeon: Elam Dutch, MD;  Location: [facility] CATH LAB;  Service: Cardiovascular;  Laterality: Bilateral;  . Femur im nail Right 08/17/2015    Procedure: RIGHT HIP INTRAMEDULLARY (IM) NAIL FEMORAL;  Surgeon: Mcarthur Rossetti, MD;  Location: [facility] ORS;  Service: Orthopedics;  Laterality: Right;    Family History  Problem Relation Age of Onset  . Cancer Mother   . Deep vein thrombosis Mother   . Cancer Father   . Kidney disease Father   . Heart disease Father     Social History:  reports that she quit smoking about 31 years ago. Her smoking use included Cigarettes. She quit after 15 years of use. She has never used smokeless tobacco. She reports that she does not drink alcohol or use illicit drugs.  Allergies:  Allergies  Allergen Reactions  . Aspirin     GI  Bleed  . Penicillins     Numbness  around mouth  Has patient had a PCN reaction causing immediate rash, facial/tongue/throat swelling, SOB or lightheadedness with hypotension: Yes Has patient had a PCN reaction causing severe rash involving mucus membranes or skin necrosis: No Has patient had a PCN reaction that required hospitalization No Has patient had a PCN reaction occurring within the last 10 years: No If all of the above answers are "NO", then may proceed with Cephalosporin use.    . Biaxin [Clarithromycin] Nausea Only  . Sulfonamide Derivatives Nausea And Vomiting  . Buprenorphine Nausea And Vomiting  . Codeine Nausea And Vomiting  . Hydrocodone  Nausea And Vomiting  . Morphine And Related Nausea Only    Nausea   . Sulfa Antibiotics Nausea And Vomiting  . Xanax [Alprazolam]     unknown    Current Facility-Administered Medications  Medication Dose Route Frequency Provider Last Rate Last Dose  . prochlorperazine (COMPAZINE) injection 10 mg  10 mg Intravenous Q4H PRN Norval Morton, MD       Current Outpatient Prescriptions  Medication Sig Dispense Refill  . busPIRone (BUSPAR) 15 MG tablet Take 15 mg by mouth 2 (two) times daily.    . citalopram (CELEXA) 20 MG tablet Take 20 mg by mouth daily.    Marland Kitchen docusate sodium (COLACE) 100 MG capsule Take 1 capsule (100 mg total) by mouth 2 (two) times daily. 10 capsule 0  . feeding supplement, ENSURE ENLIVE, (ENSURE ENLIVE) LIQD Take 237 mLs by mouth 3 (three) times daily between meals. 237 mL 12  . felodipine (PLENDIL) 5 MG 24 hr tablet Take 10 mg by mouth daily.     . INCRUSE ELLIPTA 62.5 MCG/INH AEPB Inhale 1 puff into the lungs daily. Rinse mouth out with water after use. (COPD )  2  . levothyroxine (SYNTHROID, LEVOTHROID) 88 MCG tablet Take 88 mcg by mouth daily.  0  . methocarbamol (ROBAXIN) 500 MG tablet Take 1,000 mg by mouth every 6 (six) hours.    . mometasone-formoterol (DULERA) 100-5 MCG/ACT AERO Inhale 2 puffs into the lungs 2 (two) times daily. 1 Inhaler 1  . omeprazole (PRILOSEC) 20 MG capsule Take 1 capsule (20 mg total) by mouth daily. 30 capsule 2  . oxyCODONE-acetaminophen (PERCOCET/ROXICET) 5-325 MG tablet Take 1 tablet by mouth every 8 (eight) hours as needed. (Patient taking differently: Take 1 tablet by mouth every 6 (six) hours. ) 15 tablet 0  . potassium chloride (MICRO-K) 10 MEQ CR capsule Take 10 mEq by mouth daily.    Marland Kitchen tiotropium (SPIRIVA) 18 MCG inhalation capsule Place 1 capsule (18 mcg total) into inhaler and inhale daily. 30 capsule 0  . hydrOXYzine (ATARAX/VISTARIL) 25 MG tablet Take 25 mg by mouth daily as needed (sleep).     Marland Kitchen ipratropium-albuterol (DUONEB)  0.5-2.5 (3) MG/3ML SOLN Take 3 mLs by nebulization every 6 (six) hours as needed (shortness of breath).   0  . nitroGLYCERIN (NITROSTAT) 0.4 MG SL tablet Place 0.4 mg under the tongue every 5 (five) minutes as needed for chest pain.    Marland Kitchen zolpidem (AMBIEN) 5 MG tablet Take 1 tablet (5 mg total) by mouth at bedtime as needed for sleep. 30 tablet 0     Results for orders placed or performed during the hospital encounter of 09/06/15 (from the past 48 hour(s))  Lipase, blood     Status: None   Collection Time: 09/06/15  5:46 PM  Result Value Ref Range   Lipase 20 11 - 51 U/L  Comprehensive metabolic panel  Status: Abnormal   Collection Time: 09/06/15  5:46 PM  Result Value Ref Range   Sodium 139 135 - 145 mmol/L   Potassium 3.3 (L) 3.5 - 5.1 mmol/L   Chloride 99 (L) 101 - 111 mmol/L   CO2 29 22 - 32 mmol/L   Glucose, Bld 140 (H) 65 - 99 mg/dL   BUN 22 (H) 6 - 20 mg/dL   Creatinine, Ser 0.69 0.44 - 1.00 mg/dL   Calcium 9.2 8.9 - 10.3 mg/dL   Total Protein 6.9 6.5 - 8.1 g/dL   Albumin 3.9 3.5 - 5.0 g/dL   AST 26 15 - 41 U/L   ALT 20 14 - 54 U/L   Alkaline Phosphatase 105 38 - 126 U/L   Total Bilirubin 1.0 0.3 - 1.2 mg/dL   GFR calc non Af Amer >60 >60 mL/min   GFR calc Af Amer >60 >60 mL/min    Comment: (NOTE) The eGFR has been calculated using the CKD EPI equation. This calculation has not been validated in all clinical situations. eGFR's persistently <60 mL/min signify possible Chronic Kidney Disease.    Anion gap 11 5 - 15  CBC     Status: Abnormal   Collection Time: 09/06/15  5:46 PM  Result Value Ref Range   WBC 12.8 (H) 4.0 - 10.5 K/uL   RBC 3.96 3.87 - 5.11 MIL/uL   Hemoglobin 12.1 12.0 - 15.0 g/dL   HCT 37.3 36.0 - 46.0 %   MCV 94.2 78.0 - 100.0 fL   MCH 30.6 26.0 - 34.0 pg   MCHC 32.4 30.0 - 36.0 g/dL   RDW 14.3 11.5 - 15.5 %   Platelets 484 (H) 150 - 400 K/uL  Urinalysis, Routine w reflex microscopic     Status: Abnormal   Collection Time: 09/06/15  7:26 PM   Result Value Ref Range   Color, Urine YELLOW YELLOW   APPearance CLOUDY (A) CLEAR   Specific Gravity, Urine 1.031 (H) 1.005 - 1.030   pH 6.5 5.0 - 8.0   Glucose, UA NEGATIVE NEGATIVE mg/dL   Hgb urine dipstick NEGATIVE NEGATIVE   Bilirubin Urine SMALL (A) NEGATIVE   Ketones, ur 15 (A) NEGATIVE mg/dL   Protein, ur NEGATIVE NEGATIVE mg/dL   Nitrite NEGATIVE NEGATIVE   Leukocytes, UA NEGATIVE NEGATIVE    Comment: MICROSCOPIC NOT DONE ON URINES WITH NEGATIVE PROTEIN, BLOOD, LEUKOCYTES, NITRITE, OR GLUCOSE <1000 mg/dL.  I-Stat CG4 Lactic Acid, ED     Status: None   Collection Time: 09/06/15  8:01 PM  Result Value Ref Range   Lactic Acid, Venous 1.17 0.5 - 2.0 mmol/L    Ct Abdomen Pelvis W Contrast  09/06/2015  CLINICAL DATA:  Generalized abdominal pain with nausea and vomiting EXAM: CT ABDOMEN AND PELVIS WITH CONTRAST TECHNIQUE: Multidetector CT imaging of the abdomen and pelvis was performed using the standard protocol following bolus administration of intravenous contrast. CONTRAST:  160m ISOVUE-300 IOPAMIDOL (ISOVUE-300) INJECTION 61% COMPARISON:  Aug 22, 2015 FINDINGS: Lower chest: There is mild bibasilar atelectatic change. There is the pericardial effusion. There is atherosclerotic calcification in the distal aorta. Hepatobiliary: No focal liver lesions are identified. There is focal fatty infiltration near the fissure for the ligamentum teres. Small gallstones are noted within the gallbladder. The gallbladder wall is not thickened. Mild intrahepatic and extrahepatic biliary duct dilatation is stable compared to recent prior study. No biliary duct mass or calculus is evident. Pancreas: There is no demonstrable pancreatic mass or inflammatory focus. Pancreas is rather  atrophic, stable. Spleen: No splenic lesions are evident. Adrenals/Urinary Tract: Adrenals appear unremarkable bilaterally. Areas of scarring in the left kidney are stable. Multiple foci of arterial vascular calcification is  noted in each kidney. There is a cyst in the posterior lower pole right kidney measuring 7 x 7 mm. There is a 1 x 1 cm cyst in the lower pole left kidney. There is no hydronephrosis on either side. No renal or ureteral calculi are identified. Urinary bladder is midline with wall thickness within normal limits. Stomach/Bowel: There is dilatation of the stomach and proximal small bowel. There is a transition zone at the junction of the mid and distal portions of the jejunum consistent with a degree of small bowel obstruction. No free air or portal venous air. There is no appreciable bowel wall or mesenteric thickening. There are multiple sigmoid diverticula without diverticulitis. Vascular/Lymphatic: There is atherosclerotic calcification in the aorta and iliac arteries. Peripheral thrombus is noted in the distal aorta. There is no frank abdominal aortic aneurysm. Major mesenteric vessels appear patent. No adenopathy is apparent in the abdomen or pelvis. Reproductive: Uterus is anteverted. There is no pelvic mass. There is free fluid in the cul-de-sac region. Other: There is no abscess apparent in the abdomen or pelvis. There is no periappendiceal inflammation. Musculoskeletal: There is upper lumbar levoscoliosis. There is extensive arthropathy in the lumbar spine. There is vacuum phenomenon at L4-5 and L5-S1. There is evidence of a prior comminuted right proximal femur fracture with screw and plate fixation in this area. No acute fracture is evident. Bones are osteoporotic. There are no evident blastic or lytic bone lesions. There is no intramuscular or abdominal wall lesion. IMPRESSION: Small bowel obstruction with transition zone at the level of the mid to distal jejunum. No free air. Multiple sigmoid diverticula without diverticulitis. Mild ascites in the dependent portion of the pelvis.  No abscess. Small gallstones within the gallbladder. Stable biliary duct dilatation without biliary duct mass or calculus seen.  Atrophic pancreas without focal pancreatic lesion. Extensive atherosclerotic calcification. Prior right proximal femur fracture with postoperative change. Bones osteoporotic. Extensive arthropathy in the lumbar region, most severe at L4-5 and L5-S1. These results were called by telephone at the time of interpretation on 09/06/2015 at 7:33 pm to [facility] , who verbally acknowledged these results. Electronically Signed   By: Lowella Grip III M.D.   On: 09/06/2015 19:34    Review of Systems  Constitutional: Negative for fever and chills.  Respiratory: Positive for shortness of breath. Negative for wheezing.   Cardiovascular: Negative for chest pain and leg swelling.  Gastrointestinal: Positive for nausea, vomiting and abdominal pain.  Musculoskeletal: Positive for joint pain and falls.   Blood pressure 168/78, pulse 104, temperature 98.4 F (36.9 C), temperature source Oral, resp. rate 20, SpO2 98 %. Physical Exam General: Alert, Chronically ill-appearing elderly Caucasian female, in no distress Skin: Warm and dry without rash or infection. HEENT: No palpable masses or thyromegaly. Sclera nonicteric. Pupils equal round and reactive.  Lymph nodes: No cervical, supraclavicular, or inguinal nodes palpable. Lungs: Breath sounds distant and equal without increased work of breathing Or wheezing Cardiovascular: Regular rate and rhythm without murmur. S3 gallop No JVD or edema. Peripheral pulses intact. Abdomen: Mild to moderate distention. Soft With mild generalized tenderness, no guarding. No masses palpable. No organomegaly.Tiny reducible umbilical hernia nontender. Well-healed low midline and right lower quadrant incisions. Extremities: No edema.. No chronic venous stasis changes. Neurologic: Alert and Oriented to person but poorly  to place and situation in time. Moves all 4 extremities with good strength.  Assessment/Plan: Small bowel obstruction, likely secondary to adhesions.  Patient with multiple ongoing medical issues including COPD, Hypertension, coronary artery disease and  Peripheral vascular disease and recent  Hip fracture in rehabilitation and possible new diagnosis of lung cancer. CT scan 2 weeks ago as metastatic workup did not show any obvious disease in the abdomen. Malignant obstruction from a metastatic deposit remains possible.  No indications for emergency surgery present. NG tube replaced and will give Gastrografin for small bowel obstruction protocol. Discussed with the patient and family all questions answered.  Jazir Newey T 09/06/2015, 8:57 PM

## 2015-09-06 NOTE — Progress Notes (Signed)
Patient noted to have been admitted and discharged from the hospital from 05/09 to 05/15 post fall with fracture to right hip.  Patient discharged to [facility].  Patient presents to ED from [facility] with n/v abdominal pain.  CT of abdomen revealing bowel obstruction.

## 2015-09-06 NOTE — ED Provider Notes (Signed)
CSN: [id number]     Arrival date & time 09/06/15  1656 History   First MD Initiated Contact with Patient 09/06/15 1715     Chief Complaint  Patient presents with  . Abdominal Pain    [name] is a 82 y.o. female coming from [facility] after she broke her right hip about 20 days ago who presents to the ED complaining of two days of nausea, vomiting and abdominal pain. The patient currently complains of moderate left upper quadrant and right lower quadrant abdominal pain. She reports the pain is worse with palpation. She had a normal bowel movement yesterday. No diarrhea. She reports 3-4 episodes of vomiting today. She reports slight nausea currently. No hematemesis or hematochezia. She reports 3 cesarean sections as her only previous surgeries. Last BM was two days ago, she is not sure if she is passing much gas. She reports feeling thirsty. She is DNR. She denies fevers, diarrhea, urinary symptoms, hematuria, hematochezia, cough, CP, SOB or rashes.   Patient is a 82 y.o. female presenting with abdominal pain. The history is provided by the patient, medical records and a relative. No language interpreter was used.  Abdominal Pain Associated symptoms: nausea and vomiting   Associated symptoms: no chest pain, no chills, no cough, no diarrhea, no dysuria, no fever, no hematuria, no shortness of breath, no sore throat and no vaginal bleeding     Past Medical History  Diagnosis Date  . COPD (chronic obstructive pulmonary disease) ([place])   . Carotid artery occlusion   . Arthritis   . Anxiety     Afraid , living alone  . Myocardial infarction ([place]) 1987  . Anemia   . Bilateral leg pain     for years  . Femoral bruit   . Bronchitis, chronic ([place])   . Fall April 2014    Pt. has fallen twife in the last 2 months.   . Personal history of other diseases of digestive system     Upper GI bleed  . Generalized anxiety disorder   . Nonspecific abnormal electrocardiogram (ECG) (EKG)      Abnormal Results EKG  . Memory loss     Worsening  . Intestinal infection due to Clostridium difficile   . Colitis due to Clostridium difficile   . Hypertension   . CAD (coronary artery disease)   . Thyroid disease     Hypo-Thyroidism   Past Surgical History  Procedure Laterality Date  . Carotid endarterectomy  12/30/2008  . Abdominal aortagram N/A 09/19/2012    Procedure: ABDOMINAL Maxcine Ham;  Surgeon: Elam Dutch, MD;  Location: [facility] CATH LAB;  Service: Cardiovascular;  Laterality: N/A;  . Lower extremity angiogram Bilateral 09/19/2012    Procedure: LOWER EXTREMITY ANGIOGRAM;  Surgeon: Elam Dutch, MD;  Location: [facility] CATH LAB;  Service: Cardiovascular;  Laterality: Bilateral;  . Femur im nail Right 08/17/2015    Procedure: RIGHT HIP INTRAMEDULLARY (IM) NAIL FEMORAL;  Surgeon: Mcarthur Rossetti, MD;  Location: [facility] ORS;  Service: Orthopedics;  Laterality: Right;   Family History  Problem Relation Age of Onset  . Cancer Mother   . Deep vein thrombosis Mother   . Cancer Father   . Kidney disease Father   . Heart disease Father    Social History  Substance Use Topics  . Smoking status: Former Smoker -- 15 years    Types: Cigarettes    Quit date: 04/09/1984  . Smokeless tobacco: Never Used  . Alcohol Use: No  OB History    No data available     Review of Systems  Constitutional: Negative for fever and chills.  HENT: Negative for congestion and sore throat.   Eyes: Negative for visual disturbance.  Respiratory: Negative for cough and shortness of breath.   Cardiovascular: Negative for chest pain.  Gastrointestinal: Positive for nausea, vomiting and abdominal pain. Negative for diarrhea, blood in stool, abdominal distention and rectal pain.  Genitourinary: Negative for dysuria, urgency, frequency, hematuria, vaginal bleeding and difficulty urinating.  Musculoskeletal: Positive for arthralgias. Negative for back pain and neck pain.  Skin: Negative for rash.   Neurological: Negative for syncope, light-headedness and headaches.      Allergies  Aspirin; Penicillins; Biaxin; Sulfonamide derivatives; Buprenorphine; Codeine; Hydrocodone; Morphine and related; Sulfa antibiotics; and Xanax  Home Medications   Prior to Admission medications   Medication Sig Start Date End Date Taking? Authorizing Provider  busPIRone (BUSPAR) 15 MG tablet Take 15 mg by mouth 2 (two) times daily.   Yes Historical Provider, MD  citalopram (CELEXA) 20 MG tablet Take 20 mg by mouth daily.   Yes Historical Provider, MD  docusate sodium (COLACE) 100 MG capsule Take 1 capsule (100 mg total) by mouth 2 (two) times daily. 08/22/15  Yes Kelvin Cellar, MD  feeding supplement, ENSURE ENLIVE, (ENSURE ENLIVE) LIQD Take 237 mLs by mouth 3 (three) times daily between meals. 05/31/15  Yes Annita Brod, MD  felodipine (PLENDIL) 5 MG 24 hr tablet Take 10 mg by mouth daily.    Yes Historical Provider, MD  INCRUSE ELLIPTA 62.5 MCG/INH AEPB Inhale 1 puff into the lungs daily. Rinse mouth out with water after use. (COPD ) 08/12/15  Yes Historical Provider, MD  levothyroxine (SYNTHROID, LEVOTHROID) 88 MCG tablet Take 88 mcg by mouth daily. 08/12/15  Yes Historical Provider, MD  methocarbamol (ROBAXIN) 500 MG tablet Take 1,000 mg by mouth every 6 (six) hours.   Yes Historical Provider, MD  mometasone-formoterol (DULERA) 100-5 MCG/ACT AERO Inhale 2 puffs into the lungs 2 (two) times daily. 08/22/15  Yes Kelvin Cellar, MD  omeprazole (PRILOSEC) 20 MG capsule Take 1 capsule (20 mg total) by mouth daily. 05/31/15  Yes Annita Brod, MD  oxyCODONE-acetaminophen (PERCOCET/ROXICET) 5-325 MG tablet Take 1 tablet by mouth every 8 (eight) hours as needed. Patient taking differently: Take 1 tablet by mouth every 6 (six) hours.  08/22/15  Yes Kelvin Cellar, MD  potassium chloride (MICRO-K) 10 MEQ CR capsule Take 10 mEq by mouth daily.   Yes Historical Provider, MD  tiotropium (SPIRIVA) 18 MCG  inhalation capsule Place 1 capsule (18 mcg total) into inhaler and inhale daily. 11/04/13  Yes Eugenie Filler, MD  hydrOXYzine (ATARAX/VISTARIL) 25 MG tablet Take 25 mg by mouth daily as needed (sleep).  05/28/15   Historical Provider, MD  ipratropium-albuterol (DUONEB) 0.5-2.5 (3) MG/3ML SOLN Take 3 mLs by nebulization every 6 (six) hours as needed (shortness of breath).  03/10/15   Historical Provider, MD  nitroGLYCERIN (NITROSTAT) 0.4 MG SL tablet Place 0.4 mg under the tongue every 5 (five) minutes as needed for chest pain.    Historical Provider, MD  zolpidem (AMBIEN) 5 MG tablet Take 1 tablet (5 mg total) by mouth at bedtime as needed for sleep. 05/31/15   Annita Brod, MD   BP 168/78 mmHg  Pulse 104  Temp(Src) 98.4 F (36.9 C) (Oral)  Resp 20  SpO2 98% Physical Exam  Constitutional: She appears well-developed and well-nourished. No distress.  Nontoxic appearing.  HENT:  Head: Normocephalic and atraumatic.  Right Ear: External ear normal.  Left Ear: External ear normal.  Mouth/Throat: Oropharynx is clear and moist.  Mucous membranes appear dry.  Eyes: Conjunctivae are normal. Pupils are equal, round, and reactive to light. Right eye exhibits no discharge. Left eye exhibits no discharge.  Neck: Neck supple.  Cardiovascular: Normal rate, regular rhythm, normal heart sounds and intact distal pulses.  Exam reveals no gallop and no friction rub.   No murmur heard. Heart rate is 98.  Pulmonary/Chest: Effort normal and breath sounds normal. No respiratory distress. She has no wheezes. She has no rales.  Abdominal: Soft. Bowel sounds are normal. She exhibits no distension. There is tenderness. There is no rebound and no guarding.  Abdomen is soft. Bowel sounds are present. Patient has moderate left upper quadrant, LLQ and right lower quadrant tenderness to palpation.   Musculoskeletal: She exhibits tenderness. She exhibits no edema.  Some tenderness noted to her right lateral hip.   Lymphadenopathy:    She has no cervical adenopathy.  Neurological: She is alert. Coordination normal.  Skin: Skin is warm and dry. No rash noted. She is not diaphoretic. No erythema. No pallor.  Psychiatric: She has a normal mood and affect. Her behavior is normal.  Nursing note and vitals reviewed.   ED Course  Procedures (including critical care time) Labs Review Labs Reviewed  COMPREHENSIVE METABOLIC PANEL - Abnormal; Notable for the following:    Potassium 3.3 (*)    Chloride 99 (*)    Glucose, Bld 140 (*)    BUN 22 (*)    All other components within normal limits  CBC - Abnormal; Notable for the following:    WBC 12.8 (*)    Platelets 484 (*)    All other components within normal limits  URINALYSIS, ROUTINE W REFLEX MICROSCOPIC (NOT AT [facility]) - Abnormal; Notable for the following:    APPearance CLOUDY (*)    Specific Gravity, Urine 1.031 (*)    Bilirubin Urine SMALL (*)    Ketones, ur 15 (*)    All other components within normal limits  LIPASE, BLOOD  I-STAT CG4 LACTIC ACID, ED    Imaging Review Ct Abdomen Pelvis W Contrast  09/06/2015  CLINICAL DATA:  Generalized abdominal pain with nausea and vomiting EXAM: CT ABDOMEN AND PELVIS WITH CONTRAST TECHNIQUE: Multidetector CT imaging of the abdomen and pelvis was performed using the standard protocol following bolus administration of intravenous contrast. CONTRAST:  156m ISOVUE-300 IOPAMIDOL (ISOVUE-300) INJECTION 61% COMPARISON:  Aug 22, 2015 FINDINGS: Lower chest: There is mild bibasilar atelectatic change. There is the pericardial effusion. There is atherosclerotic calcification in the distal aorta. Hepatobiliary: No focal liver lesions are identified. There is focal fatty infiltration near the fissure for the ligamentum teres. Small gallstones are noted within the gallbladder. The gallbladder wall is not thickened. Mild intrahepatic and extrahepatic biliary duct dilatation is stable compared to recent prior study. No biliary  duct mass or calculus is evident. Pancreas: There is no demonstrable pancreatic mass or inflammatory focus. Pancreas is rather atrophic, stable. Spleen: No splenic lesions are evident. Adrenals/Urinary Tract: Adrenals appear unremarkable bilaterally. Areas of scarring in the left kidney are stable. Multiple foci of arterial vascular calcification is noted in each kidney. There is a cyst in the posterior lower pole right kidney measuring 7 x 7 mm. There is a 1 x 1 cm cyst in the lower pole left kidney. There is no hydronephrosis on either side. No renal or  ureteral calculi are identified. Urinary bladder is midline with wall thickness within normal limits. Stomach/Bowel: There is dilatation of the stomach and proximal small bowel. There is a transition zone at the junction of the mid and distal portions of the jejunum consistent with a degree of small bowel obstruction. No free air or portal venous air. There is no appreciable bowel wall or mesenteric thickening. There are multiple sigmoid diverticula without diverticulitis. Vascular/Lymphatic: There is atherosclerotic calcification in the aorta and iliac arteries. Peripheral thrombus is noted in the distal aorta. There is no frank abdominal aortic aneurysm. Major mesenteric vessels appear patent. No adenopathy is apparent in the abdomen or pelvis. Reproductive: Uterus is anteverted. There is no pelvic mass. There is free fluid in the cul-de-sac region. Other: There is no abscess apparent in the abdomen or pelvis. There is no periappendiceal inflammation. Musculoskeletal: There is upper lumbar levoscoliosis. There is extensive arthropathy in the lumbar spine. There is vacuum phenomenon at L4-5 and L5-S1. There is evidence of a prior comminuted right proximal femur fracture with screw and plate fixation in this area. No acute fracture is evident. Bones are osteoporotic. There are no evident blastic or lytic bone lesions. There is no intramuscular or abdominal wall  lesion. IMPRESSION: Small bowel obstruction with transition zone at the level of the mid to distal jejunum. No free air. Multiple sigmoid diverticula without diverticulitis. Mild ascites in the dependent portion of the pelvis.  No abscess. Small gallstones within the gallbladder. Stable biliary duct dilatation without biliary duct mass or calculus seen. Atrophic pancreas without focal pancreatic lesion. Extensive atherosclerotic calcification. Prior right proximal femur fracture with postoperative change. Bones osteoporotic. Extensive arthropathy in the lumbar region, most severe at L4-5 and L5-S1. These results were called by telephone at the time of interpretation on 09/06/2015 at 7:33 pm to [facility] , who verbally acknowledged these results. Electronically Signed   By: Lowella Grip III M.D.   On: 09/06/2015 19:34   I have personally reviewed and evaluated these images and lab results as part of my medical decision-making.   EKG Interpretation None      Filed Vitals:   09/06/15 1706 09/06/15 1709 09/06/15 1958  BP: 151/74  168/78  Pulse: 99  104  Temp: 98.4 F (36.9 C)    TempSrc: Oral    Resp: 16  20  SpO2: 94% 96% 98%     MDM   Meds given in ED:  Medications  ondansetron (ZOFRAN) injection 4 mg (not administered)  fentaNYL (SUBLIMAZE) injection 50 mcg (not administered)  sodium chloride 0.9 % bolus 1,000 mL (not administered)  sodium chloride 0.9 % bolus 1,000 mL (0 mLs Intravenous Stopped 09/06/15 1953)  iopamidol (ISOVUE-300) 61 % injection 100 mL (100 mLs Intravenous Contrast Given 09/06/15 1857)    New Prescriptions   No medications on file    Final diagnoses:  SBO (small bowel obstruction) ([place])   This is a 82 y.o. female coming from [facility] after she broke her right hip about 20 days ago who presents to the ED complaining of two days of nausea, vomiting and abdominal pain. The patient currently complains of moderate left upper quadrant and right  lower quadrant abdominal pain. She reports the pain is worse with palpation. She had a normal bowel movement yesterday. No diarrhea. She reports 3-4 episodes of vomiting today. She reports slight nausea currently. No hematemesis or hematochezia. She reports 3 cesarean sections as her only previous surgeries. Last BM was two  days ago, she is not sure if she is passing much gas. On exam the patient is afebrile and nontoxic appearing. She has left upper quadrant and right lower quadrant tenderness to palpation on exam. Mucous membranes appear dry. CBC reveals a white count of 12,000. Hemoglobin is stable. CT abdomen and pelvis reveals a small bowel obstruction with a transition zone at the level of the mid to distal jejunum. I consulted with general surgeon Dr. Excell Seltzer who would like the patient admitted by medicine and he will be by to see the patient shortly.  I spoke with the patient and family and she agrees to NG tube and will speak with Dr. Excell Seltzer about surgery.  She agrees with plan for admission.  I consulted with Dr. Tamala Julian who accepted the patient for admission.    This patient was discussed with and evaluated by Dr. Alfonse Spruce who agrees with assessment and plan.   Waynetta Pean, PA-C 09/06/15 2033  Harvel Quale, MD 09/10/15 [phone]

## 2015-09-06 NOTE — H&P (Signed)
History and Physical    [name] PQZ:[id number] DOB: 1931/01/05 DOA: 09/06/2015  Referring MD/NP/PA: Waynetta Pean, PA-C PCP: Smothers, Andree Elk, NP  Patient coming from: [name]  Chief Complaint: Nausea, vomiting, and abdominal pain  HPI: [name] is a 82 y.o. female with medical history significant of COPD, suspected lung CA, oxygen dependent on 2 L, anxiety, chronic [place] anemia, CAD, PVD, HTN, and carotid artery occlusion; who presents with complaints of a 2 day history of nausea, vomiting, and abdominal pain. recently been admitted (5/9 -5/15)after sustaining a right hip fracture after a fall. Subsequently, she had [name]. Patient had been doing well getting up during rehabilitation activities. She reports utilizing Percocet as needed to help with pain. Does not appear that she had been on any particular bowel regimen. Symptoms started yesterday with nausea and some mild epigastric abdominal pain. Last bowel movement was approximately 2-3 days ago (5/27). Symptoms progressively worsened to the point where she started having nausea and vomiting. Emesis to be greenish brown in color. Today was evaluated by rehabilitation facility physician who transferred her to the emergency department for further evaluation. Abdominal surgeries include 3 C-sections and apparently still has appendix. Patient denies any dysuria, fever, chills, worsening of shortness of breath, chest pain, or leg swelling.   ED Course: Upon admission patient was evaluated and seen to be afebrile, with mild tachycardia( HR up to 104), and otherwise all other vital signs unremarkable. Initial lab work revealed WBC 12.8, hemoglobin 12.1, platelets 484, potassium 3.3, chloride 99, CO2 29, BUN 22, creatinine 0.69, and glucose 140. Urinalysis was cloudy with no signs of infection and increased specific gravity. CT of abdomen revealed a small bowel obstruction with  transition zone at the level of the mid to distal jejunum. General surgery was consulted to evaluate the patient and recommended NG tube, bowel rest, and  TRH to admit.   Review of Systems: As per HPI otherwise 10 point review of systems negative.   Past Medical History  Diagnosis Date  . COPD (chronic obstructive pulmonary disease) ([place])   . Carotid artery occlusion   . Arthritis   . Anxiety     Afraid , living alone  . Myocardial infarction ([place]) 1987  . Anemia   . Bilateral leg pain     for years  . Femoral bruit   . Bronchitis, chronic ([place])   . Fall April 2014    Pt. has fallen twife in the last 2 months.   . Personal history of other diseases of digestive system     Upper GI bleed  . Generalized anxiety disorder   . Nonspecific abnormal electrocardiogram (ECG) (EKG)     Abnormal Results EKG  . Memory loss     Worsening  . Intestinal infection due to Clostridium difficile   . Colitis due to Clostridium difficile   . Hypertension   . CAD (coronary artery disease)   . Thyroid disease     Hypo-Thyroidism    Past Surgical History  Procedure Laterality Date  . Carotid endarterectomy  12/30/2008  . Abdominal aortagram N/A 09/19/2012    Procedure: ABDOMINAL [name];  Surgeon: Kaitlin Dutch, MD;  Location: [facility] CATH LAB;  Service: Cardiovascular;  Laterality: N/A;  . Lower extremity angiogram Bilateral 09/19/2012    Procedure: LOWER EXTREMITY ANGIOGRAM;  Surgeon: Kaitlin Dutch, MD;  Location: [facility] CATH LAB;  Service: Cardiovascular;  Laterality: Bilateral;  . Femur im nail Right 08/17/2015  Procedure: RIGHT HIP INTRAMEDULLARY (IM) NAIL FEMORAL;  Surgeon: Kaitlin Rossetti, MD;  Location: [facility] ORS;  Service: Orthopedics;  Laterality: Right;     reports that she quit smoking about 31 years ago. Her smoking use included Cigarettes. She quit after 15 years of use. She has never used smokeless tobacco. She reports that she does not drink alcohol or use illicit  drugs.  Allergies  Allergen Reactions  . Aspirin     GI  Bleed  . Penicillins     Numbness around mouth  Has patient had a PCN reaction causing immediate rash, facial/tongue/throat swelling, SOB or lightheadedness with hypotension: Yes Has patient had a PCN reaction causing severe rash involving mucus membranes or skin necrosis: No Has patient had a PCN reaction that required hospitalization No Has patient had a PCN reaction occurring within the last 10 years: No If all of the above answers are "NO", then may proceed with Cephalosporin use.    . Biaxin [Clarithromycin] Nausea Only  . Sulfonamide Derivatives Nausea And Vomiting  . Buprenorphine Nausea And Vomiting  . Codeine Nausea And Vomiting  . Hydrocodone Nausea And Vomiting  . Morphine And Related Nausea Only    Nausea   . Sulfa Antibiotics Nausea And Vomiting  . Xanax [Alprazolam]     unknown    Family History  Problem Relation Age of Onset  . Cancer Mother   . Deep vein thrombosis Mother   . Cancer Father   . Kidney disease Father   . Heart disease Father     Prior to Admission medications   Medication Sig Start Date End Date Taking? Authorizing Provider  busPIRone (BUSPAR) 15 MG tablet Take 15 mg by mouth 2 (two) times daily.   Yes Historical Provider, MD  citalopram (CELEXA) 20 MG tablet Take 20 mg by mouth daily.   Yes Historical Provider, MD  docusate sodium (COLACE) 100 MG capsule Take 1 capsule (100 mg total) by mouth 2 (two) times daily. 08/22/15  Yes Kaitlin Cellar, MD  feeding supplement, ENSURE ENLIVE, (ENSURE ENLIVE) LIQD Take 237 mLs by mouth 3 (three) times daily between meals. 05/31/15  Yes Kaitlin Brod, MD  felodipine (PLENDIL) 5 MG 24 hr tablet Take 10 mg by mouth daily.    Yes Historical Provider, MD  INCRUSE ELLIPTA 62.5 MCG/INH AEPB Inhale 1 puff into the lungs daily. Rinse mouth out with water after use. (COPD ) 08/12/15  Yes Historical Provider, MD  levothyroxine (SYNTHROID, LEVOTHROID) 88 MCG  tablet Take 88 mcg by mouth daily. 08/12/15  Yes Historical Provider, MD  methocarbamol (ROBAXIN) 500 MG tablet Take 1,000 mg by mouth every 6 (six) hours.   Yes Historical Provider, MD  mometasone-formoterol (DULERA) 100-5 MCG/ACT AERO Inhale 2 puffs into the lungs 2 (two) times daily. 08/22/15  Yes Kaitlin Cellar, MD  omeprazole (PRILOSEC) 20 MG capsule Take 1 capsule (20 mg total) by mouth daily. 05/31/15  Yes Kaitlin Brod, MD  oxyCODONE-acetaminophen (PERCOCET/ROXICET) 5-325 MG tablet Take 1 tablet by mouth every 8 (eight) hours as needed. Patient taking differently: Take 1 tablet by mouth every 6 (six) hours.  08/22/15  Yes Kaitlin Cellar, MD  potassium chloride (MICRO-K) 10 MEQ CR capsule Take 10 mEq by mouth daily.   Yes Historical Provider, MD  tiotropium (SPIRIVA) 18 MCG inhalation capsule Place 1 capsule (18 mcg total) into inhaler and inhale daily. 11/04/13  Yes Eugenie Filler, MD  hydrOXYzine (ATARAX/VISTARIL) 25 MG tablet Take 25 mg by mouth daily as  needed (sleep).  05/28/15   Historical Provider, MD  ipratropium-albuterol (DUONEB) 0.5-2.5 (3) MG/3ML SOLN Take 3 mLs by nebulization every 6 (six) hours as needed (shortness of breath).  03/10/15   Historical Provider, MD  nitroGLYCERIN (NITROSTAT) 0.4 MG SL tablet Place 0.4 mg under the tongue every 5 (five) minutes as needed for chest pain.    Historical Provider, MD  zolpidem (AMBIEN) 5 MG tablet Take 1 tablet (5 mg total) by mouth at bedtime as needed for sleep. 05/31/15   Kaitlin Brod, MD    Physical Exam: Filed Vitals:   09/06/15 1706 09/06/15 1709 09/06/15 1958  BP: 151/74  168/78  Pulse: 99  104  Temp: 98.4 F (36.9 C)    TempSrc: Oral    Resp: 16  20  SpO2: 94% 96% 98%      Constitutional: Elderly female who appears acutely sick, but nontoxic Filed Vitals:   09/06/15 1706 09/06/15 1709 09/06/15 1958  BP: 151/74  168/78  Pulse: 99  104  Temp: 98.4 F (36.9 C)    TempSrc: Oral    Resp: 16  20  SpO2: 94%  96% 98%   Eyes: PERRL, lids  and conjunctivae normal ENMT: Mucous membranes are dry, Posterior pharynx clear of any exudate or lesions. Neck: normal, supple, no masses, no thyromegaly Respiratory: Bibasilar crackles appreciated, no wheezing, no crackles. Normal respiratory effort. No accessory muscle use.  Cardiovascular: Regular rate and rhythm, no murmurs / rubs / gallops. No extremity edema. 2+ pedal pulses. No carotid bruits.  Abdomen: Generalized tenderness to palpation, no masses palpated. No hepatosplenomegaly. Bowel sounds decreased  Musculoskeletal: no clubbing / cyanosis. No joint deformity upper and lower extremities. Good ROM, no contractures. Normal muscle tone.  Skin: no rashes, lesions, ulcers. No induration Neurologic: CN 2-12 grossly intact. Sensation intact, DTR normal. Strength 5/5 in all 4.  Psychiatric: Normal judgment and insight. Alert and oriented x 3. Depressed appearing mood    Labs on Admission: I have personally reviewed following labs and imaging studies  CBC:  Recent Labs Lab 09/06/15 1746  WBC 12.8*  HGB 12.1  HCT 37.3  MCV 94.2  PLT 623*   Basic Metabolic Panel:  Recent Labs Lab 09/06/15 1746  NA 139  K 3.3*  CL 99*  CO2 29  GLUCOSE 140*  BUN 22*  CREATININE 0.69  CALCIUM 9.2   GFR: Estimated Creatinine Clearance: 38.9 mL/min (by C-G formula based on Cr of 0.69). Liver Function Tests:  Recent Labs Lab 09/06/15 1746  AST 26  ALT 20  ALKPHOS 105  BILITOT 1.0  PROT 6.9  ALBUMIN 3.9    Recent Labs Lab 09/06/15 1746  LIPASE 20   No results for input(s): AMMONIA in the last 168 hours. Coagulation Profile: No results for input(s): INR, PROTIME in the last 168 hours. Cardiac Enzymes: No results for input(s): CKTOTAL, CKMB, CKMBINDEX, TROPONINI in the last 168 hours. BNP (last 3 results) No results for input(s): PROBNP in the last 8760 hours. HbA1C: No results for input(s): HGBA1C in the last 72 hours. CBG: No results for  input(s): GLUCAP in the last 168 hours. Lipid Profile: No results for input(s): CHOL, HDL, LDLCALC, TRIG, CHOLHDL, LDLDIRECT in the last 72 hours. Thyroid Function Tests: No results for input(s): TSH, T4TOTAL, FREET4, T3FREE, THYROIDAB in the last 72 hours. Anemia Panel: No results for input(s): VITAMINB12, FOLATE, FERRITIN, TIBC, IRON, RETICCTPCT in the last 72 hours. Urine analysis:    Component Value Date/Time   COLORURINE YELLOW  09/06/2015 1926   APPEARANCEUR CLOUDY* 09/06/2015 1926   LABSPEC 1.031* 09/06/2015 1926   PHURINE 6.5 09/06/2015 1926   GLUCOSEU NEGATIVE 09/06/2015 1926   HGBUR NEGATIVE 09/06/2015 1926   BILIRUBINUR SMALL* 09/06/2015 1926   KETONESUR 15* 09/06/2015 1926   PROTEINUR NEGATIVE 09/06/2015 1926   UROBILINOGEN 0.2 11/01/2013 1731   NITRITE NEGATIVE 09/06/2015 1926   LEUKOCYTESUR NEGATIVE 09/06/2015 1926   Sepsis Labs: No results found for this or any previous visit (from the past 240 hour(s)).   Radiological Exams on Admission: Ct Abdomen Pelvis W Contrast  09/06/2015  CLINICAL DATA:  Generalized abdominal pain with nausea and vomiting EXAM: CT ABDOMEN AND PELVIS WITH CONTRAST TECHNIQUE: Multidetector CT imaging of the abdomen and pelvis was performed using the standard protocol following bolus administration of intravenous contrast. CONTRAST:  142m ISOVUE-300 IOPAMIDOL (ISOVUE-300) INJECTION 61% COMPARISON:  Aug 22, 2015 FINDINGS: Lower chest: There is mild bibasilar atelectatic change. There is the pericardial effusion. There is atherosclerotic calcification in the distal aorta. Hepatobiliary: No focal liver lesions are identified. There is focal fatty infiltration near the fissure for the ligamentum teres. Small gallstones are noted within the gallbladder. The gallbladder wall is not thickened. Mild intrahepatic and extrahepatic biliary duct dilatation is stable compared to recent prior study. No biliary duct mass or calculus is evident. Pancreas: There is no  demonstrable pancreatic mass or inflammatory focus. Pancreas is rather atrophic, stable. Spleen: No splenic lesions are evident. Adrenals/Urinary Tract: Adrenals appear unremarkable bilaterally. Areas of scarring in the left kidney are stable. Multiple foci of arterial vascular calcification is noted in each kidney. There is a cyst in the posterior lower pole right kidney measuring 7 x 7 mm. There is a 1 x 1 cm cyst in the lower pole left kidney. There is no hydronephrosis on either side. No renal or ureteral calculi are identified. Urinary bladder is midline with wall thickness within normal limits. Stomach/Bowel: There is dilatation of the stomach and proximal small bowel. There is a transition zone at the junction of the mid and distal portions of the jejunum consistent with a degree of small bowel obstruction. No free air or portal venous air. There is no appreciable bowel wall or mesenteric thickening. There are multiple sigmoid diverticula without diverticulitis. Vascular/Lymphatic: There is atherosclerotic calcification in the aorta and iliac arteries. Peripheral thrombus is noted in the distal aorta. There is no frank abdominal aortic aneurysm. Major mesenteric vessels appear patent. No adenopathy is apparent in the abdomen or pelvis. Reproductive: Uterus is anteverted. There is no pelvic mass. There is free fluid in the cul-de-sac region. Other: There is no abscess apparent in the abdomen or pelvis. There is no periappendiceal inflammation. Musculoskeletal: There is upper lumbar levoscoliosis. There is extensive arthropathy in the lumbar spine. There is vacuum phenomenon at L4-5 and L5-S1. There is evidence of a prior comminuted right proximal femur fracture with screw and plate fixation in this area. No acute fracture is evident. Bones are osteoporotic. There are no evident blastic or lytic bone lesions. There is no intramuscular or abdominal wall lesion. IMPRESSION: Small bowel obstruction with transition  zone at the level of the mid to distal jejunum. No free air. Multiple sigmoid diverticula without diverticulitis. Mild ascites in the dependent portion of the pelvis.  No abscess. Small gallstones within the gallbladder. Stable biliary duct dilatation without biliary duct mass or calculus seen. Atrophic pancreas without focal pancreatic lesion. Extensive atherosclerotic calcification. Prior right proximal femur fracture with postoperative change. Bones osteoporotic. Extensive  arthropathy in the lumbar region, most severe at L4-5 and L5-S1. These results were called by telephone at the time of interpretation on 09/06/2015 at 7:33 pm to [facility] , who verbally acknowledged these results. Electronically Signed   By: Lowella Grip III M.D.   On: 09/06/2015 19:34    EKG: Independently reviewed. Sinus tachycardia rate of 104  Assessment/Plan SBO (small bowel obstruction): Acute. Patient with nausea, vomiting, and abdominal pain for the last 2 days. CT reveals small bowel obstruction transition point at the jejunum.  Dr. Excell Seltzer, Surgery consulted in ED. Suspect secondary to opioids - Admit to a telemetry [facility] - NPO except for ice chips - Continue NGT - Zofran/Compazine prn nausea and vomiting - Fentanyl prn pain - IV Protonix - Will need to readdress medication list in a.m., and restart regular oral medications when able - Gen. surgery consult and follow-up recommendations.   Abdominal pain, nausea, and vomiting: Acute. secondary to above. -  as seen above   Leukocytosis: Acute. Patient with a CBC of 12.6 on admission. Suspected secondary to acute stress. - Monitor for infectious s/s  - Repeat CBC in a.m.  Status post right hip replacement on 5/10 - Physical therapy To pathology in a.m.  COPD/question of lung cancer - Stable at this time   - Continue prn DuoNeb q6h, scheduled Incruse, Dulera  - Monitoring on continuous pulse oximetry  - Patient was supposed to follow-up with  pulmonary Simonne Maffucci, MD In 1 week for the question of bronchiogenic lung cancer  Pericardial effusion: Incidental finding seen on CT of abdomen on 5/15 and a can on 5/30. Patient denies any acute shortness of breath or chest pain symptoms. - Continue to monitor  Hypokalemia: Potassium acutely noted to be 3.3 on admission. Patient still with nausea and vomiting symptoms. - NS IV fluids with 40 mEq of potassium chloride at 75 ml/hr x 12 hours  - Recheck BMP in a.m.   CAD: No complaints of angina or anginal equivalent  - EKG with sinus tachycardia, no acute ischemic findings  - Monitoring on telemetry   Hypothyroidism  - Changed levothyroxine 88 mcg po daily  converted to IV 44 mcg daily  Gerd - IV protonix   DVT prophylaxis: Lovenox and SCDs  Code Status: DNR  Family Communication:  Discussed plan with family bedside   Disposition Plan: Undetermined based off resolution of bowel obstruction  Consults called:General surgery  Admission status: Observation telemetry   Norval Morton MD [facility] Pager [phone]  If 7PM-7AM, please contact night-coverage [email or web address] Password [email or web address]  09/06/2015, 8:11 PM

## 2015-09-07 ENCOUNTER — Observation Stay (HOSPITAL_COMMUNITY): Payer: Medicare Other

## 2015-09-07 ENCOUNTER — Encounter (HOSPITAL_COMMUNITY): Payer: Self-pay | Admitting: General Surgery

## 2015-09-07 DIAGNOSIS — R112 Nausea with vomiting, unspecified: Secondary | ICD-10-CM | POA: Diagnosis present

## 2015-09-07 DIAGNOSIS — Z8249 Family history of ischemic heart disease and other diseases of the circulatory system: Secondary | ICD-10-CM | POA: Diagnosis not present

## 2015-09-07 DIAGNOSIS — Z880 Allergy status to penicillin: Secondary | ICD-10-CM | POA: Diagnosis not present

## 2015-09-07 DIAGNOSIS — F411 Generalized anxiety disorder: Secondary | ICD-10-CM | POA: Diagnosis present

## 2015-09-07 DIAGNOSIS — Z96649 Presence of unspecified artificial hip joint: Secondary | ICD-10-CM

## 2015-09-07 DIAGNOSIS — I10 Essential (primary) hypertension: Secondary | ICD-10-CM | POA: Diagnosis present

## 2015-09-07 DIAGNOSIS — Z9981 Dependence on supplemental oxygen: Secondary | ICD-10-CM | POA: Diagnosis not present

## 2015-09-07 DIAGNOSIS — Z79899 Other long term (current) drug therapy: Secondary | ICD-10-CM | POA: Diagnosis not present

## 2015-09-07 DIAGNOSIS — J449 Chronic obstructive pulmonary disease, unspecified: Secondary | ICD-10-CM | POA: Diagnosis present

## 2015-09-07 DIAGNOSIS — I2510 Atherosclerotic heart disease of native coronary artery without angina pectoris: Secondary | ICD-10-CM | POA: Diagnosis present

## 2015-09-07 DIAGNOSIS — Z888 Allergy status to other drugs, medicaments and biological substances status: Secondary | ICD-10-CM | POA: Diagnosis not present

## 2015-09-07 DIAGNOSIS — I313 Pericardial effusion (noninflammatory): Secondary | ICD-10-CM | POA: Diagnosis present

## 2015-09-07 DIAGNOSIS — Z809 Family history of malignant neoplasm, unspecified: Secondary | ICD-10-CM | POA: Diagnosis not present

## 2015-09-07 DIAGNOSIS — I739 Peripheral vascular disease, unspecified: Secondary | ICD-10-CM | POA: Diagnosis present

## 2015-09-07 DIAGNOSIS — J438 Other emphysema: Secondary | ICD-10-CM | POA: Diagnosis not present

## 2015-09-07 DIAGNOSIS — K565 Intestinal adhesions [bands] with obstruction (postprocedural) (postinfection): Secondary | ICD-10-CM | POA: Diagnosis present

## 2015-09-07 DIAGNOSIS — E876 Hypokalemia: Secondary | ICD-10-CM

## 2015-09-07 DIAGNOSIS — R000 Tachycardia, unspecified: Secondary | ICD-10-CM | POA: Diagnosis present

## 2015-09-07 DIAGNOSIS — K5669 Other intestinal obstruction: Secondary | ICD-10-CM | POA: Diagnosis not present

## 2015-09-07 DIAGNOSIS — K219 Gastro-esophageal reflux disease without esophagitis: Secondary | ICD-10-CM | POA: Diagnosis present

## 2015-09-07 DIAGNOSIS — D72829 Elevated white blood cell count, unspecified: Secondary | ICD-10-CM | POA: Diagnosis present

## 2015-09-07 DIAGNOSIS — Z885 Allergy status to narcotic agent status: Secondary | ICD-10-CM | POA: Diagnosis not present

## 2015-09-07 DIAGNOSIS — Z886 Allergy status to analgesic agent status: Secondary | ICD-10-CM | POA: Diagnosis not present

## 2015-09-07 DIAGNOSIS — E039 Hypothyroidism, unspecified: Secondary | ICD-10-CM | POA: Diagnosis present

## 2015-09-07 DIAGNOSIS — Z881 Allergy status to other antibiotic agents status: Secondary | ICD-10-CM | POA: Diagnosis not present

## 2015-09-07 DIAGNOSIS — Z841 Family history of disorders of kidney and ureter: Secondary | ICD-10-CM | POA: Diagnosis not present

## 2015-09-07 DIAGNOSIS — Z96641 Presence of right artificial hip joint: Secondary | ICD-10-CM | POA: Diagnosis present

## 2015-09-07 DIAGNOSIS — Z87891 Personal history of nicotine dependence: Secondary | ICD-10-CM | POA: Diagnosis not present

## 2015-09-07 DIAGNOSIS — Z66 Do not resuscitate: Secondary | ICD-10-CM | POA: Diagnosis present

## 2015-09-07 DIAGNOSIS — I252 Old myocardial infarction: Secondary | ICD-10-CM | POA: Diagnosis not present

## 2015-09-07 DIAGNOSIS — Z882 Allergy status to sulfonamides status: Secondary | ICD-10-CM | POA: Diagnosis not present

## 2015-09-07 LAB — CBC
HCT: 31.7 % — ABNORMAL LOW (ref 36.0–46.0)
HEMOGLOBIN: 10 g/dL — AB (ref 12.0–15.0)
MCH: 30.2 pg (ref 26.0–34.0)
MCHC: 31.5 g/dL (ref 30.0–36.0)
MCV: 95.8 fL (ref 78.0–100.0)
Platelets: 391 10*3/uL (ref 150–400)
RBC: 3.31 MIL/uL — AB (ref 3.87–5.11)
RDW: 14.5 % (ref 11.5–15.5)
WBC: 7.7 10*3/uL (ref 4.0–10.5)

## 2015-09-07 LAB — BASIC METABOLIC PANEL
ANION GAP: 8 (ref 5–15)
BUN: 17 mg/dL (ref 6–20)
CALCIUM: 8.4 mg/dL — AB (ref 8.9–10.3)
CHLORIDE: 111 mmol/L (ref 101–111)
CO2: 25 mmol/L (ref 22–32)
Creatinine, Ser: 0.73 mg/dL (ref 0.44–1.00)
GFR calc non Af Amer: >60 mL/min (ref >60)
Glucose, Bld: 106 mg/dL — ABNORMAL HIGH (ref 65–99)
Potassium: 4 mmol/L (ref 3.5–5.1)
SODIUM: 144 mmol/L (ref 135–145)

## 2015-09-07 MED ORDER — SODIUM CHLORIDE 0.9 % IV SOLN
INTRAVENOUS | Status: DC
  Administered 2015-09-07 – 2015-09-08 (×3): via INTRAVENOUS

## 2015-09-07 MED ORDER — FENTANYL CITRATE (PF) 100 MCG/2ML IJ SOLN
25.0000 ug | INTRAMUSCULAR | Status: DC | PRN
  Administered 2015-09-08 – 2015-09-09 (×5): 25 ug via INTRAVENOUS
  Filled 2015-09-07 (×5): qty 2

## 2015-09-07 MED ORDER — ENOXAPARIN SODIUM 40 MG/0.4ML ~~LOC~~ SOLN
40.0000 mg | SUBCUTANEOUS | Status: DC
  Administered 2015-09-07 – 2015-09-08 (×2): 40 mg via SUBCUTANEOUS
  Filled 2015-09-07 (×2): qty 0.4

## 2015-09-07 MED ORDER — DIATRIZOATE MEGLUMINE & SODIUM 66-10 % PO SOLN
90.0000 mL | Freq: Once | ORAL | Status: AC
  Administered 2015-09-07: 90 mL via NASOGASTRIC
  Filled 2015-09-07: qty 90

## 2015-09-07 NOTE — Care Management Obs Status (Signed)
[place] NOTIFICATION   Patient Details  Name: ARNELLE NALE MRN: [id number] Date of Birth: Sep 23, 1930   Medicare Observation Status Notification Given:  Yes    MahabirJuliann Pulse, RN 09/07/2015, 1:16 PM

## 2015-09-07 NOTE — Progress Notes (Signed)
Initial Nutrition Assessment  DOCUMENTATION CODES:   Severe malnutrition in context of acute illness/injury  INTERVENTION:  -Ensure Enlive po BID, each supplement provides 350 kcal and 20 grams of protein with advancement -RD to continue to monitor  NUTRITION DIAGNOSIS:   Malnutrition related to acute illness as evidenced by moderate depletions of muscle mass, moderate depletion of body fat, percent weight loss.  GOAL:   Patient will meet greater than or equal to 90% of their needs  MONITOR:   Diet advancement, I & O's, Supplement acceptance, Labs, Skin  REASON FOR ASSESSMENT:   Other (Comment) (Low BMI)    ASSESSMENT:   [name] is a 82 y.o. female with medical history significant of COPD, suspected lung CA, oxygen dependent on 2 L, anxiety, chronic  anemia, CAD, PVD, HTN, and carotid artery occlusion; who presents with complaints of a 2 day history of nausea, vomiting, and abdominal pain. recently been admitted (5/9 -5/15)after sustaining a right hip fracture after a fall.  Ms. Gertz is a pleasant lady who I saw a week ago for Hip Fracture protocol. She was d/c to rehab, but unfortunately, has been admitted for SBO. She had n/v for 2 days PTA, no vomiting since last nite. She is still experiencing some nausea. Per surgery note, they were unable to get an NGT placed x2. They also noted she has active bowel sounds, has passed some gas, and abdomen is not distended. She will be managed conservatively.  She states that prior to the onset of n/v she was eating fine, similar to my previous assessment.   During these 3 weeks she does exhibit slightly more muscle wasting and fat depletion, in addition to a 7#/6.4% severe wt loss in 3 weeks.  Nutrition-Focused physical exam completed. Findings are moderate fat depletion, moderate muscle depletion, and no edema.   Labs and Medications reviewed  Diet Order:  Diet NPO time specified  Skin:  Reviewed, no issues (Closed  Incision to R Hip)  Last BM:  5/30  Height:   Ht Readings from Last 1 Encounters:  09/06/15 '5\' 4"'$  (1.626 m)    Weight:   Wt Readings from Last 1 Encounters:  09/06/15 102 lb 1.2 oz (46.3 kg)    Ideal Body Weight:  54.54 kg  BMI:  Body mass index is 17.51 kg/(m^2).  Estimated Nutritional Needs:   Kcal:  1250-1500  Protein:  50-65 grams  Fluid:  >/= 1.25L  EDUCATION NEEDS:   No education needs identified at this time  Satira Anis. Makalya Nave, MS, RD LDN Inpatient Clinical Dietitian Pager [phone]

## 2015-09-07 NOTE — Progress Notes (Signed)
Subjective: She did not get the gastrografin last PM because the NG was never placed.  After 2 tries she would not allow them to continue.  She has not vomited since last PM.  She has a midline scar Detert the umbilicus from 3 prior C sections.  No flatus or BM so far.    Objective: Vital signs in last 24 hours: Temp:  [97.6 F (36.4 C)-98.6 F (37 C)] 98.6 F (37 C) (05/31 0616) Pulse Rate:  [86-106] 86 (05/31 0616) Resp:  [16-22] 20 (05/31 0616) BP: (94-168)/(55-78) 146/61 mmHg (05/31 0616) SpO2:  [94 %-99 %] 99 % (05/31 0815) Weight:  [46.3 kg (102 lb 1.2 oz)] 46.3 kg (102 lb 1.2 oz) (05/30 2125) Last BM Date: 08/27/15 (per pt. ) 250 urine Afebrile, VSS Labs OK Film O730:  Improve SB pattern  She did not get the Small bowel protocol because NG was not in place. Intake/Output from previous day: 05/30 0701 - 05/31 0700 In: 1562.5 [I.V.:1562.5] Out: 250 [Urine:250] Intake/Output this shift:    General appearance: alert, cooperative and no distress GI: soft, still mildly distended, BS are hyperactive.    Lab Results:   Recent Labs  09/06/15 1746 09/07/15 0458  WBC 12.8* 7.7  HGB 12.1 10.0*  HCT 37.3 31.7*  PLT 484* 391    BMET  Recent Labs  09/06/15 1746 09/07/15 0458  NA 139 144  K 3.3* 4.0  CL 99* 111  CO2 29 25  GLUCOSE 140* 106*  BUN 22* 17  CREATININE 0.69 0.73  CALCIUM 9.2 8.4*   PT/INR No results for input(s): LABPROT, INR in the last 72 hours.   Recent Labs Lab 09/06/15 1746  AST 26  ALT 20  ALKPHOS 105  BILITOT 1.0  PROT 6.9  ALBUMIN 3.9     Lipase     Component Value Date/Time   LIPASE 20 09/06/2015 1746     Studies/Results: Ct Abdomen Pelvis W Contrast  09/06/2015  CLINICAL DATA:  Generalized abdominal pain with nausea and vomiting EXAM: CT ABDOMEN AND PELVIS WITH CONTRAST TECHNIQUE: Multidetector CT imaging of the abdomen and pelvis was performed using the standard protocol following bolus administration of intravenous  contrast. CONTRAST:  114m ISOVUE-300 IOPAMIDOL (ISOVUE-300) INJECTION 61% COMPARISON:  Aug 22, 2015 FINDINGS: Lower chest: There is mild bibasilar atelectatic change. There is the pericardial effusion. There is atherosclerotic calcification in the distal aorta. Hepatobiliary: No focal liver lesions are identified. There is focal fatty infiltration near the fissure for the ligamentum teres. Small gallstones are noted within the gallbladder. The gallbladder wall is not thickened. Mild intrahepatic and extrahepatic biliary duct dilatation is stable compared to recent prior study. No biliary duct mass or calculus is evident. Pancreas: There is no demonstrable pancreatic mass or inflammatory focus. Pancreas is rather atrophic, stable. Spleen: No splenic lesions are evident. Adrenals/Urinary Tract: Adrenals appear unremarkable bilaterally. Areas of scarring in the left kidney are stable. Multiple foci of arterial vascular calcification is noted in each kidney. There is a cyst in the posterior lower pole right kidney measuring 7 x 7 mm. There is a 1 x 1 cm cyst in the lower pole left kidney. There is no hydronephrosis on either side. No renal or ureteral calculi are identified. Urinary bladder is midline with wall thickness within normal limits. Stomach/Bowel: There is dilatation of the stomach and proximal small bowel. There is a transition zone at the junction of the mid and distal portions of the jejunum consistent with a degree of  small bowel obstruction. No free air or portal venous air. There is no appreciable bowel wall or mesenteric thickening. There are multiple sigmoid diverticula without diverticulitis. Vascular/Lymphatic: There is atherosclerotic calcification in the aorta and iliac arteries. Peripheral thrombus is noted in the distal aorta. There is no frank abdominal aortic aneurysm. Major mesenteric vessels appear patent. No adenopathy is apparent in the abdomen or pelvis. Reproductive: Uterus is  anteverted. There is no pelvic mass. There is free fluid in the cul-de-sac region. Other: There is no abscess apparent in the abdomen or pelvis. There is no periappendiceal inflammation. Musculoskeletal: There is upper lumbar levoscoliosis. There is extensive arthropathy in the lumbar spine. There is vacuum phenomenon at L4-5 and L5-S1. There is evidence of a prior comminuted right proximal femur fracture with screw and plate fixation in this area. No acute fracture is evident. Bones are osteoporotic. There are no evident blastic or lytic bone lesions. There is no intramuscular or abdominal wall lesion. IMPRESSION: Small bowel obstruction with transition zone at the level of the mid to distal jejunum. No free air. Multiple sigmoid diverticula without diverticulitis. Mild ascites in the dependent portion of the pelvis.  No abscess. Small gallstones within the gallbladder. Stable biliary duct dilatation without biliary duct mass or calculus seen. Atrophic pancreas without focal pancreatic lesion. Extensive atherosclerotic calcification. Prior right proximal femur fracture with postoperative change. Bones osteoporotic. Extensive arthropathy in the lumbar region, most severe at L4-5 and L5-S1. These results were called by telephone at the time of interpretation on 09/06/2015 at 7:33 pm to [facility], [facility] , who verbally acknowledged these results. Electronically Signed   By: Lowella Grip III M.D.   On: 09/06/2015 19:34   Dg Chest Port 1 View  09/06/2015  CLINICAL DATA:  Abdominal pain, weakness, possible surgery for small bowel obstruction EXAM: PORTABLE CHEST 1 VIEW COMPARISON:  CT chest dated 08/21/2015 FINDINGS: Lungs are essentially clear. Recent ground-glass nodular opacities on CT are not radiographically evident. No pleural effusion or pneumothorax. The heart is normal in size. IMPRESSION: No evidence of acute cardiopulmonary disease. Recent ground-glass nodular opacities on CT are not radiographically  evident. Electronically Signed   By: Julian Hy M.D.   On: 09/06/2015 21:54   Dg Abd Portable 1v  09/07/2015  CLINICAL DATA:  Small bowel obstruction EXAM: PORTABLE ABDOMEN - 1 VIEW COMPARISON:  Yesterday FINDINGS: Dilated small bowel loops and nondilated gas-filled large bowel are scattered across the abdomen. Gas-filled small bowel loops have become less prominent. Iodinated contrast fills the bladder. No obvious free intraperitoneal gas. Osteopenia. Degenerative changes in the hip joints and lumbar spine with levoscoliosis. IMPRESSION: Improved partial small bowel obstruction pattern. Electronically Signed   By: Marybelle Killings M.D.   On: 09/07/2015 07:27    Medications: . antiseptic oral rinse  7 mL Mouth Rinse BID  . diatrizoate meglumine-sodium  90 mL Per NG tube Once  . levothyroxine  44 mcg Intravenous Daily  . mometasone-formoterol  2 puff Inhalation BID  . pantoprazole (PROTONIX) IV  40 mg Intravenous Q12H  . umeclidinium bromide  1 puff Inhalation Daily   . 0.9 % NaCl with KCl 40 mEq / L 75 mL/hr (09/06/15 2330)    Assessment/Plan SBO  3 prior C Sections Recent Right hip fracture Pulmonary masses COPD Hypertension Hx of MI/CAD Prior hx of C diff FEN:  IV fluids ID:  None DVT:  SCD only currently  Plan:  I will see if we cannot get her to drink the gastrografin  and see how she does.  I would keep her NPO except for some ice chips.  If she has recurrent Nausea or vomits go back to strict NPO.            Earnstine Regal 09/07/2015 [id number]

## 2015-09-07 NOTE — Care Management Note (Signed)
Case Management Note  Patient Details  Name: ARICELA BERTAGNOLLI MRN: [id number] Date of Birth: Apr 15, 1930  Subjective/Objective:   82 y/o f admitted w/SBO. From [facility].MD notified if on IVF can be IP status.Patient may consider going home.PT cons placed-await recc.CSW following.                 Action/Plan:d/c plan SNF.   Expected Discharge Date:   (unknown)               Expected Discharge Plan:  Skilled Nursing Facility  In-House Referral:  Clinical Social Work  Discharge planning Services  CM Consult  Post Acute Care Choice:    Choice offered to:     DME Arranged:    DME Agency:     HH Arranged:    [place] Agency:     Status of Service:  In process, will continue to follow  Medicare Important Message Given:    Date Medicare IM Given:    Medicare IM give by:    Date Additional Medicare IM Given:    Additional Medicare Important Message give by:     If discussed at [place] of Stay Meetings, dates discussed:    Additional Comments:  Dessa Phi, RN 09/07/2015, 12:27 PM

## 2015-09-07 NOTE — Progress Notes (Signed)
RN attempted to insert NG tube twice with assistance of another RN. Pt. Was not able to tolerate NG tube insertion and refused any future attempts to insert NG tube. Pt. Educated and MD made aware. Will continue to monitor and carry out any new orders.

## 2015-09-07 NOTE — Progress Notes (Signed)
CCMD notified RN that pt. Had a HR of 150 at 2251 and immediately went down to 103 afterwards. Pt. Was awake and stable. No c/o symptoms. Will continue to monitor.

## 2015-09-07 NOTE — Progress Notes (Signed)
[facility] Progress Note   [name]  FUX:[id number]  DOB: [birth year]  DOA: 09/06/2015 PCP: Junie Panning, NP  Brief narrative: [name] is a 82 y.o. female COPD, suspected lung CA, oxygen dependent on 2 L, anxiety, chronic [place] anemia, CAD, PVD, HTN, and carotid artery occlusion; who presents with complaints of a 2 day history of nausea, vomiting, and abdominal pain. She was admitted for treatment of SBO.    Subjective: Abdomen feels sore. Had a large BM earlier and was able to drink the gastrografin.   Assessment/Plan: Principal Problem:   SBO (small bowel obstruction) - nausea/ vomiting/ abdominal pain - continue conservative management- surgery assisting- NG tube was not able to be placed but she is no longer vomiting, had a BM and was able to drink gastrograffin- xray tomorrow AM  Active Problems:   Hypothyroidism - synthroid    COPD (chronic obstructive pulmonary disease) - stable- has O2 which she uses at home "as needed"    Hypokalemia - replaced    S/P hip replacement    Antibiotics: Anti-infectives    None     Code Status:     Code Status Orders        Start     Ordered   09/06/15 2136  Do not attempt resuscitation (DNR)   Continuous    Question Answer Comment  In the event of cardiac or respiratory ARREST Do not call a "code blue"   In the event of cardiac or respiratory ARREST Do not perform Intubation, CPR, defibrillation or ACLS   In the event of cardiac or respiratory ARREST Use medication by any route, position, wound care, and other measures to relive pain and suffering. May use oxygen, suction and manual treatment of airway obstruction as needed for comfort.      09/06/15 2142    Code Status History    Date Active Date Inactive Code Status Order ID Comments User Context   08/17/2015  3:17 AM 08/22/2015  6:30 PM DNR [id number]  Norval Morton, MD Inpatient   05/29/2015  9:25 PM 05/31/2015  5:02 PM DNR [id number]  Vianne Bulls, MD ED   11/06/2013  8:28 PM 05/29/2015  9:25 PM DNR [id number]  Hennie Duos, MD Outpatient   11/01/2013  8:33 PM 11/04/2013  6:27 PM DNR [id number]  Berle Mull, MD ED   09/16/2013  2:58 PM 09/21/2013  5:24 PM DNR [id number]  Kinnie Feil, MD Inpatient   09/14/2013 10:33 PM 09/16/2013  2:58 PM Full Code [id number]  Etta Quill, DO ED    Advance Directive Documentation        Most Recent Value   Type of Advance Directive  Out of facility DNR (pink MOST or yellow form)   Pre-existing out of facility DNR order (yellow form or pink MOST form)     "MOST" Form in Place?       Family Communication:   Disposition Plan: home in 1-2 days DVT prophylaxis: Lovenox Consultants: gen surgery  Procedures:     Objective: Filed Weights   09/06/15 2125  Weight: 46.3 kg (102 lb 1.2 oz)    Intake/Output Summary (Last 24 hours) at 09/07/15 1351 Last data filed at 09/07/15 0925  Gross per 24 hour  Intake 1562.5 ml  Output    370 ml  Net 1192.5 ml     Vitals Filed Vitals:   09/06/15 2321 09/07/15 0616 09/07/15 0815 09/07/15 1302  BP:  146/61  145/57  Pulse:  86  79  Temp:  98.6 F (37 C)  97.6 F (36.4 C)  TempSrc:  Oral  Oral  Resp:  20  20  Height:      Weight:      SpO2: 97% 99% 99% 99%    Exam:  General:  Pt is alert, not in acute distress  HEENT: No icterus, No thrush, oral mucosa moist  Cardiovascular: regular rate and rhythm, S1/S2 No murmur  Respiratory: clear to auscultation bilaterally   Abdomen: Soft, +Bowel sounds, non tender, non distended, no guarding  MSK: No cyanosis or clubbing- no pedal edema   Data Reviewed: Basic Metabolic Panel:  Recent Labs Lab 09/06/15 1746 09/07/15 0458  NA 139 144  K 3.3* 4.0  CL 99* 111  CO2 29 25  GLUCOSE 140* 106*  BUN 22* 17  CREATININE 0.69 0.73  CALCIUM 9.2 8.4*   Liver Function Tests:  Recent Labs Lab 09/06/15 1746  AST 26  ALT 20  ALKPHOS 105  BILITOT 1.0  PROT 6.9  ALBUMIN 3.9    Recent  Labs Lab 09/06/15 1746  LIPASE 20   No results for input(s): AMMONIA in the last 168 hours. CBC:  Recent Labs Lab 09/06/15 1746 09/07/15 0458  WBC 12.8* 7.7  HGB 12.1 10.0*  HCT 37.3 31.7*  MCV 94.2 95.8  PLT 484* 391   Cardiac Enzymes: No results for input(s): CKTOTAL, CKMB, CKMBINDEX, TROPONINI in the last 168 hours. BNP (last 3 results)  Recent Labs  05/31/15 0553  BNP 255.9*    ProBNP (last 3 results) No results for input(s): PROBNP in the last 8760 hours.  CBG: No results for input(s): GLUCAP in the last 168 hours.  No results found for this or any previous visit (from the past 240 hour(s)).   Studies: Ct Abdomen Pelvis W Contrast  09/06/2015  CLINICAL DATA:  Generalized abdominal pain with nausea and vomiting EXAM: CT ABDOMEN AND PELVIS WITH CONTRAST TECHNIQUE: Multidetector CT imaging of the abdomen and pelvis was performed using the standard protocol following bolus administration of intravenous contrast. CONTRAST:  129m ISOVUE-300 IOPAMIDOL (ISOVUE-300) INJECTION 61% COMPARISON:  Aug 22, 2015 FINDINGS: Lower chest: There is mild bibasilar atelectatic change. There is the pericardial effusion. There is atherosclerotic calcification in the distal aorta. Hepatobiliary: No focal liver lesions are identified. There is focal fatty infiltration near the fissure for the ligamentum teres. Small gallstones are noted within the gallbladder. The gallbladder wall is not thickened. Mild intrahepatic and extrahepatic biliary duct dilatation is stable compared to recent prior study. No biliary duct mass or calculus is evident. Pancreas: There is no demonstrable pancreatic mass or inflammatory focus. Pancreas is rather atrophic, stable. Spleen: No splenic lesions are evident. Adrenals/Urinary Tract: Adrenals appear unremarkable bilaterally. Areas of scarring in the left kidney are stable. Multiple foci of arterial vascular calcification is noted in each kidney. There is a cyst in the  posterior lower pole right kidney measuring 7 x 7 mm. There is a 1 x 1 cm cyst in the lower pole left kidney. There is no hydronephrosis on either side. No renal or ureteral calculi are identified. Urinary bladder is midline with wall thickness within normal limits. Stomach/Bowel: There is dilatation of the stomach and proximal small bowel. There is a transition zone at the junction of the mid and distal portions of the jejunum consistent with a degree of small bowel obstruction. No free air or portal venous air. There is no appreciable bowel wall or mesenteric  thickening. There are multiple sigmoid diverticula without diverticulitis. Vascular/Lymphatic: There is atherosclerotic calcification in the aorta and iliac arteries. Peripheral thrombus is noted in the distal aorta. There is no frank abdominal aortic aneurysm. Major mesenteric vessels appear patent. No adenopathy is apparent in the abdomen or pelvis. Reproductive: Uterus is anteverted. There is no pelvic mass. There is free fluid in the cul-de-sac region. Other: There is no abscess apparent in the abdomen or pelvis. There is no periappendiceal inflammation. Musculoskeletal: There is upper lumbar levoscoliosis. There is extensive arthropathy in the lumbar spine. There is vacuum phenomenon at L4-5 and L5-S1. There is evidence of a prior comminuted right proximal femur fracture with screw and plate fixation in this area. No acute fracture is evident. Bones are osteoporotic. There are no evident blastic or lytic bone lesions. There is no intramuscular or abdominal wall lesion. IMPRESSION: Small bowel obstruction with transition zone at the level of the mid to distal jejunum. No free air. Multiple sigmoid diverticula without diverticulitis. Mild ascites in the dependent portion of the pelvis.  No abscess. Small gallstones within the gallbladder. Stable biliary duct dilatation without biliary duct mass or calculus seen. Atrophic pancreas without focal pancreatic  lesion. Extensive atherosclerotic calcification. Prior right proximal femur fracture with postoperative change. Bones osteoporotic. Extensive arthropathy in the lumbar region, most severe at L4-5 and L5-S1. These results were called by telephone at the time of interpretation on 09/06/2015 at 7:33 pm to [facility], [facility] , who verbally acknowledged these results. Electronically Signed   By: Lowella Grip III M.D.   On: 09/06/2015 19:34   Dg Chest Port 1 View  09/06/2015  CLINICAL DATA:  Abdominal pain, weakness, possible surgery for small bowel obstruction EXAM: PORTABLE CHEST 1 VIEW COMPARISON:  CT chest dated 08/21/2015 FINDINGS: Lungs are essentially clear. Recent ground-glass nodular opacities on CT are not radiographically evident. No pleural effusion or pneumothorax. The heart is normal in size. IMPRESSION: No evidence of acute cardiopulmonary disease. Recent ground-glass nodular opacities on CT are not radiographically evident. Electronically Signed   By: Julian Hy M.D.   On: 09/06/2015 21:54   Dg Abd Portable 1v  09/07/2015  CLINICAL DATA:  Small bowel obstruction EXAM: PORTABLE ABDOMEN - 1 VIEW COMPARISON:  Yesterday FINDINGS: Dilated small bowel loops and nondilated gas-filled large bowel are scattered across the abdomen. Gas-filled small bowel loops have become less prominent. Iodinated contrast fills the bladder. No obvious free intraperitoneal gas. Osteopenia. Degenerative changes in the hip joints and lumbar spine with levoscoliosis. IMPRESSION: Improved partial small bowel obstruction pattern. Electronically Signed   By: Marybelle Killings M.D.   On: 09/07/2015 07:27    Scheduled Meds:  Scheduled Meds: . antiseptic oral rinse  7 mL Mouth Rinse BID  . diatrizoate meglumine-sodium  90 mL Per NG tube Once  . levothyroxine  44 mcg Intravenous Daily  . mometasone-formoterol  2 puff Inhalation BID  . pantoprazole (PROTONIX) IV  40 mg Intravenous Q12H  . umeclidinium bromide  1 puff  Inhalation Daily   Continuous Infusions:   Time spent on care of this patient: 19 min   [place], MD 09/07/2015, 1:51 PM    [facility] Office  [phone] Pager - Text Page per [email or web address] If 7PM-7AM, please contact night-coverage [email or web address]

## 2015-09-08 ENCOUNTER — Inpatient Hospital Stay (HOSPITAL_COMMUNITY): Payer: Medicare Other

## 2015-09-08 LAB — CBC
HCT: 31.5 % — ABNORMAL LOW (ref 36.0–46.0)
Hemoglobin: 9.8 g/dL — ABNORMAL LOW (ref 12.0–15.0)
MCH: 30.6 pg (ref 26.0–34.0)
MCHC: 31.1 g/dL (ref 30.0–36.0)
MCV: 98.4 fL (ref 78.0–100.0)
PLATELETS: 333 10*3/uL (ref 150–400)
RBC: 3.2 MIL/uL — ABNORMAL LOW (ref 3.87–5.11)
RDW: 14.4 % (ref 11.5–15.5)
WBC: 8.9 10*3/uL (ref 4.0–10.5)

## 2015-09-08 LAB — BASIC METABOLIC PANEL
Anion gap: 14 (ref 5–15)
BUN: 18 mg/dL (ref 6–20)
CALCIUM: 8.4 mg/dL — AB (ref 8.9–10.3)
CO2: 17 mmol/L — ABNORMAL LOW (ref 22–32)
CREATININE: 0.69 mg/dL (ref 0.44–1.00)
Chloride: 111 mmol/L (ref 101–111)
GFR calc non Af Amer: >60 mL/min (ref >60)
Glucose, Bld: 58 mg/dL — ABNORMAL LOW (ref 65–99)
Potassium: 2.8 mmol/L — ABNORMAL LOW (ref 3.5–5.1)
SODIUM: 142 mmol/L (ref 135–145)

## 2015-09-08 MED ORDER — POTASSIUM CHLORIDE CRYS ER 20 MEQ PO TBCR
20.0000 meq | EXTENDED_RELEASE_TABLET | Freq: Once | ORAL | Status: AC
  Administered 2015-09-08: 20 meq via ORAL
  Filled 2015-09-08: qty 1

## 2015-09-08 MED ORDER — POTASSIUM CHLORIDE 10 MEQ/100ML IV SOLN
10.0000 meq | INTRAVENOUS | Status: DC
  Administered 2015-09-08 (×4): 10 meq via INTRAVENOUS
  Filled 2015-09-08 (×4): qty 100

## 2015-09-08 MED ORDER — ENSURE ENLIVE PO LIQD
237.0000 mL | Freq: Two times a day (BID) | ORAL | Status: DC
  Administered 2015-09-08 – 2015-09-09 (×2): 237 mL via ORAL

## 2015-09-08 NOTE — Evaluation (Signed)
Physical Therapy Evaluation Patient Details Name: [name] MRN: [id number] DOB: [birth year] Today's Date: 09/08/2015   History of Present Illness  82 y.o. female COPD, suspected lung CA, oxygen dependent on 2 L, anxiety, chronic [place] anemia, CAD, PVD, HTN, and carotid artery occlusion; Pt s/p R IM nail 08/17/15 and admitted 5/30 for treatment of SBO.  Clinical Impression  Pt admitted with above diagnosis. Pt currently with functional limitations due to the deficits listed [place] (see PT Problem List).  Pt will benefit from skilled PT to increase their independence and safety with mobility to allow discharge to the venue listed [place].   Pt admitted from SNF where she was receiving rehab after R IM nail and plans to return to SNF.     Follow Up Recommendations SNF;Supervision/Assistance - 24 hour    Equipment Recommendations  None recommended by PT    Recommendations for Other Services       Precautions / Restrictions Precautions Precautions: Fall Precaution Comments: chronic 2L O2 at baseline Restrictions Other Position/Activity Restrictions: WBAT per previous admission notes      Mobility  Bed Mobility Overal bed mobility: Needs Assistance Bed Mobility: Supine to Sit     Supine to sit: Mod assist     General bed mobility comments: assist for R LE and trunk upright  Transfers Overall transfer level: Needs assistance Equipment used: Rolling walker (2 wheeled) Transfers: Sit to/from Stand Sit to Stand: Min assist         General transfer comment: verbal cues for technique and assist for rise and steady  Ambulation/Gait Ambulation/Gait assistance: Min assist Ambulation Distance (Feet): 4 Feet Assistive device: Rolling walker (2 wheeled) Gait Pattern/deviations: Step-to pattern;Antalgic     General Gait Details: verbal cues for sequence, RW positioning, pt took a few steps forward and fatigued quickly, recliner brought behind pt, pt remained on 4L O2 [place]  throughout session  Stairs            Wheelchair Mobility    Modified Rankin (Stroke Patients Only)       Balance                                             Pertinent Vitals/Pain Pain Assessment: 0-10 Pain Score: 5  Pain Location: R LE Pain Descriptors / Indicators: Sore;Tightness Pain Intervention(s): Limited activity within patient's tolerance;Monitored during session;Repositioned    Home Living Family/patient expects to be discharged to:: Skilled nursing facility     Type of Home: House Home Access: Level entry     Home Layout: One level Home Equipment: Environmental consultant - 2 wheels;Walker - 4 wheels Additional Comments: plans to d/c back to SNF    Prior Function Level of Independence: Independent with assistive device(s)         Comments: prior to R IM nail surgery, pt admitted from SNF for rehab     Hand Dominance        Extremity/Trunk Assessment   Upper Extremity Assessment: Generalized weakness           Lower Extremity Assessment: RLE deficits/detail RLE Deficits / Details: grossly at least 2+/5 observed       Communication   Communication: No difficulties  Cognition Arousal/Alertness: Awake/alert Behavior During Therapy: [facility] for tasks assessed/performed  General Comments      Exercises        Assessment/Plan    PT Assessment Patient needs continued PT services  PT Diagnosis Difficulty walking   PT Problem List Decreased strength;Decreased range of motion;Decreased activity tolerance;Decreased mobility;Decreased balance;Decreased safety awareness;Decreased knowledge of use of DME;Pain  PT Treatment Interventions DME instruction;Gait training;Functional mobility training;Therapeutic activities;Therapeutic exercise;Patient/family education   PT Goals (Current goals can be found in the Care Plan section) Acute Rehab PT Goals PT Goal Formulation: With patient Time For Goal Achievement:  09/22/15 Potential to Achieve Goals: Good    Frequency Min 3X/week   Barriers to discharge        Co-evaluation               End of Session Equipment Utilized During Treatment: Gait belt;Oxygen Activity Tolerance: Patient tolerated treatment well Patient left: with call bell/phone within reach;in chair;with chair alarm set Nurse Communication: Mobility status         Time: 1123-1140 PT Time Calculation (min) (ACUTE ONLY): 17 min   Charges:   PT Evaluation $PT Eval Low Complexity: 1 Procedure     PT G Codes:        Zeeshan Korte,KATHrine E 09/08/2015, 12:49 PM Carmelia Bake, PT, DPT 09/08/2015 Pager: [phone]

## 2015-09-08 NOTE — Progress Notes (Signed)
CSW reviewed PT evaluation recommending SNF. CSW spoke with patient re: discharge plans. Patient was at [facility] 5/15 - 5/30 following hip surgery & is agreeable to return. CSW confirmed with Rosendo Gros at [facility] that they would be able to take patient back when ready.    Raynaldo Opitz, [facility] Clinical Social Worker cell #: [phone]

## 2015-09-08 NOTE — NC FL2 (Signed)
[place] LEVEL OF CARE SCREENING TOOL     IDENTIFICATION  Patient Name: [name] Birthdate: Jan 04, 1931 Sex: female Admission Date (Current Location): 09/06/2015  [facility] and [place] Number:  Herbalist and Address:  [facility],  [address] [place]      Provider Number: [id number]  Attending Physician Name and Address:  Debbe Odea, MD  Relative Name and Phone Number:       Current Level of Care: Hospital Recommended Level of Care: [place] Prior Approval Number:    Date Approved/Denied:   PASRR Number: [id number]  Discharge Plan: SNF    Current Diagnoses: Patient Active Problem List   Diagnosis Date Noted  . Nausea and vomiting 09/07/2015  . Hypokalemia 09/07/2015  . S/P hip replacement 09/07/2015  . SBO (small bowel obstruction) ([place]) 09/06/2015  . Anxiety 08/29/2015  . Lung cancer ([place]) 08/29/2015  . Hypertensive heart disease without CHF 08/29/2015  . Hip fracture ([place]) 08/17/2015  . Hip fracture, right ([place]) 08/17/2015  . Fall 08/17/2015  . Anemia 08/17/2015  . Insomnia 08/17/2015  . Acute blood loss anemia 05/31/2015  . Acute upper gastrointestinal bleeding 05/29/2015  . Leukocytosis 05/29/2015  . History of GI bleed 05/29/2015  . Acute encephalopathy 11/04/2013  . Noninfectious gastroenteritis and colitis 11/04/2013  . Protein-calorie malnutrition, severe ([place]) 11/02/2013  . Delirium 11/01/2013  . Altered mental state 11/01/2013  . COPD (chronic obstructive pulmonary disease) ([place]) 11/01/2013  . Chronic hypoxemic respiratory failure ([place]) 11/01/2013  . Toxic encephalopathy 10/03/2013  . UTI (urinary tract infection) 10/03/2013  . Acute-on-chronic respiratory failure ([place]) 10/03/2013  . Cerebral embolism with cerebral infarction ([place]) 09/18/2013  . Accidental overdose 09/14/2013  . Hypothermia 09/14/2013  . Spinal stenosis of lumbar region at multiple levels 02/15/2011   . PULMONARY NODULE 02/22/2010  . Nonspecific (abnormal) findings on radiological and other examination of body structure 11/29/2008  . CT, CHEST, ABNORMAL 11/29/2008  . Hypothyroidism 06/25/2007  . HYPERCHOLESTEROLEMIA 06/25/2007  . ANEMIA 06/25/2007  . AMI 06/25/2007  . ANGINA, MILD 06/25/2007  . CAD (coronary artery disease) 06/25/2007  . HEMORRHOIDS, INTERNAL 06/25/2007  . HEMORRHOIDS, EXTERNAL 06/25/2007  . HEMOCCULT POSITIVE STOOL 06/25/2007  . PYELONEPHRITIS, ACUTE 06/25/2007  . COLONIC POLYPS, HYPERPLASTIC 10/15/1997  . DIVERTICULOSIS, COLON 10/15/1997    Orientation RESPIRATION BLADDER Height & Weight     Self, Time, Situation, Place  O2 (4L ) Continent Weight: 102 lb 1.2 oz (46.3 kg) Height:  '5\' 4"'$  (162.6 cm)  BEHAVIORAL SYMPTOMS/MOOD NEUROLOGICAL BOWEL NUTRITION STATUS      Incontinent Diet (Full Liquid)  AMBULATORY STATUS COMMUNICATION OF NEEDS Skin   Extensive Assist Verbally Surgical wounds (Incision(Closed)05/10/17HipRight)                       Personal Care Assistance Level of Assistance  Bathing, Feeding, Dressing Bathing Assistance: Limited assistance Feeding assistance: Limited assistance Dressing Assistance: Limited assistance     Functional Limitations Info  Sight, Hearing, Speech Sight Info: Adequate Hearing Info: Adequate Speech Info: Adequate    SPECIAL CARE FACTORS FREQUENCY  PT (By licensed PT), OT (By licensed OT)     PT Frequency: 5 OT Frequency: 5            Contractures      Additional Factors Info  Code Status, Allergies Code Status Info: DNR Allergies Info: Aspirin, Penicillins, Biaxin, Sulfonamide Derivatives, Buprenorphine, Codeine, Hydrocodone, Morphine And Related, Sulfa Antibiotics, Xanax  Current Medications (09/08/2015):  This is the current hospital active medication list Current Facility-Administered Medications  Medication Dose Route Frequency Provider Last Rate Last Dose  . 0.9 %  sodium  chloride infusion   Intravenous Continuous Jackolyn Confer, MD 75 mL/hr at 09/08/15 1045    . acetaminophen (TYLENOL) tablet 650 mg  650 mg Oral Q6H PRN Norval Morton, MD   650 mg at 09/08/15 1158   Or  . acetaminophen (TYLENOL) suppository 650 mg  650 mg Rectal Q6H PRN Norval Morton, MD      . antiseptic oral rinse (CPC / CETYLPYRIDINIUM CHLORIDE 0.05%) solution 7 mL  7 mL Mouth Rinse BID Norval Morton, MD   7 mL at 09/07/15 2236  . diatrizoate meglumine-sodium (GASTROGRAFIN) 66-10 % solution 90 mL  90 mL Per NG tube Once Excell Seltzer, MD   90 mL at 09/06/15 2331  . diphenhydrAMINE (BENADRYL) injection 25-50 mg  25-50 mg Intravenous QHS PRN Norval Morton, MD   50 mg at 09/07/15 2237  . enoxaparin (LOVENOX) injection 40 mg  40 mg Subcutaneous Q24H Debbe Odea, MD   40 mg at 09/07/15 1812  . feeding supplement (ENSURE ENLIVE) (ENSURE ENLIVE) liquid 237 mL  237 mL Oral BID BM Debbe Odea, MD   237 mL at 09/08/15 1000  . fentaNYL (SUBLIMAZE) injection 25 mcg  25 mcg Intravenous Q4H PRN Debbe Odea, MD   25 mcg at 09/08/15 1244  . ipratropium-albuterol (DUONEB) 0.5-2.5 (3) MG/3ML nebulizer solution 3 mL  3 mL Nebulization Q4H PRN Rondell A Tamala Julian, MD      . levothyroxine (SYNTHROID, LEVOTHROID) injection 44 mcg  44 mcg Intravenous Daily Norval Morton, MD   44 mcg at 09/08/15 1038  . mometasone-formoterol (DULERA) 100-5 MCG/ACT inhaler 2 puff  2 puff Inhalation BID Norval Morton, MD   2 puff at 09/08/15 1116  . ondansetron (ZOFRAN) tablet 4 mg  4 mg Oral Q6H PRN Norval Morton, MD       Or  . ondansetron (ZOFRAN) injection 4 mg  4 mg Intravenous Q6H PRN Rondell A Tamala Julian, MD      . pantoprazole (PROTONIX) injection 40 mg  40 mg Intravenous Q12H Norval Morton, MD   40 mg at 09/08/15 1041  . prochlorperazine (COMPAZINE) injection 10 mg  10 mg Intravenous Q4H PRN Rondell A Tamala Julian, MD      . umeclidinium bromide (INCRUSE ELLIPTA) 62.5 MCG/INH 1 puff  1 puff Inhalation Daily Norval Morton, MD   1 puff at 09/08/15 1116     Discharge Medications: Please see discharge summary for a list of discharge medications.  Relevant Imaging Results:  Relevant Lab Results:   Additional Information SS # [id number]  Standley Brooking, LCSW

## 2015-09-08 NOTE — Progress Notes (Signed)
[facility] Progress Note   [name]  GUR:[id number]  DOB: 1931/01/24  DOA: 09/06/2015 PCP: Junie Panning, NP  Brief narrative: [name] is a 82 y.o. female COPD, suspected lung CA, oxygen dependent on 2 L, anxiety, chronic [place] anemia, CAD, PVD, HTN, and carotid artery occlusion; who presents with complaints of a 2 day history of nausea, vomiting, and abdominal pain. She was admitted for treatment of SBO.    Subjective: Many BMs yesterday. None today. Tolerating full liquids  Assessment/Plan: Principal Problem:   SBO (small bowel obstruction) - nausea/ vomiting/ abdominal pain - continue conservative management- surgery assisting- NG tube was not able to be placed but she is no longer vomiting, had a BM and was able to drink gastrograffin-- had multiple BMs yesterday- on full liquids now with plans to advance to soft diet  Active Problems: Hypokalemia - replacing    Hypothyroidism - synthroid    COPD (chronic obstructive pulmonary disease) - stable- has O2 which she uses at home "as needed"    Hypokalemia - replaced    S/P hip replacement    Antibiotics: Anti-infectives    None     Code Status:     Code Status Orders        Start     Ordered   09/06/15 2136  Do not attempt resuscitation (DNR)   Continuous    Question Answer Comment  In the event of cardiac or respiratory ARREST Do not call a "code blue"   In the event of cardiac or respiratory ARREST Do not perform Intubation, CPR, defibrillation or ACLS   In the event of cardiac or respiratory ARREST Use medication by any route, position, wound care, and other measures to relive pain and suffering. May use oxygen, suction and manual treatment of airway obstruction as needed for comfort.      09/06/15 2142    Code Status History    Date Active Date Inactive Code Status Order ID Comments User Context   08/17/2015  3:17 AM 08/22/2015  6:30 PM DNR [id number]  Norval Morton, MD  Inpatient   05/29/2015  9:25 PM 05/31/2015  5:02 PM DNR [id number]  Vianne Bulls, MD ED   11/06/2013  8:28 PM 05/29/2015  9:25 PM DNR [id number]  Hennie Duos, MD Outpatient   11/01/2013  8:33 PM 11/04/2013  6:27 PM DNR [id number]  Berle Mull, MD ED   09/16/2013  2:58 PM 09/21/2013  5:24 PM DNR [id number]  Kinnie Feil, MD Inpatient   09/14/2013 10:33 PM 09/16/2013  2:58 PM Full Code [id number]  Etta Quill, DO ED    Advance Directive Documentation        Most Recent Value   Type of Advance Directive  Out of facility DNR (pink MOST or yellow form)   Pre-existing out of facility DNR order (yellow form or pink MOST form)     "MOST" Form in Place?       Family Communication:   Disposition Plan: SNF in 1-2 days DVT prophylaxis: Lovenox Consultants: gen surgery  Procedures:     Objective: Filed Weights   09/06/15 2125  Weight: 46.3 kg (102 lb 1.2 oz)    Intake/Output Summary (Last 24 hours) at 09/08/15 1342 Last data filed at 09/08/15 1139  Gross per 24 hour  Intake 2575.84 ml  Output    150 ml  Net 2425.84 ml     Vitals Filed Vitals:   09/07/15 2237 09/08/15 0548 09/08/15 1116  09/08/15 1330  BP: 131/64 112/49  125/68  Pulse: 99 73  88  Temp: 98.4 F (36.9 C) 98.7 F (37.1 C)  97.7 F (36.5 C)  TempSrc: Oral Oral  Oral  Resp: '18 18  20  '$ Height:      Weight:      SpO2: 98% 99% 99% 100%    Exam:  General:  Pt is alert, not in acute distress  HEENT: No icterus, No thrush, oral mucosa moist  Cardiovascular: regular rate and rhythm, S1/S2 No murmur  Respiratory: clear to auscultation bilaterally   Abdomen: Soft, +Bowel sounds, non tender, non distended, no guarding  MSK: No cyanosis or clubbing- no pedal edema   Data Reviewed: Basic Metabolic Panel:  Recent Labs Lab 09/06/15 1746 09/07/15 0458 09/08/15 0516  NA 139 144 142  K 3.3* 4.0 2.8*  CL 99* 111 111  CO2 29 25 17*  GLUCOSE 140* 106* 58*  BUN 22* 17 18  CREATININE 0.69 0.73 0.69  CALCIUM  9.2 8.4* 8.4*   Liver Function Tests:  Recent Labs Lab 09/06/15 1746  AST 26  ALT 20  ALKPHOS 105  BILITOT 1.0  PROT 6.9  ALBUMIN 3.9    Recent Labs Lab 09/06/15 1746  LIPASE 20   No results for input(s): AMMONIA in the last 168 hours. CBC:  Recent Labs Lab 09/06/15 1746 09/07/15 0458 09/08/15 0516  WBC 12.8* 7.7 8.9  HGB 12.1 10.0* 9.8*  HCT 37.3 31.7* 31.5*  MCV 94.2 95.8 98.4  PLT 484* 391 333   Cardiac Enzymes: No results for input(s): CKTOTAL, CKMB, CKMBINDEX, TROPONINI in the last 168 hours. BNP (last 3 results)  Recent Labs  05/31/15 0553  BNP 255.9*    ProBNP (last 3 results) No results for input(s): PROBNP in the last 8760 hours.  CBG: No results for input(s): GLUCAP in the last 168 hours.  No results found for this or any previous visit (from the past 240 hour(s)).   Studies: Ct Abdomen Pelvis W Contrast  09/06/2015  CLINICAL DATA:  Generalized abdominal pain with nausea and vomiting EXAM: CT ABDOMEN AND PELVIS WITH CONTRAST TECHNIQUE: Multidetector CT imaging of the abdomen and pelvis was performed using the standard protocol following bolus administration of intravenous contrast. CONTRAST:  164m ISOVUE-300 IOPAMIDOL (ISOVUE-300) INJECTION 61% COMPARISON:  Aug 22, 2015 FINDINGS: Lower chest: There is mild bibasilar atelectatic change. There is the pericardial effusion. There is atherosclerotic calcification in the distal aorta. Hepatobiliary: No focal liver lesions are identified. There is focal fatty infiltration near the fissure for the ligamentum teres. Small gallstones are noted within the gallbladder. The gallbladder wall is not thickened. Mild intrahepatic and extrahepatic biliary duct dilatation is stable compared to recent prior study. No biliary duct mass or calculus is evident. Pancreas: There is no demonstrable pancreatic mass or inflammatory focus. Pancreas is rather atrophic, stable. Spleen: No splenic lesions are evident.  Adrenals/Urinary Tract: Adrenals appear unremarkable bilaterally. Areas of scarring in the left kidney are stable. Multiple foci of arterial vascular calcification is noted in each kidney. There is a cyst in the posterior lower pole right kidney measuring 7 x 7 mm. There is a 1 x 1 cm cyst in the lower pole left kidney. There is no hydronephrosis on either side. No renal or ureteral calculi are identified. Urinary bladder is midline with wall thickness within normal limits. Stomach/Bowel: There is dilatation of the stomach and proximal small bowel. There is a transition zone at the junction of the mid  and distal portions of the jejunum consistent with a degree of small bowel obstruction. No free air or portal venous air. There is no appreciable bowel wall or mesenteric thickening. There are multiple sigmoid diverticula without diverticulitis. Vascular/Lymphatic: There is atherosclerotic calcification in the aorta and iliac arteries. Peripheral thrombus is noted in the distal aorta. There is no frank abdominal aortic aneurysm. Major mesenteric vessels appear patent. No adenopathy is apparent in the abdomen or pelvis. Reproductive: Uterus is anteverted. There is no pelvic mass. There is free fluid in the cul-de-sac region. Other: There is no abscess apparent in the abdomen or pelvis. There is no periappendiceal inflammation. Musculoskeletal: There is upper lumbar levoscoliosis. There is extensive arthropathy in the lumbar spine. There is vacuum phenomenon at L4-5 and L5-S1. There is evidence of a prior comminuted right proximal femur fracture with screw and plate fixation in this area. No acute fracture is evident. Bones are osteoporotic. There are no evident blastic or lytic bone lesions. There is no intramuscular or abdominal wall lesion. IMPRESSION: Small bowel obstruction with transition zone at the level of the mid to distal jejunum. No free air. Multiple sigmoid diverticula without diverticulitis. Mild ascites  in the dependent portion of the pelvis.  No abscess. Small gallstones within the gallbladder. Stable biliary duct dilatation without biliary duct mass or calculus seen. Atrophic pancreas without focal pancreatic lesion. Extensive atherosclerotic calcification. Prior right proximal femur fracture with postoperative change. Bones osteoporotic. Extensive arthropathy in the lumbar region, most severe at L4-5 and L5-S1. These results were called by telephone at the time of interpretation on 09/06/2015 at 7:33 pm to [facility], [facility] , who verbally acknowledged these results. Electronically Signed   By: Lowella Grip III M.D.   On: 09/06/2015 19:34   Dg Chest Port 1 View  09/06/2015  CLINICAL DATA:  Abdominal pain, weakness, possible surgery for small bowel obstruction EXAM: PORTABLE CHEST 1 VIEW COMPARISON:  CT chest dated 08/21/2015 FINDINGS: Lungs are essentially clear. Recent ground-glass nodular opacities on CT are not radiographically evident. No pleural effusion or pneumothorax. The heart is normal in size. IMPRESSION: No evidence of acute cardiopulmonary disease. Recent ground-glass nodular opacities on CT are not radiographically evident. Electronically Signed   By: Julian Hy M.D.   On: 09/06/2015 21:54   Dg Abd 2 Views  09/08/2015  CLINICAL DATA:  Small bowel obstruction EXAM: ABDOMEN - 2 VIEW COMPARISON:  09/07/15 FINDINGS: Oral contrast is seen throughout the large bowel and into the rectum. There remain some mildly distended loops of small bowel. No free air is identified. There are air-fluid levels seen diffusely. IMPRESSION: Diffuse air-fluid levels could indicate ileus. Although there are a few mildly distended loops of small bowel, contrast is seen into the rectum. Electronically Signed   By: Skipper Cliche M.D.   On: 09/08/2015 09:55   Dg Abd Portable 1v-small Bowel Obstruction Protocol-initial, 8 Hr Delay  09/07/2015  CLINICAL DATA:  Small bowel obstruction EXAM: PORTABLE ABDOMEN - 1  VIEW COMPARISON:  Single view of the abdomen from earlier today FINDINGS: The patient i had s been given oral contrast in the interval. Contrast is seen within the colon after an 8 hour delay. Diverticuli are identified. Air-filled small bowel loops continue to improve with no convincing evidence of persistent obstruction. No other acute abnormalities. IMPRESSION: Contrast seen in the colon. No convincing evidence of remaining small bowel obstruction. Electronically Signed   By: Dorise Bullion III M.D   On: 09/07/2015 18:33   Dg  Abd Portable 1v  09/07/2015  CLINICAL DATA:  Small bowel obstruction EXAM: PORTABLE ABDOMEN - 1 VIEW COMPARISON:  Yesterday FINDINGS: Dilated small bowel loops and nondilated gas-filled large bowel are scattered across the abdomen. Gas-filled small bowel loops have become less prominent. Iodinated contrast fills the bladder. No obvious free intraperitoneal gas. Osteopenia. Degenerative changes in the hip joints and lumbar spine with levoscoliosis. IMPRESSION: Improved partial small bowel obstruction pattern. Electronically Signed   By: Marybelle Killings M.D.   On: 09/07/2015 07:27    Scheduled Meds:  Scheduled Meds: . antiseptic oral rinse  7 mL Mouth Rinse BID  . diatrizoate meglumine-sodium  90 mL Per NG tube Once  . enoxaparin (LOVENOX) injection  40 mg Subcutaneous Q24H  . feeding supplement (ENSURE ENLIVE)  237 mL Oral BID BM  . levothyroxine  44 mcg Intravenous Daily  . mometasone-formoterol  2 puff Inhalation BID  . pantoprazole (PROTONIX) IV  40 mg Intravenous Q12H  . potassium chloride  20 mEq Oral Once  . umeclidinium bromide  1 puff Inhalation Daily   Continuous Infusions: . sodium chloride 75 mL/hr at 09/08/15 1045    Time spent on care of this patient: 35 min   [place], MD 09/08/2015, 1:42 PM  LOS: 1 day   [facility] Office  [phone] Pager - Text Page per [email or web address] If 7PM-7AM, please contact night-coverage  [email or web address]

## 2015-09-08 NOTE — Progress Notes (Signed)
Subjective: Some pain in LUQ.  Multiple BMs.  Objective: Vital signs in last 24 hours: Temp:  [97.6 F (36.4 C)-98.7 F (37.1 C)] 98.7 F (37.1 C) (06/01 0548) Pulse Rate:  [73-99] 73 (06/01 0548) Resp:  [18-20] 18 (06/01 0548) BP: (112-145)/(49-64) 112/49 mmHg (06/01 0548) SpO2:  [98 %-99 %] 99 % (06/01 0548) Last BM Date: 09/06/15  Intake/Output from previous day: 05/31 0701 - 06/01 0700 In: 872.9 [I.V.:872.9] Out: 120 [Urine:120] Intake/Output this shift:    PE: General- In NAD Abdomen-soft, not tender, active bowel sounds  Lab Results:   Recent Labs  09/07/15 0458 09/08/15 0516  WBC 7.7 8.9  HGB 10.0* 9.8*  HCT 31.7* 31.5*  PLT 391 333   BMET  Recent Labs  09/07/15 0458 09/08/15 0516  NA 144 142  K 4.0 2.8*  CL 111 111  CO2 25 17*  GLUCOSE 106* 58*  BUN 17 18  CREATININE 0.73 0.69  CALCIUM 8.4* 8.4*   PT/INR No results for input(s): LABPROT, INR in the last 72 hours. Comprehensive Metabolic Panel:    Component Value Date/Time   NA 142 09/08/2015 0516   NA 144 09/07/2015 0458   K 2.8* 09/08/2015 0516   K 4.0 09/07/2015 0458   CL 111 09/08/2015 0516   CL 111 09/07/2015 0458   CO2 17* 09/08/2015 0516   CO2 25 09/07/2015 0458   BUN 18 09/08/2015 0516   BUN 17 09/07/2015 0458   CREATININE 0.69 09/08/2015 0516   CREATININE 0.73 09/07/2015 0458   GLUCOSE 58* 09/08/2015 0516   GLUCOSE 106* 09/07/2015 0458   CALCIUM 8.4* 09/08/2015 0516   CALCIUM 8.4* 09/07/2015 0458   AST 26 09/06/2015 1746   AST 21 05/29/2015 1735   ALT 20 09/06/2015 1746   ALT 13* 05/29/2015 1735   ALKPHOS 105 09/06/2015 1746   ALKPHOS 59 05/29/2015 1735   BILITOT 1.0 09/06/2015 1746   BILITOT 0.4 05/29/2015 1735   PROT 6.9 09/06/2015 1746   PROT 6.3* 05/29/2015 1735   ALBUMIN 3.9 09/06/2015 1746   ALBUMIN 4.0 05/29/2015 1735     Studies/Results: Ct Abdomen Pelvis W Contrast  09/06/2015  CLINICAL DATA:  Generalized abdominal pain with nausea and vomiting  EXAM: CT ABDOMEN AND PELVIS WITH CONTRAST TECHNIQUE: Multidetector CT imaging of the abdomen and pelvis was performed using the standard protocol following bolus administration of intravenous contrast. CONTRAST:  125m ISOVUE-300 IOPAMIDOL (ISOVUE-300) INJECTION 61% COMPARISON:  Aug 22, 2015 FINDINGS: Lower chest: There is mild bibasilar atelectatic change. There is the pericardial effusion. There is atherosclerotic calcification in the distal aorta. Hepatobiliary: No focal liver lesions are identified. There is focal fatty infiltration near the fissure for the ligamentum teres. Small gallstones are noted within the gallbladder. The gallbladder wall is not thickened. Mild intrahepatic and extrahepatic biliary duct dilatation is stable compared to recent prior study. No biliary duct mass or calculus is evident. Pancreas: There is no demonstrable pancreatic mass or inflammatory focus. Pancreas is rather atrophic, stable. Spleen: No splenic lesions are evident. Adrenals/Urinary Tract: Adrenals appear unremarkable bilaterally. Areas of scarring in the left kidney are stable. Multiple foci of arterial vascular calcification is noted in each kidney. There is a cyst in the posterior lower pole right kidney measuring 7 x 7 mm. There is a 1 x 1 cm cyst in the lower pole left kidney. There is no hydronephrosis on either side. No renal or ureteral calculi are identified. Urinary bladder is midline with wall thickness within normal limits. Stomach/Bowel:  There is dilatation of the stomach and proximal small bowel. There is a transition zone at the junction of the mid and distal portions of the jejunum consistent with a degree of small bowel obstruction. No free air or portal venous air. There is no appreciable bowel wall or mesenteric thickening. There are multiple sigmoid diverticula without diverticulitis. Vascular/Lymphatic: There is atherosclerotic calcification in the aorta and iliac arteries. Peripheral thrombus is noted  in the distal aorta. There is no frank abdominal aortic aneurysm. Major mesenteric vessels appear patent. No adenopathy is apparent in the abdomen or pelvis. Reproductive: Uterus is anteverted. There is no pelvic mass. There is free fluid in the cul-de-sac region. Other: There is no abscess apparent in the abdomen or pelvis. There is no periappendiceal inflammation. Musculoskeletal: There is upper lumbar levoscoliosis. There is extensive arthropathy in the lumbar spine. There is vacuum phenomenon at L4-5 and L5-S1. There is evidence of a prior comminuted right proximal femur fracture with screw and plate fixation in this area. No acute fracture is evident. Bones are osteoporotic. There are no evident blastic or lytic bone lesions. There is no intramuscular or abdominal wall lesion. IMPRESSION: Small bowel obstruction with transition zone at the level of the mid to distal jejunum. No free air. Multiple sigmoid diverticula without diverticulitis. Mild ascites in the dependent portion of the pelvis.  No abscess. Small gallstones within the gallbladder. Stable biliary duct dilatation without biliary duct mass or calculus seen. Atrophic pancreas without focal pancreatic lesion. Extensive atherosclerotic calcification. Prior right proximal femur fracture with postoperative change. Bones osteoporotic. Extensive arthropathy in the lumbar region, most severe at L4-5 and L5-S1. These results were called by telephone at the time of interpretation on 09/06/2015 at 7:33 pm to [facility] , who verbally acknowledged these results. Electronically Signed   By: Lowella Grip III M.D.   On: 09/06/2015 19:34   Dg Chest Port 1 View  09/06/2015  CLINICAL DATA:  Abdominal pain, weakness, possible surgery for small bowel obstruction EXAM: PORTABLE CHEST 1 VIEW COMPARISON:  CT chest dated 08/21/2015 FINDINGS: Lungs are essentially clear. Recent ground-glass nodular opacities on CT are not radiographically evident. No pleural  effusion or pneumothorax. The heart is normal in size. IMPRESSION: No evidence of acute cardiopulmonary disease. Recent ground-glass nodular opacities on CT are not radiographically evident. Electronically Signed   By: Julian Hy M.D.   On: 09/06/2015 21:54   Dg Abd Portable 1v-small Bowel Obstruction Protocol-initial, 8 Hr Delay  09/07/2015  CLINICAL DATA:  Small bowel obstruction EXAM: PORTABLE ABDOMEN - 1 VIEW COMPARISON:  Single view of the abdomen from earlier today FINDINGS: The patient i had s been given oral contrast in the interval. Contrast is seen within the colon after an 8 hour delay. Diverticuli are identified. Air-filled small bowel loops continue to improve with no convincing evidence of persistent obstruction. No other acute abnormalities. IMPRESSION: Contrast seen in the colon. No convincing evidence of remaining small bowel obstruction. Electronically Signed   By: Dorise Bullion III M.D   On: 09/07/2015 18:33   Dg Abd Portable 1v  09/07/2015  CLINICAL DATA:  Small bowel obstruction EXAM: PORTABLE ABDOMEN - 1 VIEW COMPARISON:  Yesterday FINDINGS: Dilated small bowel loops and nondilated gas-filled large bowel are scattered across the abdomen. Gas-filled small bowel loops have become less prominent. Iodinated contrast fills the bladder. No obvious free intraperitoneal gas. Osteopenia. Degenerative changes in the hip joints and lumbar spine with levoscoliosis. IMPRESSION: Improved partial small bowel obstruction pattern.  Electronically Signed   By: Marybelle Killings M.D.   On: 09/07/2015 07:27    Anti-infectives: Anti-infectives    None      Assessment Principal Problem:   SBO (small bowel obstruction) (HCC)-clinically and radiographically improving Active Problems:   COPD (chronic obstructive pulmonary disease) (HCC)   Hypokalemia   S/P hip replacement    LOS: 1 day   Plan: Full liquid diet.  If tolerated could advance to soft diet.   [name] J 09/08/2015

## 2015-09-09 DIAGNOSIS — Z9660 Presence of unspecified orthopedic joint implant: Secondary | ICD-10-CM

## 2015-09-09 DIAGNOSIS — E039 Hypothyroidism, unspecified: Secondary | ICD-10-CM

## 2015-09-09 LAB — BASIC METABOLIC PANEL
ANION GAP: 8 (ref 5–15)
BUN: 14 mg/dL (ref 6–20)
CALCIUM: 8.5 mg/dL — AB (ref 8.9–10.3)
CHLORIDE: 109 mmol/L (ref 101–111)
CO2: 22 mmol/L (ref 22–32)
Creatinine, Ser: 0.58 mg/dL (ref 0.44–1.00)
GFR calc non Af Amer: >60 mL/min (ref >60)
GLUCOSE: 91 mg/dL (ref 65–99)
POTASSIUM: 3 mmol/L — AB (ref 3.5–5.1)
Sodium: 139 mmol/L (ref 135–145)

## 2015-09-09 LAB — MAGNESIUM: Magnesium: 1.9 mg/dL (ref 1.7–2.4)

## 2015-09-09 MED ORDER — POTASSIUM CHLORIDE CRYS ER 20 MEQ PO TBCR
40.0000 meq | EXTENDED_RELEASE_TABLET | ORAL | Status: AC
  Administered 2015-09-09 (×2): 40 meq via ORAL
  Filled 2015-09-09 (×2): qty 2

## 2015-09-09 NOTE — Progress Notes (Signed)
Patient is set to discharge back to [facility] today. Patient & daughter, [name] made aware. Discharge packet given to RN, Ify. [facility] called for transport.     Raynaldo Opitz, [facility] Clinical Social Worker cell #: [phone]

## 2015-09-09 NOTE — Progress Notes (Signed)
Patient discharged back to [facility] via ambulance, report given to [place]. Patient alert and oriented, in no distress. Skin intact, no wound noted.

## 2015-09-09 NOTE — Progress Notes (Signed)
Subjective: Tolerating full liquid diet without any problem.  Passing gas.  Bowels moving.  Objective: Vital signs in last 24 hours: Temp:  [97.7 F (36.5 C)-98.4 F (36.9 C)] 98.2 F (36.8 C) (06/02 0634) Pulse Rate:  [65-88] 65 (06/02 0634) Resp:  [20] 20 (06/02 0634) BP: (111-125)/(50-68) 111/65 mmHg (06/02 0634) SpO2:  [98 %-100 %] 98 % (06/02 0634) Last BM Date: 09/06/15  Intake/Output from previous day: 09-12-22 0701 - 06/02 0700 In: 2674.2 [P.O.:120; I.V.:2254.2; IV Piggyback:300] Out: 150 [Urine:150] Intake/Output this shift:    PE: General- In NAD Abdomen-soft, not tender, active bowel sounds  Lab Results:   Recent Labs  09/07/15 0458 12-Sep-2015 0516  WBC 7.7 8.9  HGB 10.0* 9.8*  HCT 31.7* 31.5*  PLT 391 333   BMET  Recent Labs  12-Sep-2015 0516 09/09/15 0446  NA 142 139  K 2.8* 3.0*  CL 111 109  CO2 17* 22  GLUCOSE 58* 91  BUN 18 14  CREATININE 0.69 0.58  CALCIUM 8.4* 8.5*   PT/INR No results for input(s): LABPROT, INR in the last 72 hours. Comprehensive Metabolic Panel:    Component Value Date/Time   NA 139 09/09/2015 0446   NA 142 09-12-15 0516   K 3.0* 09/09/2015 0446   K 2.8* 09/12/15 0516   CL 109 09/09/2015 0446   CL 111 09-12-15 0516   CO2 22 09/09/2015 0446   CO2 17* 09-12-2015 0516   BUN 14 09/09/2015 0446   BUN 18 2015/09/12 0516   CREATININE 0.58 09/09/2015 0446   CREATININE 0.69 09-12-2015 0516   GLUCOSE 91 09/09/2015 0446   GLUCOSE 58* 09/12/2015 0516   CALCIUM 8.5* 09/09/2015 0446   CALCIUM 8.4* September 12, 2015 0516   AST 26 09/06/2015 1746   AST 21 05/29/2015 1735   ALT 20 09/06/2015 1746   ALT 13* 05/29/2015 1735   ALKPHOS 105 09/06/2015 1746   ALKPHOS 59 05/29/2015 1735   BILITOT 1.0 09/06/2015 1746   BILITOT 0.4 05/29/2015 1735   PROT 6.9 09/06/2015 1746   PROT 6.3* 05/29/2015 1735   ALBUMIN 3.9 09/06/2015 1746   ALBUMIN 4.0 05/29/2015 1735     Studies/Results: Dg Abd 2 Views  12-Sep-2015  CLINICAL DATA:   Small bowel obstruction EXAM: ABDOMEN - 2 VIEW COMPARISON:  09/07/15 FINDINGS: Oral contrast is seen throughout the large bowel and into the rectum. There remain some mildly distended loops of small bowel. No free air is identified. There are air-fluid levels seen diffusely. IMPRESSION: Diffuse air-fluid levels could indicate ileus. Although there are a few mildly distended loops of small bowel, contrast is seen into the rectum. Electronically Signed   By: Skipper Cliche M.D.   On: September 12, 2015 09:55   Dg Abd Portable 1v-small Bowel Obstruction Protocol-initial, 8 Hr Delay  09/07/2015  CLINICAL DATA:  Small bowel obstruction EXAM: PORTABLE ABDOMEN - 1 VIEW COMPARISON:  Single view of the abdomen from earlier today FINDINGS: The patient i had s been given oral contrast in the interval. Contrast is seen within the colon after an 8 hour delay. Diverticuli are identified. Air-filled small bowel loops continue to improve with no convincing evidence of persistent obstruction. No other acute abnormalities. IMPRESSION: Contrast seen in the colon. No convincing evidence of remaining small bowel obstruction. Electronically Signed   By: Dorise Bullion III M.D   On: 09/07/2015 18:33    Anti-infectives: Anti-infectives    None      Assessment Principal Problem:   SBO (small bowel obstruction) (HCC)-resolved. Active  Problems:   COPD (chronic obstructive pulmonary disease) (HCC)   Hypokalemia   S/P hip replacement    LOS: 2 days   Plan: Soft diet.  Please call if we can be of further assistance.   Renika Shiflet J 09/09/2015

## 2015-09-09 NOTE — Discharge Summary (Signed)
Physician Discharge Summary  [name] TKW:[id number] DOB: [birth year] DOA: 09/06/2015  PCP: Junie Panning, NP  Admit date: 09/06/2015 Discharge date: 09/09/2015  Time spent: 55 minutes  Recommendations for Outpatient Follow-up:  1. Return to SNF  Discharge Condition: stable    Discharge Diagnoses:  Principal Problem:   SBO (small bowel obstruction) (HCC) Active Problems:   Hypothyroidism   COPD (chronic obstructive pulmonary disease) (HCC)   Nausea and vomiting   Hypokalemia   S/P hip replacement   History of present illness:  [name] is a 82 y.o. female COPD, suspected lung CA, oxygen dependent on 2 L, anxiety, chronic [place] anemia, CAD, PVD, HTN, and carotid artery occlusion; who presents with complaints of a 2 day history of nausea, vomiting, and abdominal pain. She was admitted for treatment of SBO.   Hospital Course:  Principal Problem:  SBO (small bowel obstruction) - nausea/ vomiting/ abdominal pain -  conservative management- surgery assisting- NG tube was not able to be placed for 2 attempts and she subsequently refused it- no further vomiting, had a BM and was able to drink gastrograffin-- has had multiple BMs in past 2 days and is tolerating solid food today  Active Problems: Hypokalemia - replacing    Hypothyroidism - synthroid   COPD (chronic obstructive pulmonary disease) - stable- has O2 which she uses at home "as needed"   Hypokalemia - replaced   S/P hip replacement   Consultations:  gen surgery   Discharge Exam: [facility] Weights   09/06/15 2125  Weight: 46.3 kg (102 lb 1.2 oz)   Filed Vitals:   09/08/15 2042 09/09/15 0634  BP: 118/50 111/65  Pulse: 87 65  Temp: 98.4 F (36.9 C) 98.2 F (36.8 C)  Resp: 20 20    General: AAO x 3, no distress Cardiovascular: RRR, no murmurs  Respiratory: clear to auscultation bilaterally GI: soft, non-tender, non-distended, bowel sound positive  Discharge Instructions You were  cared for by a hospitalist during your hospital stay. If you have any questions about your discharge medications or the care you received while you were in the hospital after you are discharged, you can call the unit and asked to speak with the hospitalist on call if the hospitalist that took care of you is not available. Once you are discharged, your primary care physician will handle any further medical issues. Please note that NO REFILLS for any discharge medications will be authorized once you are discharged, as it is imperative that you return to your primary care physician (or establish a relationship with a primary care physician if you do not have one) for your aftercare needs so that they can reassess your need for medications and monitor your lab values.     Medication List    ASK your doctor about these medications        busPIRone 15 MG tablet  Commonly known as:  BUSPAR  Take 15 mg by mouth 2 (two) times daily.     citalopram 20 MG tablet  Commonly known as:  CELEXA  Take 20 mg by mouth daily.     docusate sodium 100 MG capsule  Commonly known as:  COLACE  Take 1 capsule (100 mg total) by mouth 2 (two) times daily.     feeding supplement (ENSURE ENLIVE) Liqd  Take 237 mLs by mouth 3 (three) times daily between meals.     felodipine 5 MG 24 hr tablet  Commonly known as:  PLENDIL  Take 10 mg  by mouth daily.     hydrOXYzine 25 MG tablet  Commonly known as:  ATARAX/VISTARIL  Take 25 mg by mouth daily as needed (sleep).     INCRUSE ELLIPTA 62.5 MCG/INH Aepb  Generic drug:  umeclidinium bromide  Inhale 1 puff into the lungs daily. Rinse mouth out with water after use. (COPD )     ipratropium-albuterol 0.5-2.5 (3) MG/3ML Soln  Commonly known as:  DUONEB  Take 3 mLs by nebulization every 6 (six) hours as needed (shortness of breath).     levothyroxine 88 MCG tablet  Commonly known as:  SYNTHROID, LEVOTHROID  Take 88 mcg by mouth daily.     methocarbamol 500 MG tablet   Commonly known as:  ROBAXIN  Take 1,000 mg by mouth every 6 (six) hours.     mometasone-formoterol 100-5 MCG/ACT Aero  Commonly known as:  DULERA  Inhale 2 puffs into the lungs 2 (two) times daily.     nitroGLYCERIN 0.4 MG SL tablet  Commonly known as:  NITROSTAT  Place 0.4 mg under the tongue every 5 (five) minutes as needed for chest pain.     omeprazole 20 MG capsule  Commonly known as:  PRILOSEC  Take 1 capsule (20 mg total) by mouth daily.     oxyCODONE-acetaminophen 5-325 MG tablet  Commonly known as:  PERCOCET/ROXICET  Take 1 tablet by mouth every 8 (eight) hours as needed.     potassium chloride 10 MEQ CR capsule  Commonly known as:  MICRO-K  Take 10 mEq by mouth daily.     tiotropium 18 MCG inhalation capsule  Commonly known as:  SPIRIVA  Place 1 capsule (18 mcg total) into inhaler and inhale daily.     zolpidem 5 MG tablet  Commonly known as:  AMBIEN  Take 1 tablet (5 mg total) by mouth at bedtime as needed for sleep.       Allergies  Allergen Reactions  . Aspirin     GI  Bleed  . Penicillins     Numbness around mouth  Has patient had a PCN reaction causing immediate rash, facial/tongue/throat swelling, SOB or lightheadedness with hypotension: Yes Has patient had a PCN reaction causing severe rash involving mucus membranes or skin necrosis: No Has patient had a PCN reaction that required hospitalization No Has patient had a PCN reaction occurring within the last 10 years: No If all of the above answers are "NO", then may proceed with Cephalosporin use.    . Biaxin [Clarithromycin] Nausea Only  . Sulfonamide Derivatives Nausea And Vomiting  . Buprenorphine Nausea And Vomiting  . Codeine Nausea And Vomiting  . Hydrocodone Nausea And Vomiting  . Morphine And Related Nausea Only    Nausea   . Sulfa Antibiotics Nausea And Vomiting  . Xanax [Alprazolam]     unknown      The results of significant diagnostics from this hospitalization (including  imaging, microbiology, ancillary and laboratory) are listed [place] for reference.    Significant Diagnostic Studies: Dg Chest 1 View  08/19/2015  CLINICAL DATA:  82 year old female with shortness of breath. Right hip fracture. EXAM: CHEST 1 VIEW COMPARISON:  Chest x-ray 08/17/2015. FINDINGS: Mild emphysematous changes are noted throughout the lungs bilaterally. No acute consolidative airspace disease. No pleural effusions. No evidence of pulmonary edema. Heart size is normal. New fullness in the right hilar region. Atherosclerosis in the thoracic aorta. IMPRESSION: 1. New fullness in the right hilar region. This is favored to be vascular, and could represent an  acutely enlarged pulmonary artery (e.g., "Fleischner sign" associated with pulmonary embolism). Given the patient's history, clinical correlation for signs and symptoms of pulmonary embolism is suggested, with consideration for further evaluation with PE protocol CT scan if clinically appropriate. 2. Atherosclerosis. These results will be called to the ordering clinician or representative by the Radiologist Assistant, and communication documented in the PACS or zVision Dashboard. Electronically Signed   By: Vinnie Langton M.D.   On: 08/19/2015 19:57   Dg Chest 2 View  08/17/2015  CLINICAL DATA:  Dyspnea.  Hip fracture. EXAM: CHEST  2 VIEW COMPARISON:  None. FINDINGS: A single supine AP view of the chest is negative for pneumothorax or large effusion. Mediastinal contours are normal. The lungs are clear. The pulmonary vasculature is normal. IMPRESSION: No acute cardiopulmonary findings. Electronically Signed   By: Andreas Newport M.D.   On: 08/17/2015 01:17   Ct Head Wo Contrast  08/17/2015  CLINICAL DATA:  [place] 3 hours ago. EXAM: CT HEAD WITHOUT CONTRAST TECHNIQUE: Contiguous axial images were obtained from the base of the skull through the vertex without intravenous contrast. COMPARISON:  11/03/2013 FINDINGS: There is no intracranial hemorrhage,  mass or evidence of acute infarction. There is moderate generalized atrophy. There is moderate chronic microvascular ischemic change. There is remote lacunar infarction in the right caudate and right putamen. There is no significant extra-axial fluid collection. No acute intracranial findings are evident. The calvarium and skullbase are intact. The visible paranasal sinuses and orbits are unremarkable. IMPRESSION: No acute intracranial findings. There is moderate generalized atrophy, remote lacunar infarctions, and chronic appearing white matter hypodensities which likely represent small vessel ischemic disease. Electronically Signed   By: Andreas Newport M.D.   On: 08/17/2015 00:52   Ct Angio Chest Pe W/cm &/or Wo Cm  08/21/2015  CLINICAL DATA:  82 year old female with history of shortness of breath and clinical concern for potential pulmonary embolism. History of recent fall with right intertrochanteric hip fracture status post ORIF on 08/17/2015. EXAM: CT ANGIOGRAPHY CHEST WITH CONTRAST TECHNIQUE: Multidetector CT imaging of the chest was performed using the standard protocol during bolus administration of intravenous contrast. Multiplanar CT image reconstructions and MIPs were obtained to evaluate the vascular anatomy. CONTRAST:  100 mL of Isovue 370. COMPARISON:  Chest CT 11/10/2008. FINDINGS: Mediastinum/Lymph Nodes: Study is slightly limited by patient respiratory motion. Despite this limitation, there is no central, lobar or segmental sized filling defect within the pulmonary arteries to suggest clinically relevant pulmonary embolism. Smaller distal subsegmental sized pulmonary embolism cannot be entirely excluded. Heart size is borderline enlarged. Small amount of pericardial fluid and/or thickening, unlikely to be of any hemodynamic significance at this time. No associated pericardial calcification. There is atherosclerosis of the thoracic aorta, the great vessels of the mediastinum and the coronary  arteries, including calcified atherosclerotic plaque in the left main, left anterior descending and right coronary arteries. Mild calcifications of the aortic and mitral valve sparing No pathologically enlarged mediastinal or hilar lymph nodes. Esophagus is unremarkable in appearance. No axillary lymphadenopathy. Lungs/Pleura: Small bilateral pleural effusions lying dependently (right greater than left) with some associated passive subsegmental atelectasis in the lower lobes of the lungs bilaterally. Importantly, the previously noted nodular areas of ground-glass attenuation and architectural distortion in the upper lobes the lungs bilaterally have increased in size compared to prior study 11/10/2008. The lesion in the left apex currently measures 2.9 x 1.5 cm (image 19 of series 7) previously 2.1 x 0.8 cm on 11/10/2008, and appears as  a more dense area of ground-glass attenuation, septal thickening and demonstrates some internal bronchiolectasis, concerning potential adenocarcinoma. The lesion in the right apex which was previously a 1.3 cm pure ground-glass attenuation nodule currently measures 1.8 x 2.6 cm (image 25 of series 7) and has a central 8 mm solid component (image 17 of series 4), also concerning for potential adenocarcinoma. As well, there is a new peripheral ground-glass attenuation lesion measuring 2.8 x 1.5 cm in the right upper lobe (image 50 of series 7) which also has a central 12 mm solid component (image 34 of series 4), concerning for additional focus of disease. A few other scattered ill-defined areas of ground-glass attenuation are also noted. Mild diffuse bronchial wall thickening. Partial collapse of lower lobe bronchi during expiration, indicative of bronchomalacia. Upper Abdomen: Calcified granuloma in the periphery of segment 8 of the liver. Atherosclerosis. Musculoskeletal/Soft Tissues: There are no aggressive appearing lytic or blastic lesions noted in the visualized portions of the  skeleton. Review of the MIP images confirms the above findings. IMPRESSION: 1. Despite the mild limitations of today's examination there is no evidence to suggest clinically relevant central, lobar or segmental sized pulmonary embolism. 2. However, there are multiple pulmonary lesions bilaterally which have the appearance concerning for probable multifocal primary bronchogenic adenocarcinoma. While one of these lesions in the right upper lobe is new, the other 2 lesions in the apices of the upper lobes of the lungs bilaterally have clearly enlarged compared to remote prior study 11/10/2008. Further evaluation with nonemergent PET-CT is recommended in the near future for diagnostic and staging purposes. 3. Small bilateral pleural effusions (right greater than left) lying dependently with minimal passive subsegmental atelectasis in the lower lobes of the lungs bilaterally. 4. Evidence of bronchomalacia, as above. 5. Small amount of pericardial fluid and/or thickening, unlikely to be of hemodynamic significance at this time. 6. Atherosclerosis, including left main and 2 vessel coronary artery disease. These results will be called to the ordering clinician or representative by the Radiologist Assistant, and communication documented in the PACS or zVision Dashboard. Electronically Signed   By: Vinnie Langton M.D.   On: 08/21/2015 11:28   Ct Abdomen Pelvis W Contrast  09/06/2015  CLINICAL DATA:  Generalized abdominal pain with nausea and vomiting EXAM: CT ABDOMEN AND PELVIS WITH CONTRAST TECHNIQUE: Multidetector CT imaging of the abdomen and pelvis was performed using the standard protocol following bolus administration of intravenous contrast. CONTRAST:  187m ISOVUE-300 IOPAMIDOL (ISOVUE-300) INJECTION 61% COMPARISON:  Aug 22, 2015 FINDINGS: Lower chest: There is mild bibasilar atelectatic change. There is the pericardial effusion. There is atherosclerotic calcification in the distal aorta. Hepatobiliary: No focal  liver lesions are identified. There is focal fatty infiltration near the fissure for the ligamentum teres. Small gallstones are noted within the gallbladder. The gallbladder wall is not thickened. Mild intrahepatic and extrahepatic biliary duct dilatation is stable compared to recent prior study. No biliary duct mass or calculus is evident. Pancreas: There is no demonstrable pancreatic mass or inflammatory focus. Pancreas is rather atrophic, stable. Spleen: No splenic lesions are evident. Adrenals/Urinary Tract: Adrenals appear unremarkable bilaterally. Areas of scarring in the left kidney are stable. Multiple foci of arterial vascular calcification is noted in each kidney. There is a cyst in the posterior lower pole right kidney measuring 7 x 7 mm. There is a 1 x 1 cm cyst in the lower pole left kidney. There is no hydronephrosis on either side. No renal or ureteral calculi are identified. Urinary bladder  is midline with wall thickness within normal limits. Stomach/Bowel: There is dilatation of the stomach and proximal small bowel. There is a transition zone at the junction of the mid and distal portions of the jejunum consistent with a degree of small bowel obstruction. No free air or portal venous air. There is no appreciable bowel wall or mesenteric thickening. There are multiple sigmoid diverticula without diverticulitis. Vascular/Lymphatic: There is atherosclerotic calcification in the aorta and iliac arteries. Peripheral thrombus is noted in the distal aorta. There is no frank abdominal aortic aneurysm. Major mesenteric vessels appear patent. No adenopathy is apparent in the abdomen or pelvis. Reproductive: Uterus is anteverted. There is no pelvic mass. There is free fluid in the cul-de-sac region. Other: There is no abscess apparent in the abdomen or pelvis. There is no periappendiceal inflammation. Musculoskeletal: There is upper lumbar levoscoliosis. There is extensive arthropathy in the lumbar spine.  There is vacuum phenomenon at L4-5 and L5-S1. There is evidence of a prior comminuted right proximal femur fracture with screw and plate fixation in this area. No acute fracture is evident. Bones are osteoporotic. There are no evident blastic or lytic bone lesions. There is no intramuscular or abdominal wall lesion. IMPRESSION: Small bowel obstruction with transition zone at the level of the mid to distal jejunum. No free air. Multiple sigmoid diverticula without diverticulitis. Mild ascites in the dependent portion of the pelvis.  No abscess. Small gallstones within the gallbladder. Stable biliary duct dilatation without biliary duct mass or calculus seen. Atrophic pancreas without focal pancreatic lesion. Extensive atherosclerotic calcification. Prior right proximal femur fracture with postoperative change. Bones osteoporotic. Extensive arthropathy in the lumbar region, most severe at L4-5 and L5-S1. These results were called by telephone at the time of interpretation on 09/06/2015 at 7:33 pm to [facility] , who verbally acknowledged these results. Electronically Signed   By: Lowella Grip III M.D.   On: 09/06/2015 19:34   Ct Abdomen Pelvis W Contrast  08/22/2015  CLINICAL DATA:  82 year old with chest CT yesterday demonstrating enlarging semi-solid masses in both lungs worrisome for bronchogenic adenocarcinoma. Staging CT. EXAM: CT ABDOMEN AND PELVIS WITH CONTRAST TECHNIQUE: Multidetector CT imaging of the abdomen and pelvis was performed using the standard protocol following bolus administration of intravenous contrast. CONTRAST:  124m ISOVUE-300 IOPAMIDOL INJECTION 61% IV. Oral contrast was also administered. COMPARISON:  None. FINDINGS: Lower chest: Small bilateral pleural effusions, right greater than left, and associated passive atelectasis in the lower lobes. Very small pericardial effusion. Aortic annular and mitral annular calcification. Hepatobiliary: Liver normal in size and appearance.  Small gallstones within an otherwise normal appearing gallbladder. Mild intra and extrahepatic biliary ductal dilation, the common duct measuring up to approximately 12 mm diameter. No visible obstructing mass or stone within the duct. Pancreas: Atrophic, accounting for the mild pancreatic ductal dilation. No parenchymal mass or peripancreatic inflammation. Spleen: Normal in size and appearance. Adrenals/Urinary Tract: Normal appearing adrenal glands. Focal scarring involving the upper pole and mid pole of the left kidney. Subcentimeter cortical cyst involving the mid left kidney. No solid mass involving either kidney. No hydronephrosis. No urinary tract calculi. Atherosclerotic arterial calcification within both kidneys mimic calculi. Stomach/Bowel: Small hiatal hernia. Stomach decompressed and otherwise unremarkable. Normal-appearing small bowel. Diffuse colonic diverticulosis without evidence of acute diverticulitis. Colon otherwise unremarkable. Appendix not clearly visualized. No ascites. Vascular/Lymphatic: Severe aorto-iliofemoral atherosclerosis without aneurysm. No pathologic lymphadenopathy. Reproductive: Uterus atrophic as expected for age. No adnexal masses. Other: Edema involving the subcutaneous tissues of  the right lateral abdominal wall and right gluteal region. Musculoskeletal: Thoracolumbar levoscoliosis. Degenerative disc disease and spondylosis at every visualized lower thoracic and lumbar level with severe facet degenerative changes throughout the lumbar spine and severe multifactorial spinal stenosis at L4-5. IMPRESSION: 1. No evidence of metastatic disease involving the abdomen or pelvis. 2. No acute abnormalities involving the abdomen or pelvis. 3. Mild intra and extrahepatic biliary ductal dilation without visible obstructing stone or mass. 4. Small hiatal hernia. 5. Pancreatic atrophy. 6. Scarring involving the upper pole and mid pole of the left kidney. 7. Severe aortoiliofemoral  atherosclerosis without aneurysm. 8. Skeletal findings as above, most significantly severe multifactorial spinal stenosis at the L4-5 level. 9. Small bilateral pleural effusions, right greater than left, with associated passive atelectasis involving the lower lobes. Electronically Signed   By: Evangeline Dakin M.D.   On: 08/22/2015 12:46   Dg Chest Port 1 View  09/06/2015  CLINICAL DATA:  Abdominal pain, weakness, possible surgery for small bowel obstruction EXAM: PORTABLE CHEST 1 VIEW COMPARISON:  CT chest dated 08/21/2015 FINDINGS: Lungs are essentially clear. Recent ground-glass nodular opacities on CT are not radiographically evident. No pleural effusion or pneumothorax. The heart is normal in size. IMPRESSION: No evidence of acute cardiopulmonary disease. Recent ground-glass nodular opacities on CT are not radiographically evident. Electronically Signed   By: Julian Hy M.D.   On: 09/06/2015 21:54   Dg Abd 2 Views  09/08/2015  CLINICAL DATA:  Small bowel obstruction EXAM: ABDOMEN - 2 VIEW COMPARISON:  09/07/15 FINDINGS: Oral contrast is seen throughout the large bowel and into the rectum. There remain some mildly distended loops of small bowel. No free air is identified. There are air-fluid levels seen diffusely. IMPRESSION: Diffuse air-fluid levels could indicate ileus. Although there are a few mildly distended loops of small bowel, contrast is seen into the rectum. Electronically Signed   By: Skipper Cliche M.D.   On: 09/08/2015 09:55   Dg Abd Portable 1v-small Bowel Obstruction Protocol-initial, 8 Hr Delay  09/07/2015  CLINICAL DATA:  Small bowel obstruction EXAM: PORTABLE ABDOMEN - 1 VIEW COMPARISON:  Single view of the abdomen from earlier today FINDINGS: The patient i had s been given oral contrast in the interval. Contrast is seen within the colon after an 8 hour delay. Diverticuli are identified. Air-filled small bowel loops continue to improve with no convincing evidence of persistent  obstruction. No other acute abnormalities. IMPRESSION: Contrast seen in the colon. No convincing evidence of remaining small bowel obstruction. Electronically Signed   By: Dorise Bullion III M.D   On: 09/07/2015 18:33   Dg Abd Portable 1v  09/07/2015  CLINICAL DATA:  Small bowel obstruction EXAM: PORTABLE ABDOMEN - 1 VIEW COMPARISON:  Yesterday FINDINGS: Dilated small bowel loops and nondilated gas-filled large bowel are scattered across the abdomen. Gas-filled small bowel loops have become less prominent. Iodinated contrast fills the bladder. No obvious free intraperitoneal gas. Osteopenia. Degenerative changes in the hip joints and lumbar spine with levoscoliosis. IMPRESSION: Improved partial small bowel obstruction pattern. Electronically Signed   By: Marybelle Killings M.D.   On: 09/07/2015 07:27   Dg C-arm 1-60 Min-no Report  08/17/2015  CLINICAL DATA: right hip IM nail C-ARM 1-60 MINUTES Fluoroscopy was utilized by the requesting physician.  No radiographic interpretation.   Dg Hip Operative Unilat With Pelvis Right  08/17/2015  CLINICAL DATA:  RIGHT intra medullary nail fixation of intertrochanteric fracture EXAM: OPERATIVE RIGHT HIP (WITH PELVIS IF PERFORMED)  VIEWS TECHNIQUE: Fluoroscopic  spot image(s) were submitted for interpretation post-operatively. COMPARISON:  08/16/2015 FINDINGS: Intra medullary nail fixation of intertrochanteric fracture. Two compression screws noted. Distal nail normal. IMPRESSION: No complication following fine IM nail fixation of RIGHT intertrochanteric fracture. Electronically Signed   By: Suzy Bouchard M.D.   On: 08/17/2015 19:48   Dg Hip Unilat  With Pelvis 2-3 Views Right  08/16/2015  CLINICAL DATA:  [place] while walking her dog at 20:00. EXAM: DG HIP (WITH OR WITHOUT PELVIS) 2-3V RIGHT COMPARISON:  None. FINDINGS: There is an intertrochanteric right hip fracture, well aligned. No dislocation. Moderate right hip arthritis. Left hip is moderately arthritic as well.  IMPRESSION: Intertrochanteric right hip fracture. Electronically Signed   By: Andreas Newport M.D.   On: 08/16/2015 23:59    Microbiology: No results found for this or any previous visit (from the past 240 hour(s)).   Labs: Basic Metabolic Panel:  Recent Labs Lab 09/06/15 1746 09/07/15 0458 09/08/15 0516 09/09/15 0446  NA 139 144 142 139  K 3.3* 4.0 2.8* 3.0*  CL 99* 111 111 109  CO2 29 25 17* 22  GLUCOSE 140* 106* 58* 91  BUN 22* '17 18 14  '$ CREATININE 0.69 0.73 0.69 0.58  CALCIUM 9.2 8.4* 8.4* 8.5*  MG  --   --   --  1.9   Liver Function Tests:  Recent Labs Lab 09/06/15 1746  AST 26  ALT 20  ALKPHOS 105  BILITOT 1.0  PROT 6.9  ALBUMIN 3.9    Recent Labs Lab 09/06/15 1746  LIPASE 20   No results for input(s): AMMONIA in the last 168 hours. CBC:  Recent Labs Lab 09/06/15 1746 09/07/15 0458 09/08/15 0516  WBC 12.8* 7.7 8.9  HGB 12.1 10.0* 9.8*  HCT 37.3 31.7* 31.5*  MCV 94.2 95.8 98.4  PLT 484* 391 333   Cardiac Enzymes: No results for input(s): CKTOTAL, CKMB, CKMBINDEX, TROPONINI in the last 168 hours. BNP: BNP (last 3 results)  Recent Labs  05/31/15 0553  BNP 255.9*    ProBNP (last 3 results) No results for input(s): PROBNP in the last 8760 hours.  CBG: No results for input(s): GLUCAP in the last 168 hours.     SignedDebbe Odea, MD [facility] 09/09/2015, 10:58 AM

## 2015-09-12 ENCOUNTER — Non-Acute Institutional Stay (SKILLED_NURSING_FACILITY): Payer: Medicare Other | Admitting: Internal Medicine

## 2015-09-12 DIAGNOSIS — K5669 Other intestinal obstruction: Secondary | ICD-10-CM | POA: Diagnosis not present

## 2015-09-12 DIAGNOSIS — S72001E Fracture of unspecified part of neck of right femur, subsequent encounter for open fracture type I or II with routine healing: Secondary | ICD-10-CM | POA: Diagnosis not present

## 2015-09-12 DIAGNOSIS — J9620 Acute and chronic respiratory failure, unspecified whether with hypoxia or hypercapnia: Secondary | ICD-10-CM

## 2015-09-12 DIAGNOSIS — M545 Low back pain: Secondary | ICD-10-CM | POA: Diagnosis not present

## 2015-09-12 DIAGNOSIS — K56609 Unspecified intestinal obstruction, unspecified as to partial versus complete obstruction: Secondary | ICD-10-CM

## 2015-09-14 ENCOUNTER — Encounter: Payer: Self-pay | Admitting: Internal Medicine

## 2015-09-14 LAB — BASIC METABOLIC PANEL
BUN: 10 mg/dL (ref 4–21)
Creatinine: 0.6 mg/dL (ref <=1.1)
Glucose: 105 mg/dL
Potassium: 3.6 mmol/L (ref 3.4–5.3)
Sodium: 142 mmol/L (ref 137–147)

## 2015-09-14 LAB — CBC AND DIFFERENTIAL
HCT: 36 % (ref 36–46)
HEMOGLOBIN: 11.6 g/dL — AB (ref 12.0–16.0)
PLATELETS: 316 10*3/uL (ref 150–399)
WBC: 9.2 10*3/mL

## 2015-09-14 NOTE — Progress Notes (Signed)
Location:  [facility] Room Number: 914/N Place of Service:  SNF [phone]) Provider:  Granville Lewis  Smothers, Andree Elk, NP  Patient Care Team: Junie Panning, NP as PCP - General (Nurse Practitioner) [facility] Medicine)  Extended Emergency Contact Information Primary Emergency Contact: [name] [address] [address] of [place] Phone: [phone] Work Phone: [phone] Mobile Phone: [phone] Relation: Daughter Secondary Emergency Contact: [name] [place] of [place] Phone: [phone] Relation: Neighbor  Code Status: DNR  Goals of care: Advanced Directive information Advanced Directives 09/12/2015  Does patient have an advance directive? Yes  Type of Advance Directive Out of facility DNR (pink MOST or yellow form)  Does patient want to make changes to advanced directive? No - Patient declined  Copy of advanced directive(s) in chart? Yes     Chief complaint-acute visit follow-up small bowel obstruction requiring hospitalization  HPI:  Pt is a 82 y.o. female seen today for an acute visit for .Follow-up small bowel obstruction and required hospitalization     Patient is a pleasant 82 year old female with a history COPD with suspected lung cancer oxygen dependent also with a history of anemia coronary artery disease peripheral vascular disease hypertension and carotid artery occlusion.  She had nausea vomiting and abdominal pain in the facility prompted ER evaluation.  She was found to have a small bowel obstruction.  Conservative management was decided upon-NG tube was attempted for 2 attempts but unsuccessful and patient eventually refused it.  She has had no further vomiting had a BM was able to drink and had multiple BMs before discharge  She also had hypokalemia in the hospital that is being supplemented.    [name]  and is working with therapy.  Also has significant COPD with suspected lung carcinoma she will need pulmonology follow-up upon discharge-currently she does not complain of any increased cough or shortness of breath beyond baseline.  [name] she is on Percocet as well as a muscle relaxer but says these are not totally effective.        Past Medical History  Diagnosis Date  . COPD (chronic obstructive pulmonary disease) ([place])   . Carotid artery occlusion   . Arthritis   . Anxiety     Afraid , living alone  . Myocardial infarction ([place]) 1987  . Anemia   . Bilateral leg pain     for years  . Femoral bruit   . Bronchitis, chronic ([place])   . Fall April 2014    Pt. has fallen twife in the last 2 months.   . Personal history of other diseases of digestive system     Upper GI bleed  . Generalized anxiety disorder   . Nonspecific abnormal electrocardiogram (ECG) (EKG)     Abnormal Results EKG  . Memory loss     Worsening  . Intestinal infection due to Clostridium difficile   . Colitis due to Clostridium difficile   . Hypertension   . CAD (coronary artery disease)   . Thyroid disease     Hypo-Thyroidism   Past Surgical History  Procedure Laterality Date  . Carotid endarterectomy  12/30/2008  . Abdominal aortagram N/A 09/19/2012    Procedure: ABDOMINAL Maxcine Ham;  Surgeon: Elam Dutch, MD;  Location: [facility] CATH LAB;  Service: Cardiovascular;  Laterality: N/A;  .  Lower extremity angiogram Bilateral 09/19/2012    Procedure: LOWER EXTREMITY ANGIOGRAM;  Surgeon: Elam Dutch, MD;  Location: [facility] CATH LAB;  Service: Cardiovascular;  Laterality: Bilateral;  . Femur im nail Right 08/17/2015    Procedure: RIGHT HIP INTRAMEDULLARY (IM) NAIL FEMORAL;  Surgeon: Mcarthur Rossetti, MD;  Location: [facility] ORS;  Service: Orthopedics;  Laterality: Right;  . Cesarean section      x 3    Allergies  Allergen Reactions  . Aspirin      GI  Bleed  . Penicillins     Numbness around mouth  Has patient had a PCN reaction causing immediate rash, facial/tongue/throat swelling, SOB or lightheadedness with hypotension: Yes Has patient had a PCN reaction causing severe rash involving mucus membranes or skin necrosis: No Has patient had a PCN reaction that required hospitalization No Has patient had a PCN reaction occurring within the last 10 years: No If all of the above answers are "NO", then may proceed with Cephalosporin use.    . Biaxin [Clarithromycin] Nausea Only  . Sulfonamide Derivatives Nausea And Vomiting  . Buprenorphine Nausea And Vomiting  . Codeine Nausea And Vomiting  . Hydrocodone Nausea And Vomiting  . Morphine And Related Nausea Only    Nausea   . Sulfa Antibiotics Nausea And Vomiting  . Xanax [Alprazolam]     unknown      Medication List       This list is accurate as of: 09/12/15 11:59 PM.  Always use your most recent med list.               busPIRone 15 MG tablet  Commonly known as:  BUSPAR  Take 15 mg by mouth 2 (two) times daily.     citalopram 20 MG tablet  Commonly known as:  CELEXA  Take 20 mg by mouth daily.     docusate sodium 100 MG capsule  Commonly known as:  COLACE  Take 1 capsule (100 mg total) by mouth 2 (two) times daily.     feeding supplement (ENSURE ENLIVE) Liqd  Take 237 mLs by mouth 3 (three) times daily between meals.     felodipine 5 MG 24 hr tablet  Commonly known as:  PLENDIL  Take 10 mg by mouth daily.     hydrOXYzine 25 MG tablet  Commonly known as:  ATARAX/VISTARIL  Take 25 mg by mouth daily as needed (sleep).     INCRUSE ELLIPTA 62.5 MCG/INH Aepb  Generic drug:  umeclidinium bromide  Inhale 1 puff into the lungs daily. Rinse mouth out with water after use. (COPD )     ipratropium-albuterol 0.5-2.5 (3) MG/3ML Soln  Commonly known as:  DUONEB  Take 3 mLs by nebulization every 6 (six) hours as needed (shortness of breath).     levothyroxine 88 MCG  tablet  Commonly known as:  SYNTHROID, LEVOTHROID  Take 88 mcg by mouth daily.     methocarbamol 500 MG tablet  Commonly known as:  ROBAXIN  Take 1,000 mg by mouth every 6 (six) hours.     mometasone-formoterol 100-5 MCG/ACT Aero  Commonly known as:  DULERA  Inhale 2 puffs into the lungs 2 (two) times daily.     nitroGLYCERIN 0.4 MG SL tablet  Commonly known as:  NITROSTAT  Place 0.4 mg under the tongue every 5 (five) minutes as needed for chest pain.     omeprazole 20 MG capsule  Commonly known as:  PRILOSEC  Take 1 capsule (20 mg total)  by mouth daily.     oxyCODONE-acetaminophen 5-325 MG tablet  Commonly known as:  PERCOCET/ROXICET  Take 1 tablet by mouth every 8 (eight) hours as needed for severe pain.     potassium chloride 10 MEQ CR capsule  Commonly known as:  MICRO-K  Take 10 mEq by mouth daily.     tiotropium 18 MCG inhalation capsule  Commonly known as:  SPIRIVA  Place 1 capsule (18 mcg total) into inhaler and inhale daily.     zolpidem 5 MG tablet  Commonly known as:  AMBIEN  Take 1 tablet (5 mg total) by mouth at bedtime as needed for sleep.        Review of Systems   In general no complaints of fever chills.  Skin does not complain of rashes or itching.  Head ears eyes nose mouth and throat no complaints of sore throat or visual changes.  Respiratory has a history of COPD suspected lung carcinoma does not complaining of acute shortness of breath however or increased cough.  Cardiac does not complaining of chest pain appears to have some mild right foot edema apparently this comes and goes according to nursing possibly dependent related.  GI does not complain of nausea vomiting diarrhea constipation or abdominal discomfort.  GU is not complaining of dysuria.  Muscle skeletal does complain of back pain during therapy otherwise does not complain of discomfort or joint pain.  Neurologic is not complaining dizziness headache or syncopal-type  feelings.  Psych currently not complaining of anxiety or acute depressive symptoms does have a history of anxiety and depression however  Immunization History  Administered Date(s) Administered  . Influenza Whole 01/07/2010   Pertinent  Health Maintenance Due  Topic Date Due  . DEXA SCAN  08/28/2016 (Originally 04/30/1997)  . PNA vac Low Risk Adult (1 of 2 - PCV13) 08/28/2016 (Originally 04/30/1997)  . INFLUENZA VACCINE  11/08/2015   No flowsheet data found. Functional Status Survey:    Filed Vitals:   09/14/15 0916  BP: 127/78  Pulse: 88  Temp: 98.2 F (36.8 C)  TempSrc: Oral  Resp: 18    Physical Exam   In general this is a pleasant frail elderly female in no distress.  Her skin is warm and dry. Eyes pupils appear reactive light sclera and conjunctiva are clear except for movements intact visual acuity appears grossly intact.  Oropharynx clear mucous membranes moist.  Chest is clear to auscultation with somewhat shallow air entry no labored breathing.  Heart is regular rate and rhythm without murmur gallop or rub she does have some mild right foot edema pedal pulses is somewhat reduced.  Abdomen soft nontender positive bowel sounds.--She has a well-healed surgical scar  Muscle skeletal does move all extremities 4 she does have general frailty.  She does have some tenderness to palpation of her right lower back I do not note any deformities.  Neurologic is grossly intact her speech is clear.  Psych she is alert and oriented pleasant and appropriate  Labs reviewed:  Recent Labs  09/07/15 0458 09/08/15 0516 09/09/15 0446  NA 144 142 139  K 4.0 2.8* 3.0*  CL 111 111 109  CO2 25 17* 22  GLUCOSE 106* 58* 91  BUN '17 18 14  '$ CREATININE 0.73 0.69 0.58  CALCIUM 8.4* 8.4* 8.5*  MG  --   --  1.9    Recent Labs  05/29/15 1735 09/06/15 1746  AST 21 26  ALT 13* 20  ALKPHOS 59 105  BILITOT 0.4 1.0  PROT 6.3* 6.9  ALBUMIN 4.0 3.9    Recent Labs   05/29/15 1735  08/17/15 0109  09/06/15 1746 09/07/15 0458 09/08/15 0516  WBC 11.2*  < > 15.1*  < > 12.8* 7.7 8.9  NEUTROABS 8.3*  --  14.2*  --   --   --   --   HGB 9.0*  < > 11.2*  < > 12.1 10.0* 9.8*  HCT 26.3*  < > 34.1*  < > 37.3 31.7* 31.5*  MCV 85.4  < > 91.4  < > 94.2 95.8 98.4  PLT 259  < > 308  < > 484* 391 333  < > = values in this interval not displayed. Lab Results  Component Value Date   TSH 0.781 11/02/2013   Lab Results  Component Value Date   HGBA1C 5.6 09/17/2013   Lab Results  Component Value Date   CHOL 219* 09/17/2013   HDL 74 09/17/2013   LDLCALC 122* 09/17/2013   TRIG 117 09/17/2013   CHOLHDL 3.0 09/17/2013    Significant Diagnostic Results in last 30 days:  Dg Chest 1 View  08/19/2015  CLINICAL DATA:  82 year old female with shortness of breath. Right hip fracture. EXAM: CHEST 1 VIEW COMPARISON:  Chest x-ray 08/17/2015. FINDINGS: Mild emphysematous changes are noted throughout the lungs bilaterally. No acute consolidative airspace disease. No pleural effusions. No evidence of pulmonary edema. Heart size is normal. New fullness in the right hilar region. Atherosclerosis in the thoracic aorta. IMPRESSION: 1. New fullness in the right hilar region. This is favored to be vascular, and could represent an acutely enlarged pulmonary artery (e.g., "Fleischner sign" associated with pulmonary embolism). Given the patient's history, clinical correlation for signs and symptoms of pulmonary embolism is suggested, with consideration for further evaluation with PE protocol CT scan if clinically appropriate. 2. Atherosclerosis. These results will be called to the ordering clinician or representative by the Radiologist Assistant, and communication documented in the PACS or zVision Dashboard. Electronically Signed   By: Vinnie Langton M.D.   On: 08/19/2015 19:57   Dg Chest 2 View  08/17/2015  CLINICAL DATA:  Dyspnea.  Hip fracture. EXAM: CHEST  2 VIEW COMPARISON:  None.  FINDINGS: A single supine AP view of the chest is negative for pneumothorax or large effusion. Mediastinal contours are normal. The lungs are clear. The pulmonary vasculature is normal. IMPRESSION: No acute cardiopulmonary findings. Electronically Signed   By: Andreas Newport M.D.   On: 08/17/2015 01:17   Ct Head Wo Contrast  08/17/2015  CLINICAL DATA:  [place] 3 hours ago. EXAM: CT HEAD WITHOUT CONTRAST TECHNIQUE: Contiguous axial images were obtained from the base of the skull through the vertex without intravenous contrast. COMPARISON:  11/03/2013 FINDINGS: There is no intracranial hemorrhage, mass or evidence of acute infarction. There is moderate generalized atrophy. There is moderate chronic microvascular ischemic change. There is remote lacunar infarction in the right caudate and right putamen. There is no significant extra-axial fluid collection. No acute intracranial findings are evident. The calvarium and skullbase are intact. The visible paranasal sinuses and orbits are unremarkable. IMPRESSION: No acute intracranial findings. There is moderate generalized atrophy, remote lacunar infarctions, and chronic appearing white matter hypodensities which likely represent small vessel ischemic disease. Electronically Signed   By: Andreas Newport M.D.   On: 08/17/2015 00:52   Ct Angio Chest Pe W/cm &/or Wo Cm  08/21/2015  CLINICAL DATA:  82 year old female with history of shortness of breath and  clinical concern for potential pulmonary embolism. History of recent fall with right intertrochanteric hip fracture status post ORIF on 08/17/2015. EXAM: CT ANGIOGRAPHY CHEST WITH CONTRAST TECHNIQUE: Multidetector CT imaging of the chest was performed using the standard protocol during bolus administration of intravenous contrast. Multiplanar CT image reconstructions and MIPs were obtained to evaluate the vascular anatomy. CONTRAST:  100 mL of Isovue 370. COMPARISON:  Chest CT 11/10/2008. FINDINGS: Mediastinum/Lymph  Nodes: Study is slightly limited by patient respiratory motion. Despite this limitation, there is no central, lobar or segmental sized filling defect within the pulmonary arteries to suggest clinically relevant pulmonary embolism. Smaller distal subsegmental sized pulmonary embolism cannot be entirely excluded. Heart size is borderline enlarged. Small amount of pericardial fluid and/or thickening, unlikely to be of any hemodynamic significance at this time. No associated pericardial calcification. There is atherosclerosis of the thoracic aorta, the great vessels of the mediastinum and the coronary arteries, including calcified atherosclerotic plaque in the left main, left anterior descending and right coronary arteries. Mild calcifications of the aortic and mitral valve sparing No pathologically enlarged mediastinal or hilar lymph nodes. Esophagus is unremarkable in appearance. No axillary lymphadenopathy. Lungs/Pleura: Small bilateral pleural effusions lying dependently (right greater than left) with some associated passive subsegmental atelectasis in the lower lobes of the lungs bilaterally. Importantly, the previously noted nodular areas of ground-glass attenuation and architectural distortion in the upper lobes the lungs bilaterally have increased in size compared to prior study 11/10/2008. The lesion in the left apex currently measures 2.9 x 1.5 cm (image 19 of series 7) previously 2.1 x 0.8 cm on 11/10/2008, and appears as a more dense area of ground-glass attenuation, septal thickening and demonstrates some internal bronchiolectasis, concerning potential adenocarcinoma. The lesion in the right apex which was previously a 1.3 cm pure ground-glass attenuation nodule currently measures 1.8 x 2.6 cm (image 25 of series 7) and has a central 8 mm solid component (image 17 of series 4), also concerning for potential adenocarcinoma. As well, there is a new peripheral ground-glass attenuation lesion measuring 2.8 x  1.5 cm in the right upper lobe (image 50 of series 7) which also has a central 12 mm solid component (image 34 of series 4), concerning for additional focus of disease. A few other scattered ill-defined areas of ground-glass attenuation are also noted. Mild diffuse bronchial wall thickening. Partial collapse of lower lobe bronchi during expiration, indicative of bronchomalacia. Upper Abdomen: Calcified granuloma in the periphery of segment 8 of the liver. Atherosclerosis. Musculoskeletal/Soft Tissues: There are no aggressive appearing lytic or blastic lesions noted in the visualized portions of the skeleton. Review of the MIP images confirms the above findings. IMPRESSION: 1. Despite the mild limitations of today's examination there is no evidence to suggest clinically relevant central, lobar or segmental sized pulmonary embolism. 2. However, there are multiple pulmonary lesions bilaterally which have the appearance concerning for probable multifocal primary bronchogenic adenocarcinoma. While one of these lesions in the right upper lobe is new, the other 2 lesions in the apices of the upper lobes of the lungs bilaterally have clearly enlarged compared to remote prior study 11/10/2008. Further evaluation with nonemergent PET-CT is recommended in the near future for diagnostic and staging purposes. 3. Small bilateral pleural effusions (right greater than left) lying dependently with minimal passive subsegmental atelectasis in the lower lobes of the lungs bilaterally. 4. Evidence of bronchomalacia, as above. 5. Small amount of pericardial fluid and/or thickening, unlikely to be of hemodynamic significance at this time. 6. Atherosclerosis,  including left main and 2 vessel coronary artery disease. These results will be called to the ordering clinician or representative by the Radiologist Assistant, and communication documented in the PACS or zVision Dashboard. Electronically Signed   By: Vinnie Langton M.D.   On:  08/21/2015 11:28   Ct Abdomen Pelvis W Contrast  09/06/2015  CLINICAL DATA:  Generalized abdominal pain with nausea and vomiting EXAM: CT ABDOMEN AND PELVIS WITH CONTRAST TECHNIQUE: Multidetector CT imaging of the abdomen and pelvis was performed using the standard protocol following bolus administration of intravenous contrast. CONTRAST:  133m ISOVUE-300 IOPAMIDOL (ISOVUE-300) INJECTION 61% COMPARISON:  Aug 22, 2015 FINDINGS: Lower chest: There is mild bibasilar atelectatic change. There is the pericardial effusion. There is atherosclerotic calcification in the distal aorta. Hepatobiliary: No focal liver lesions are identified. There is focal fatty infiltration near the fissure for the ligamentum teres. Small gallstones are noted within the gallbladder. The gallbladder wall is not thickened. Mild intrahepatic and extrahepatic biliary duct dilatation is stable compared to recent prior study. No biliary duct mass or calculus is evident. Pancreas: There is no demonstrable pancreatic mass or inflammatory focus. Pancreas is rather atrophic, stable. Spleen: No splenic lesions are evident. Adrenals/Urinary Tract: Adrenals appear unremarkable bilaterally. Areas of scarring in the left kidney are stable. Multiple foci of arterial vascular calcification is noted in each kidney. There is a cyst in the posterior lower pole right kidney measuring 7 x 7 mm. There is a 1 x 1 cm cyst in the lower pole left kidney. There is no hydronephrosis on either side. No renal or ureteral calculi are identified. Urinary bladder is midline with wall thickness within normal limits. Stomach/Bowel: There is dilatation of the stomach and proximal small bowel. There is a transition zone at the junction of the mid and distal portions of the jejunum consistent with a degree of small bowel obstruction. No free air or portal venous air. There is no appreciable bowel wall or mesenteric thickening. There are multiple sigmoid diverticula without  diverticulitis. Vascular/Lymphatic: There is atherosclerotic calcification in the aorta and iliac arteries. Peripheral thrombus is noted in the distal aorta. There is no frank abdominal aortic aneurysm. Major mesenteric vessels appear patent. No adenopathy is apparent in the abdomen or pelvis. Reproductive: Uterus is anteverted. There is no pelvic mass. There is free fluid in the cul-de-sac region. Other: There is no abscess apparent in the abdomen or pelvis. There is no periappendiceal inflammation. Musculoskeletal: There is upper lumbar levoscoliosis. There is extensive arthropathy in the lumbar spine. There is vacuum phenomenon at L4-5 and L5-S1. There is evidence of a prior comminuted right proximal femur fracture with screw and plate fixation in this area. No acute fracture is evident. Bones are osteoporotic. There are no evident blastic or lytic bone lesions. There is no intramuscular or abdominal wall lesion. IMPRESSION: Small bowel obstruction with transition zone at the level of the mid to distal jejunum. No free air. Multiple sigmoid diverticula without diverticulitis. Mild ascites in the dependent portion of the pelvis.  No abscess. Small gallstones within the gallbladder. Stable biliary duct dilatation without biliary duct mass or calculus seen. Atrophic pancreas without focal pancreatic lesion. Extensive atherosclerotic calcification. Prior right proximal femur fracture with postoperative change. Bones osteoporotic. Extensive arthropathy in the lumbar region, most severe at L4-5 and L5-S1. These results were called by telephone at the time of interpretation on 09/06/2015 at 7:33 pm to [facility] , who verbally acknowledged these results. Electronically Signed   By: WGwyndolyn Saxon  Jasmine December III M.D.   On: 09/06/2015 19:34   Ct Abdomen Pelvis W Contrast  08/22/2015  CLINICAL DATA:  82 year old with chest CT yesterday demonstrating enlarging semi-solid masses in both lungs worrisome for bronchogenic  adenocarcinoma. Staging CT. EXAM: CT ABDOMEN AND PELVIS WITH CONTRAST TECHNIQUE: Multidetector CT imaging of the abdomen and pelvis was performed using the standard protocol following bolus administration of intravenous contrast. CONTRAST:  144m ISOVUE-300 IOPAMIDOL INJECTION 61% IV. Oral contrast was also administered. COMPARISON:  None. FINDINGS: Lower chest: Small bilateral pleural effusions, right greater than left, and associated passive atelectasis in the lower lobes. Very small pericardial effusion. Aortic annular and mitral annular calcification. Hepatobiliary: Liver normal in size and appearance. Small gallstones within an otherwise normal appearing gallbladder. Mild intra and extrahepatic biliary ductal dilation, the common duct measuring up to approximately 12 mm diameter. No visible obstructing mass or stone within the duct. Pancreas: Atrophic, accounting for the mild pancreatic ductal dilation. No parenchymal mass or peripancreatic inflammation. Spleen: Normal in size and appearance. Adrenals/Urinary Tract: Normal appearing adrenal glands. Focal scarring involving the upper pole and mid pole of the left kidney. Subcentimeter cortical cyst involving the mid left kidney. No solid mass involving either kidney. No hydronephrosis. No urinary tract calculi. Atherosclerotic arterial calcification within both kidneys mimic calculi. Stomach/Bowel: Small hiatal hernia. Stomach decompressed and otherwise unremarkable. Normal-appearing small bowel. Diffuse colonic diverticulosis without evidence of acute diverticulitis. Colon otherwise unremarkable. Appendix not clearly visualized. No ascites. Vascular/Lymphatic: Severe aorto-iliofemoral atherosclerosis without aneurysm. No pathologic lymphadenopathy. Reproductive: Uterus atrophic as expected for age. No adnexal masses. Other: Edema involving the subcutaneous tissues of the right lateral abdominal wall and right gluteal region. Musculoskeletal: Thoracolumbar  levoscoliosis. Degenerative disc disease and spondylosis at every visualized lower thoracic and lumbar level with severe facet degenerative changes throughout the lumbar spine and severe multifactorial spinal stenosis at L4-5. IMPRESSION: 1. No evidence of metastatic disease involving the abdomen or pelvis. 2. No acute abnormalities involving the abdomen or pelvis. 3. Mild intra and extrahepatic biliary ductal dilation without visible obstructing stone or mass. 4. Small hiatal hernia. 5. Pancreatic atrophy. 6. Scarring involving the upper pole and mid pole of the left kidney. 7. 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 8. Skeletal findings as above, most significantly severe multifactorial spinal stenosis at the L4-5 level. 9. Small bilateral pleural effusions, right greater than left, with associated passive atelectasis involving the lower lobes. Electronically Signed   By: TEvangeline DakinM.D.   On: 08/22/2015 12:46   Dg Chest Port 1 View  09/06/2015  CLINICAL DATA:  Abdominal pain, weakness, possible surgery for small bowel obstruction EXAM: PORTABLE CHEST 1 VIEW COMPARISON:  CT chest dated 08/21/2015 FINDINGS: Lungs are essentially clear. Recent ground-glass nodular opacities on CT are not radiographically evident. No pleural effusion or pneumothorax. The heart is normal in size. IMPRESSION: No evidence of acute cardiopulmonary disease. Recent ground-glass nodular opacities on CT are not radiographically evident. Electronically Signed   By: SJulian HyM.D.   On: 09/06/2015 21:54   Dg Abd 2 Views  09/08/2015  CLINICAL DATA:  Small bowel obstruction EXAM: ABDOMEN - 2 VIEW COMPARISON:  09/07/15 FINDINGS: Oral contrast is seen throughout the large bowel and into the rectum. There remain some mildly distended loops of small bowel. No free air is identified. There are air-fluid levels seen diffusely. IMPRESSION: Diffuse air-fluid levels could indicate ileus. Although there are a few mildly  distended loops of small bowel, contrast is seen into the rectum. Electronically Signed  By: Skipper Cliche M.D.   On: 09/08/2015 09:55   Dg Abd Portable 1v-small Bowel Obstruction Protocol-initial, 8 Hr Delay  09/07/2015  CLINICAL DATA:  Small bowel obstruction EXAM: PORTABLE ABDOMEN - 1 VIEW COMPARISON:  Single view of the abdomen from earlier today FINDINGS: The patient i had s been given oral contrast in the interval. Contrast is seen within the colon after an 8 hour delay. Diverticuli are identified. Air-filled small bowel loops continue to improve with no convincing evidence of persistent obstruction. No other acute abnormalities. IMPRESSION: Contrast seen in the colon. No convincing evidence of remaining small bowel obstruction. Electronically Signed   By: Dorise Bullion III M.D   On: 09/07/2015 18:33   Dg Abd Portable 1v  09/07/2015  CLINICAL DATA:  Small bowel obstruction EXAM: PORTABLE ABDOMEN - 1 VIEW COMPARISON:  Yesterday FINDINGS: Dilated small bowel loops and nondilated gas-filled large bowel are scattered across the abdomen. Gas-filled small bowel loops have become less prominent. Iodinated contrast fills the bladder. No obvious free intraperitoneal gas. Osteopenia. Degenerative changes in the hip joints and lumbar spine with levoscoliosis. IMPRESSION: Improved partial small bowel obstruction pattern. Electronically Signed   By: Marybelle Killings M.D.   On: 09/07/2015 07:27   Dg C-arm 1-60 Min-no Report  08/17/2015  CLINICAL DATA: right hip IM nail C-ARM 1-60 MINUTES Fluoroscopy was utilized by the requesting physician.  No radiographic interpretation.   Dg Hip Operative Unilat With Pelvis Right  08/17/2015  CLINICAL DATA:  RIGHT intra medullary nail fixation of intertrochanteric fracture EXAM: OPERATIVE RIGHT HIP (WITH PELVIS IF PERFORMED)  VIEWS TECHNIQUE: Fluoroscopic spot image(s) were submitted for interpretation post-operatively. COMPARISON:  08/16/2015 FINDINGS: Intra medullary nail  fixation of intertrochanteric fracture. Two compression screws noted. Distal nail normal. IMPRESSION: No complication following fine IM nail fixation of RIGHT intertrochanteric fracture. Electronically Signed   By: Suzy Bouchard M.D.   On: 08/17/2015 19:48   Dg Hip Unilat  With Pelvis 2-3 Views Right  08/16/2015  CLINICAL DATA:  [place] while walking her dog at 20:00. EXAM: DG HIP (WITH OR WITHOUT PELVIS) 2-3V RIGHT COMPARISON:  None. FINDINGS: There is an intertrochanteric right hip fracture, well aligned. No dislocation. Moderate right hip arthritis. Left hip is moderately arthritic as well. IMPRESSION: Intertrochanteric right hip fracture. Electronically Signed   By: Andreas Newport M.D.   On: 08/16/2015 23:59    Assessment/Plan #1 history small bowel obstruction again this appears to be resolved she is having regular bowel movements does not complain of abdominal discomfort-at this point continue to monitor symptomatically.  #2 hypokalemia this is been replaced updated metabolic panel has been ordered for later this week.  #3 history of COPD with suspected lung carcinoma-clinically she appears to be stable on discharge she will need pulmonology follow-up.  She does continue onIncruse Ellipta-as well as duo nebs every 6 hours when necessary she is also on Spiriva daily.  #4-history of back pain-I note she is on Robaxin thousand milligrams every 6 hours as well as Percocet 5-3 25 mg every 8 hours when necessary-would be hasn't increased these-I have spoken to her about a topical treatment and will give a trial of Biofreeze when necessary to see if this will help.  #5-history of peripherovascular disease she continues on Plendil.  #6-coronary artery disease this appears stable at this point she does have Nitrostat as needed has not complained of chest pain. She is not on anticoagulation appear secondary to history of GI bleed.  #7-history of anxiety this  appears stable she is on BuSpar we did  recently increase this.  #8 history of depression this appears stable on Celexa 20 mg daily.  #9 history hypothyroidism this is being supplemented she is on Synthroid.   #10 history of right foot edema there is suspicion this is dependent related but will check a venous Doppler to rule out any DVT.  #11 history of right hip fracture this was surgically repair this appears to be stable she is working with rehabilitation pain issues as noted above-currently she appears stable in this regards-she did complete a short course of Eliquist for DVT prophylaxis  [id number]-of note greater than 40 minutes spent assessing patient-reviewing her chart-her labs-discussing her status at bedside with patient-as well as with nursing-and coordinating and formulating a plan of care for numerous diagnoses-of note greater than 50% of time spent coordinating plan of care         Oralia Manis, [place]

## 2015-09-16 ENCOUNTER — Encounter: Payer: Self-pay | Admitting: Internal Medicine

## 2015-09-16 ENCOUNTER — Non-Acute Institutional Stay (SKILLED_NURSING_FACILITY): Payer: Medicare Other | Admitting: Internal Medicine

## 2015-09-16 DIAGNOSIS — S72001D Fracture of unspecified part of neck of right femur, subsequent encounter for closed fracture with routine healing: Secondary | ICD-10-CM

## 2015-09-16 DIAGNOSIS — J42 Unspecified chronic bronchitis: Secondary | ICD-10-CM | POA: Diagnosis not present

## 2015-09-16 DIAGNOSIS — E876 Hypokalemia: Secondary | ICD-10-CM | POA: Diagnosis not present

## 2015-09-16 DIAGNOSIS — K56609 Unspecified intestinal obstruction, unspecified as to partial versus complete obstruction: Secondary | ICD-10-CM

## 2015-09-16 DIAGNOSIS — K5669 Other intestinal obstruction: Secondary | ICD-10-CM

## 2015-09-16 NOTE — Progress Notes (Deleted)
Location:  [facility] Room Number: 160/F Place of Service:  SNF [phone]) Provider:  Granville Lewis  Smothers, Andree Elk, NP  Patient Care Team: Junie Panning, NP as PCP - General (Nurse Practitioner) [place] Staff [facility] Medicine)  Extended Emergency Contact Information Primary Emergency Contact: [name] [address] [address] of [place] Phone: [phone] Work Phone: [phone] Mobile Phone: [phone] Relation: Daughter Secondary Emergency Contact: [name] [place] of [place] Phone: [phone] Relation: Neighbor  Code Status:  DNR Goals of care: Advanced Directive information Advanced Directives 09/16/2015  Does patient have an advance directive? Yes  Type of Advance Directive Out of facility DNR (pink MOST or yellow form)  Does patient want to make changes to advanced directive? No - Patient declined  Copy of advanced directive(s) in chart? Yes     Chief Complaint  Patient presents with  . Discharge Note    HPI:  Pt is a 82 y.o. female seen today for an acute visit for    Past Medical History  Diagnosis Date  . COPD (chronic obstructive pulmonary disease) ([place])   . Carotid artery occlusion   . Arthritis   . Anxiety     Afraid , living alone  . Myocardial infarction ([place]) 1987  . Anemia   . Bilateral leg pain     for years  . Femoral bruit   . Bronchitis, chronic ([place])   . Fall April 2014    Pt. has fallen twife in the last 2 months.   . Personal history of other diseases of digestive system     Upper GI bleed  . Generalized anxiety disorder   . Nonspecific abnormal electrocardiogram (ECG) (EKG)     Abnormal Results EKG  . Memory loss     Worsening  . Intestinal infection due to Clostridium difficile   . Colitis due to Clostridium difficile   . Hypertension   . CAD (coronary artery disease)   . Thyroid disease     Hypo-Thyroidism   Past Surgical History   Procedure Laterality Date  . Carotid endarterectomy  12/30/2008  . Abdominal aortagram N/A 09/19/2012    Procedure: ABDOMINAL Maxcine Ham;  Surgeon: Elam Dutch, MD;  Location: [facility] CATH LAB;  Service: Cardiovascular;  Laterality: N/A;  . Lower extremity angiogram Bilateral 09/19/2012    Procedure: LOWER EXTREMITY ANGIOGRAM;  Surgeon: Elam Dutch, MD;  Location: [facility] CATH LAB;  Service: Cardiovascular;  Laterality: Bilateral;  . Femur im nail Right 08/17/2015    Procedure: RIGHT HIP INTRAMEDULLARY (IM) NAIL FEMORAL;  Surgeon: Mcarthur Rossetti, MD;  Location: [facility] ORS;  Service: Orthopedics;  Laterality: Right;  . Cesarean section      x 3    Allergies  Allergen Reactions  . Aspirin     GI  Bleed  . Penicillins     Numbness around mouth  Has patient had a PCN reaction causing immediate rash, facial/tongue/throat swelling, SOB or lightheadedness with hypotension: Yes Has patient had a PCN reaction causing severe rash involving mucus membranes or skin necrosis: No Has patient had a PCN reaction that required hospitalization No Has patient had a PCN reaction occurring within the last 10 years: No If all of the above answers are "NO", then may proceed with Cephalosporin use.    . Biaxin [Clarithromycin] Nausea Only  . Sulfonamide Derivatives Nausea And Vomiting  . Buprenorphine Nausea And Vomiting  . Codeine  Nausea And Vomiting  . Hydrocodone Nausea And Vomiting  . Morphine And Related Nausea Only    Nausea   . Sulfa Antibiotics Nausea And Vomiting  . Xanax [Alprazolam]     unknown      Medication List       This list is accurate as of: 09/16/15  3:14 PM.  Always use your most recent med list.               busPIRone 15 MG tablet  Commonly known as:  BUSPAR  Take 15 mg by mouth 2 (two) times daily.     citalopram 20 MG tablet  Commonly known as:  CELEXA  Take 20 mg by mouth daily.     docusate sodium 100 MG capsule  Commonly known as:  COLACE  Take 1 capsule  (100 mg total) by mouth 2 (two) times daily.     feeding supplement (ENSURE ENLIVE) Liqd  Take 237 mLs by mouth 3 (three) times daily between meals.     felodipine 5 MG 24 hr tablet  Commonly known as:  PLENDIL  Take 10 mg by mouth daily.     hydrOXYzine 25 MG tablet  Commonly known as:  ATARAX/VISTARIL  Take 25 mg by mouth daily as needed (sleep).     INCRUSE ELLIPTA 62.5 MCG/INH Aepb  Generic drug:  umeclidinium bromide  Inhale 1 puff into the lungs daily. Rinse mouth out with water after use. (COPD )     ipratropium-albuterol 0.5-2.5 (3) MG/3ML Soln  Commonly known as:  DUONEB  Take 3 mLs by nebulization every 6 (six) hours as needed (shortness of breath).     levothyroxine 88 MCG tablet  Commonly known as:  SYNTHROID, LEVOTHROID  Take 88 mcg by mouth daily.     methocarbamol 500 MG tablet  Commonly known as:  ROBAXIN  Take 1,000 mg by mouth every 6 (six) hours.     mometasone-formoterol 100-5 MCG/ACT Aero  Commonly known as:  DULERA  Inhale 2 puffs into the lungs 2 (two) times daily.     nitroGLYCERIN 0.4 MG SL tablet  Commonly known as:  NITROSTAT  Place 0.4 mg under the tongue every 5 (five) minutes as needed for chest pain.     omeprazole 20 MG capsule  Commonly known as:  PRILOSEC  Take 1 capsule (20 mg total) by mouth daily.     oxyCODONE-acetaminophen 5-325 MG tablet  Commonly known as:  PERCOCET/ROXICET  Take 1 tablet by mouth every 8 (eight) hours as needed for severe pain.     potassium chloride 10 MEQ CR capsule  Commonly known as:  MICRO-K  Take 10 mEq by mouth daily.     tiotropium 18 MCG inhalation capsule  Commonly known as:  SPIRIVA  Place 1 capsule (18 mcg total) into inhaler and inhale daily.     zolpidem 5 MG tablet  Commonly known as:  AMBIEN  Take 1 tablet (5 mg total) by mouth at bedtime as needed for sleep.        Review of Systems  Immunization History  Administered Date(s) Administered  . Influenza Whole 01/07/2010    Pertinent  Health Maintenance Due  Topic Date Due  . DEXA SCAN  08/28/2016 (Originally 04/30/1997)  . PNA vac Low Risk Adult (1 of 2 - PCV13) 08/28/2016 (Originally 04/30/1997)  . INFLUENZA VACCINE  11/08/2015   No flowsheet data found. Functional Status Survey:    There were no vitals filed for this visit. There  is no weight on file to calculate BMI. Physical Exam  Labs reviewed:  Recent Labs  09/07/15 0458 09/08/15 0516 09/09/15 0446  NA 144 142 139  K 4.0 2.8* 3.0*  CL 111 111 109  CO2 25 17* 22  GLUCOSE 106* 58* 91  BUN '17 18 14  '$ CREATININE 0.73 0.69 0.58  CALCIUM 8.4* 8.4* 8.5*  MG  --   --  1.9    Recent Labs  05/29/15 1735 09/06/15 1746  AST 21 26  ALT 13* 20  ALKPHOS 59 105  BILITOT 0.4 1.0  PROT 6.3* 6.9  ALBUMIN 4.0 3.9    Recent Labs  05/29/15 1735  08/17/15 0109  09/06/15 1746 09/07/15 0458 09/08/15 0516  WBC 11.2*  < > 15.1*  < > 12.8* 7.7 8.9  NEUTROABS 8.3*  --  14.2*  --   --   --   --   HGB 9.0*  < > 11.2*  < > 12.1 10.0* 9.8*  HCT 26.3*  < > 34.1*  < > 37.3 31.7* 31.5*  MCV 85.4  < > 91.4  < > 94.2 95.8 98.4  PLT 259  < > 308  < > 484* 391 333  < > = values in this interval not displayed. Lab Results  Component Value Date   TSH 0.781 11/02/2013   Lab Results  Component Value Date   HGBA1C 5.6 09/17/2013   Lab Results  Component Value Date   CHOL 219* 09/17/2013   HDL 74 09/17/2013   LDLCALC 122* 09/17/2013   TRIG 117 09/17/2013   CHOLHDL 3.0 09/17/2013    Significant Diagnostic Results in last 30 days:  Dg Chest 1 View  08/19/2015  CLINICAL DATA:  82 year old female with shortness of breath. Right hip fracture. EXAM: CHEST 1 VIEW COMPARISON:  Chest x-ray 08/17/2015. FINDINGS: Mild emphysematous changes are noted throughout the lungs bilaterally. No acute consolidative airspace disease. No pleural effusions. No evidence of pulmonary edema. Heart size is normal. New fullness in the right hilar region. Atherosclerosis in  the thoracic aorta. IMPRESSION: 1. New fullness in the right hilar region. This is favored to be vascular, and could represent an acutely enlarged pulmonary artery (e.g., "Fleischner sign" associated with pulmonary embolism). Given the patient's history, clinical correlation for signs and symptoms of pulmonary embolism is suggested, with consideration for further evaluation with PE protocol CT scan if clinically appropriate. 2. Atherosclerosis. These results will be called to the ordering clinician or representative by the Radiologist Assistant, and communication documented in the PACS or zVision Dashboard. Electronically Signed   By: Vinnie Langton M.D.   On: 08/19/2015 19:57   Ct Angio Chest Pe W/cm &/or Wo Cm  08/21/2015  CLINICAL DATA:  82 year old female with history of shortness of breath and clinical concern for potential pulmonary embolism. History of recent fall with right intertrochanteric hip fracture status post ORIF on 08/17/2015. EXAM: CT ANGIOGRAPHY CHEST WITH CONTRAST TECHNIQUE: Multidetector CT imaging of the chest was performed using the standard protocol during bolus administration of intravenous contrast. Multiplanar CT image reconstructions and MIPs were obtained to evaluate the vascular anatomy. CONTRAST:  100 mL of Isovue 370. COMPARISON:  Chest CT 11/10/2008. FINDINGS: Mediastinum/Lymph Nodes: Study is slightly limited by patient respiratory motion. Despite this limitation, there is no central, lobar or segmental sized filling defect within the pulmonary arteries to suggest clinically relevant pulmonary embolism. Smaller distal subsegmental sized pulmonary embolism cannot be entirely excluded. Heart size is borderline enlarged. Small amount of pericardial  fluid and/or thickening, unlikely to be of any hemodynamic significance at this time. No associated pericardial calcification. There is atherosclerosis of the thoracic aorta, the great vessels of the mediastinum and the coronary  arteries, including calcified atherosclerotic plaque in the left main, left anterior descending and right coronary arteries. Mild calcifications of the aortic and mitral valve sparing No pathologically enlarged mediastinal or hilar lymph nodes. Esophagus is unremarkable in appearance. No axillary lymphadenopathy. Lungs/Pleura: Small bilateral pleural effusions lying dependently (right greater than left) with some associated passive subsegmental atelectasis in the lower lobes of the lungs bilaterally. Importantly, the previously noted nodular areas of ground-glass attenuation and architectural distortion in the upper lobes the lungs bilaterally have increased in size compared to prior study 11/10/2008. The lesion in the left apex currently measures 2.9 x 1.5 cm (image 19 of series 7) previously 2.1 x 0.8 cm on 11/10/2008, and appears as a more dense area of ground-glass attenuation, septal thickening and demonstrates some internal bronchiolectasis, concerning potential adenocarcinoma. The lesion in the right apex which was previously a 1.3 cm pure ground-glass attenuation nodule currently measures 1.8 x 2.6 cm (image 25 of series 7) and has a central 8 mm solid component (image 17 of series 4), also concerning for potential adenocarcinoma. As well, there is a new peripheral ground-glass attenuation lesion measuring 2.8 x 1.5 cm in the right upper lobe (image 50 of series 7) which also has a central 12 mm solid component (image 34 of series 4), concerning for additional focus of disease. A few other scattered ill-defined areas of ground-glass attenuation are also noted. Mild diffuse bronchial wall thickening. Partial collapse of lower lobe bronchi during expiration, indicative of bronchomalacia. Upper Abdomen: Calcified granuloma in the periphery of segment 8 of the liver. Atherosclerosis. Musculoskeletal/Soft Tissues: There are no aggressive appearing lytic or blastic lesions noted in the visualized portions of the  skeleton. Review of the MIP images confirms the above findings. IMPRESSION: 1. Despite the mild limitations of today's examination there is no evidence to suggest clinically relevant central, lobar or segmental sized pulmonary embolism. 2. However, there are multiple pulmonary lesions bilaterally which have the appearance concerning for probable multifocal primary bronchogenic adenocarcinoma. While one of these lesions in the right upper lobe is new, the other 2 lesions in the apices of the upper lobes of the lungs bilaterally have clearly enlarged compared to remote prior study 11/10/2008. Further evaluation with nonemergent PET-CT is recommended in the near future for diagnostic and staging purposes. 3. Small bilateral pleural effusions (right greater than left) lying dependently with minimal passive subsegmental atelectasis in the lower lobes of the lungs bilaterally. 4. Evidence of bronchomalacia, as above. 5. Small amount of pericardial fluid and/or thickening, unlikely to be of hemodynamic significance at this time. 6. Atherosclerosis, including left main and 2 vessel coronary artery disease. These results will be called to the ordering clinician or representative by the Radiologist Assistant, and communication documented in the PACS or zVision Dashboard. Electronically Signed   By: Vinnie Langton M.D.   On: 08/21/2015 11:28   Ct Abdomen Pelvis W Contrast  09/06/2015  CLINICAL DATA:  Generalized abdominal pain with nausea and vomiting EXAM: CT ABDOMEN AND PELVIS WITH CONTRAST TECHNIQUE: Multidetector CT imaging of the abdomen and pelvis was performed using the standard protocol following bolus administration of intravenous contrast. CONTRAST:  154m ISOVUE-300 IOPAMIDOL (ISOVUE-300) INJECTION 61% COMPARISON:  Aug 22, 2015 FINDINGS: Lower chest: There is mild bibasilar atelectatic change. There is the pericardial effusion. There  is atherosclerotic calcification in the distal aorta. Hepatobiliary: No focal  liver lesions are identified. There is focal fatty infiltration near the fissure for the ligamentum teres. Small gallstones are noted within the gallbladder. The gallbladder wall is not thickened. Mild intrahepatic and extrahepatic biliary duct dilatation is stable compared to recent prior study. No biliary duct mass or calculus is evident. Pancreas: There is no demonstrable pancreatic mass or inflammatory focus. Pancreas is rather atrophic, stable. Spleen: No splenic lesions are evident. Adrenals/Urinary Tract: Adrenals appear unremarkable bilaterally. Areas of scarring in the left kidney are stable. Multiple foci of arterial vascular calcification is noted in each kidney. There is a cyst in the posterior lower pole right kidney measuring 7 x 7 mm. There is a 1 x 1 cm cyst in the lower pole left kidney. There is no hydronephrosis on either side. No renal or ureteral calculi are identified. Urinary bladder is midline with wall thickness within normal limits. Stomach/Bowel: There is dilatation of the stomach and proximal small bowel. There is a transition zone at the junction of the mid and distal portions of the jejunum consistent with a degree of small bowel obstruction. No free air or portal venous air. There is no appreciable bowel wall or mesenteric thickening. There are multiple sigmoid diverticula without diverticulitis. Vascular/Lymphatic: There is atherosclerotic calcification in the aorta and iliac arteries. Peripheral thrombus is noted in the distal aorta. There is no frank abdominal aortic aneurysm. Major mesenteric vessels appear patent. No adenopathy is apparent in the abdomen or pelvis. Reproductive: Uterus is anteverted. There is no pelvic mass. There is free fluid in the cul-de-sac region. Other: There is no abscess apparent in the abdomen or pelvis. There is no periappendiceal inflammation. Musculoskeletal: There is upper lumbar levoscoliosis. There is extensive arthropathy in the lumbar spine.  There is vacuum phenomenon at L4-5 and L5-S1. There is evidence of a prior comminuted right proximal femur fracture with screw and plate fixation in this area. No acute fracture is evident. Bones are osteoporotic. There are no evident blastic or lytic bone lesions. There is no intramuscular or abdominal wall lesion. IMPRESSION: Small bowel obstruction with transition zone at the level of the mid to distal jejunum. No free air. Multiple sigmoid diverticula without diverticulitis. Mild ascites in the dependent portion of the pelvis.  No abscess. Small gallstones within the gallbladder. Stable biliary duct dilatation without biliary duct mass or calculus seen. Atrophic pancreas without focal pancreatic lesion. Extensive atherosclerotic calcification. Prior right proximal femur fracture with postoperative change. Bones osteoporotic. Extensive arthropathy in the lumbar region, most severe at L4-5 and L5-S1. These results were called by telephone at the time of interpretation on 09/06/2015 at 7:33 pm to [facility] , who verbally acknowledged these results. Electronically Signed   By: Lowella Grip III M.D.   On: 09/06/2015 19:34   Ct Abdomen Pelvis W Contrast  08/22/2015  CLINICAL DATA:  82 year old with chest CT yesterday demonstrating enlarging semi-solid masses in both lungs worrisome for bronchogenic adenocarcinoma. Staging CT. EXAM: CT ABDOMEN AND PELVIS WITH CONTRAST TECHNIQUE: Multidetector CT imaging of the abdomen and pelvis was performed using the standard protocol following bolus administration of intravenous contrast. CONTRAST:  162m ISOVUE-300 IOPAMIDOL INJECTION 61% IV. Oral contrast was also administered. COMPARISON:  None. FINDINGS: Lower chest: Small bilateral pleural effusions, right greater than left, and associated passive atelectasis in the lower lobes. Very small pericardial effusion. Aortic annular and mitral annular calcification. Hepatobiliary: Liver normal in size and appearance.  Small gallstones  within an otherwise normal appearing gallbladder. Mild intra and extrahepatic biliary ductal dilation, the common duct measuring up to approximately 12 mm diameter. No visible obstructing mass or stone within the duct. Pancreas: Atrophic, accounting for the mild pancreatic ductal dilation. No parenchymal mass or peripancreatic inflammation. Spleen: Normal in size and appearance. Adrenals/Urinary Tract: Normal appearing adrenal glands. Focal scarring involving the upper pole and mid pole of the left kidney. Subcentimeter cortical cyst involving the mid left kidney. No solid mass involving either kidney. No hydronephrosis. No urinary tract calculi. Atherosclerotic arterial calcification within both kidneys mimic calculi. Stomach/Bowel: Small hiatal hernia. Stomach decompressed and otherwise unremarkable. Normal-appearing small bowel. Diffuse colonic diverticulosis without evidence of acute diverticulitis. Colon otherwise unremarkable. Appendix not clearly visualized. No ascites. Vascular/Lymphatic: Severe aorto-iliofemoral atherosclerosis without aneurysm. No pathologic lymphadenopathy. Reproductive: Uterus atrophic as expected for age. No adnexal masses. Other: Edema involving the subcutaneous tissues of the right lateral abdominal wall and right gluteal region. Musculoskeletal: Thoracolumbar levoscoliosis. Degenerative disc disease and spondylosis at every visualized lower thoracic and lumbar level with severe facet degenerative changes throughout the lumbar spine and severe multifactorial spinal stenosis at L4-5. IMPRESSION: 1. No evidence of metastatic disease involving the abdomen or pelvis. 2. No acute abnormalities involving the abdomen or pelvis. 3. Mild intra and extrahepatic biliary ductal dilation without visible obstructing stone or mass. 4. Small hiatal hernia. 5. Pancreatic atrophy. 6. Scarring involving the upper pole and mid pole of the left kidney. 7. Severe aortoiliofemoral  atherosclerosis without aneurysm. 8. Skeletal findings as above, most significantly severe multifactorial spinal stenosis at the L4-5 level. 9. Small bilateral pleural effusions, right greater than left, with associated passive atelectasis involving the lower lobes. Electronically Signed   By: Evangeline Dakin M.D.   On: 08/22/2015 12:46   Dg Chest Port 1 View  09/06/2015  CLINICAL DATA:  Abdominal pain, weakness, possible surgery for small bowel obstruction EXAM: PORTABLE CHEST 1 VIEW COMPARISON:  CT chest dated 08/21/2015 FINDINGS: Lungs are essentially clear. Recent ground-glass nodular opacities on CT are not radiographically evident. No pleural effusion or pneumothorax. The heart is normal in size. IMPRESSION: No evidence of acute cardiopulmonary disease. Recent ground-glass nodular opacities on CT are not radiographically evident. Electronically Signed   By: Julian Hy M.D.   On: 09/06/2015 21:54   Dg Abd 2 Views  09/08/2015  CLINICAL DATA:  Small bowel obstruction EXAM: ABDOMEN - 2 VIEW COMPARISON:  09/07/15 FINDINGS: Oral contrast is seen throughout the large bowel and into the rectum. There remain some mildly distended loops of small bowel. No free air is identified. There are air-fluid levels seen diffusely. IMPRESSION: Diffuse air-fluid levels could indicate ileus. Although there are a few mildly distended loops of small bowel, contrast is seen into the rectum. Electronically Signed   By: Skipper Cliche M.D.   On: 09/08/2015 09:55   Dg Abd Portable 1v-small Bowel Obstruction Protocol-initial, 8 Hr Delay  09/07/2015  CLINICAL DATA:  Small bowel obstruction EXAM: PORTABLE ABDOMEN - 1 VIEW COMPARISON:  Single view of the abdomen from earlier today FINDINGS: The patient i had s been given oral contrast in the interval. Contrast is seen within the colon after an 8 hour delay. Diverticuli are identified. Air-filled small bowel loops continue to improve with no convincing evidence of persistent  obstruction. No other acute abnormalities. IMPRESSION: Contrast seen in the colon. No convincing evidence of remaining small bowel obstruction. Electronically Signed   By: Dorise Bullion III M.D   On: 09/07/2015 18:33  Dg Abd Portable 1v  09/07/2015  CLINICAL DATA:  Small bowel obstruction EXAM: PORTABLE ABDOMEN - 1 VIEW COMPARISON:  Yesterday FINDINGS: Dilated small bowel loops and nondilated gas-filled large bowel are scattered across the abdomen. Gas-filled small bowel loops have become less prominent. Iodinated contrast fills the bladder. No obvious free intraperitoneal gas. Osteopenia. Degenerative changes in the hip joints and lumbar spine with levoscoliosis. IMPRESSION: Improved partial small bowel obstruction pattern. Electronically Signed   By: Marybelle Killings M.D.   On: 09/07/2015 07:27   Dg C-arm 1-60 Min-no Report  08/17/2015  CLINICAL DATA: right hip IM nail C-ARM 1-60 MINUTES Fluoroscopy was utilized by the requesting physician.  No radiographic interpretation.   Dg Hip Operative Unilat With Pelvis Right  08/17/2015  CLINICAL DATA:  RIGHT intra medullary nail fixation of intertrochanteric fracture EXAM: OPERATIVE RIGHT HIP (WITH PELVIS IF PERFORMED)  VIEWS TECHNIQUE: Fluoroscopic spot image(s) were submitted for interpretation post-operatively. COMPARISON:  08/16/2015 FINDINGS: Intra medullary nail fixation of intertrochanteric fracture. Two compression screws noted. Distal nail normal. IMPRESSION: No complication following fine IM nail fixation of RIGHT intertrochanteric fracture. Electronically Signed   By: Suzy Bouchard M.D.   On: 08/17/2015 19:48    Assessment/Plan There are no diagnoses linked to this encounter.      Oralia Manis, [place]

## 2015-09-18 ENCOUNTER — Encounter: Payer: Self-pay | Admitting: Internal Medicine

## 2015-09-18 NOTE — Progress Notes (Signed)
Patient ID: [name], female   DOB: 13-Aug-1930, 82 y.o.   MRN: [id number]  Location:  [place] Room Number: 902/I Place of Service:  SNF [phone]) Provider:  Granville Lewis  Smothers, Andree Elk, NP  Patient Care Team: Junie Panning, NP as PCP - General (Nurse Practitioner) [facility])  Extended Emergency Contact Information Primary Emergency Contact: [name] [address] [address] of [place] Phone: [phone] Work Phone: [phone] Mobile Phone: [phone] Relation: Daughter Secondary Emergency Contact: [name] [place] of [place] Phone: [phone] Relation: Neighbor  Code Status: DNR  Goals of care: Advanced Directive information Advanced Directives 09/16/2015  Does patient have an advance directive? Yes  Type of Advance Directive Out of facility DNR (pink MOST or yellow form)  Does patient want to make changes to advanced directive? No - Patient declined  Copy of advanced directive(s) in chart? Yes     Chief complaint-Discharge note  HPI:  Pt is a 82 y.o. female seen today for a discharge visit     Patient is a pleasant 82 year old female with a history COPD with suspected lung cancer oxygen dependent also with a history of anemia coronary artery disease peripheral vascular disease hypertension and carotid artery occlusion.  She had nausea vomiting and abdominal pain in the facility prompted ER evaluation.  She was found to have a small bowel obstruction.  Conservative management was decided upon-NG tube was attempted for 2 attempts but unsuccessful and patient eventually refused it.  She has had no further vomiting had a BM was able to drink and had multiple BMs before discharge  She also had hypokalemia in the hospital that is being supplemented.    [name] and is working with therapy.  Also has  significant COPD with suspected lung carcinoma she will need pulmonology follow-up upon discharge-currently she does not complain of any increased cough or shortness of breath beyond baseline.  She recently complained of back pain with therapy she is on Percocet as well as a muscle relaxer we did add Biofreeze topically she is not really complaining of back pain today.  Patient is quite adamant she wants to go home she has made significant improvement during her stay here.  She will need PT and OT to evaluate and treat her conditions will need further strengthening and skilled nursing for medical management.  She will also need a nursing aide to help with her ADLs.  Her vital signs appear to be stable she has no complaints today other than wanting to get home apparently this weekend.  .        Past Medical History  Diagnosis Date  . COPD (chronic obstructive pulmonary disease) ([place])   . Carotid artery occlusion   . Arthritis   . Anxiety     Afraid , living alone  . Myocardial infarction ([place]) 1987  . Anemia   . Bilateral leg pain     for years  . Femoral bruit   . Bronchitis, chronic ([place])   . Fall April 2014    Pt. has fallen twife in the last 2 months.   . Personal history of other diseases of digestive system     Upper GI bleed  . Generalized anxiety disorder   . Nonspecific abnormal electrocardiogram (ECG) (EKG)     Abnormal Results EKG  . Memory loss  Worsening  . Intestinal infection due to Clostridium difficile   . Colitis due to Clostridium difficile   . Hypertension   . CAD (coronary artery disease)   . Thyroid disease     Hypo-Thyroidism   Past Surgical History  Procedure Laterality Date  . Carotid endarterectomy  12/30/2008  . Abdominal aortagram N/A 09/19/2012    Procedure: ABDOMINAL Maxcine Ham;  Surgeon: Elam Dutch, MD;  Location: [facility] CATH LAB;  Service: Cardiovascular;  Laterality: N/A;  . Lower extremity angiogram Bilateral 09/19/2012     Procedure: LOWER EXTREMITY ANGIOGRAM;  Surgeon: Elam Dutch, MD;  Location: [facility] CATH LAB;  Service: Cardiovascular;  Laterality: Bilateral;  . Femur im nail Right 08/17/2015    Procedure: RIGHT HIP INTRAMEDULLARY (IM) NAIL FEMORAL;  Surgeon: Mcarthur Rossetti, MD;  Location: [facility] ORS;  Service: Orthopedics;  Laterality: Right;  . Cesarean section      x 3    Allergies  Allergen Reactions  . Aspirin     GI  Bleed  . Penicillins     Numbness around mouth  Has patient had a PCN reaction causing immediate rash, facial/tongue/throat swelling, SOB or lightheadedness with hypotension: Yes Has patient had a PCN reaction causing severe rash involving mucus membranes or skin necrosis: No Has patient had a PCN reaction that required hospitalization No Has patient had a PCN reaction occurring within the last 10 years: No If all of the above answers are "NO", then may proceed with Cephalosporin use.    . Biaxin [Clarithromycin] Nausea Only  . Sulfonamide Derivatives Nausea And Vomiting  . Buprenorphine Nausea And Vomiting  . Codeine Nausea And Vomiting  . Hydrocodone Nausea And Vomiting  . Morphine And Related Nausea Only    Nausea   . Sulfa Antibiotics Nausea And Vomiting  . Xanax [Alprazolam]     unknown      Medication List       This list is accurate as of: 09/16/15 11:59 PM.  Always use your most recent med list.               busPIRone 15 MG tablet  Commonly known as:  BUSPAR  Take 15 mg by mouth 2 (two) times daily.     citalopram 20 MG tablet  Commonly known as:  CELEXA  Take 20 mg by mouth daily.     docusate sodium 100 MG capsule  Commonly known as:  COLACE  Take 1 capsule (100 mg total) by mouth 2 (two) times daily.     feeding supplement (ENSURE ENLIVE) Liqd  Take 237 mLs by mouth 3 (three) times daily between meals.     felodipine 5 MG 24 hr tablet  Commonly known as:  PLENDIL  Take 10 mg by mouth daily.     hydrOXYzine 25 MG tablet  Commonly known  as:  ATARAX/VISTARIL  Take 25 mg by mouth daily as needed (sleep).     INCRUSE ELLIPTA 62.5 MCG/INH Aepb  Generic drug:  umeclidinium bromide  Inhale 1 puff into the lungs daily. Rinse mouth out with water after use. (COPD )     ipratropium-albuterol 0.5-2.5 (3) MG/3ML Soln  Commonly known as:  DUONEB  Take 3 mLs by nebulization every 6 (six) hours as needed (shortness of breath).     levothyroxine 88 MCG tablet  Commonly known as:  SYNTHROID, LEVOTHROID  Take 88 mcg by mouth daily.     methocarbamol 500 MG tablet  Commonly known as:  ROBAXIN  Take 1,000  mg by mouth every 6 (six) hours.     mometasone-formoterol 100-5 MCG/ACT Aero  Commonly known as:  DULERA  Inhale 2 puffs into the lungs 2 (two) times daily.     nitroGLYCERIN 0.4 MG SL tablet  Commonly known as:  NITROSTAT  Place 0.4 mg under the tongue every 5 (five) minutes as needed for chest pain.     omeprazole 20 MG capsule  Commonly known as:  PRILOSEC  Take 1 capsule (20 mg total) by mouth daily.     oxyCODONE-acetaminophen 5-325 MG tablet  Commonly known as:  PERCOCET/ROXICET  Take 1 tablet by mouth every 8 (eight) hours as needed for severe pain.     potassium chloride 10 MEQ CR capsule  Commonly known as:  MICRO-K  Take 10 mEq by mouth daily.     tiotropium 18 MCG inhalation capsule  Commonly known as:  SPIRIVA  Place 1 capsule (18 mcg total) into inhaler and inhale daily.     zolpidem 5 MG tablet  Commonly known as:  AMBIEN  Take 1 tablet (5 mg total) by mouth at bedtime as needed for sleep.        Review of Systems   In general no complaints of fever chills.  Skin does not complain of rashes or itching.  Head ears eyes nose mouth and throat no complaints of sore throat or visual changes.  Respiratory has a history of COPD suspected lung carcinoma does not complaining of acute shortness of breath however or increased cough.  Cardiac does not complaining of chest pain appears To have baseline  right ankle edema  GI does not complain of nausea vomiting diarrhea constipation or abdominal discomfort.  GU is not complaining of dysuria.  Muscle skeletal does complain of back pain today.  Neurologic is not complaining dizziness headache or syncopal-type feelings.  Psych currently not complaining of anxiety or acute depressive symptoms does have a history of anxiety and depression however  Immunization History  Administered Date(s) Administered  . Influenza Whole 01/07/2010   Pertinent  Health Maintenance Due  Topic Date Due  . DEXA SCAN  08/28/2016 (Originally 04/30/1997)  . PNA vac Low Risk Adult (1 of 2 - PCV13) 08/28/2016 (Originally 04/30/1997)  . INFLUENZA VACCINE  11/08/2015   No flowsheet data found. Functional Status Survey:    Filed Vitals:   09/18/15 1536  BP: 138/68  Pulse: 99  Temp: 98.4 F (36.9 C)  Resp: 20    Physical Exam   In general this is a pleasant frail elderly female in no distress.  Her skin is warm and dry. Eyes pupils appear reactive light sclera and conjunctiva are clear except for movements intact visual acuity appears grossly intact.  Oropharynx clear mucous membranes moist.  Chest is clear to auscultation with somewhat shallow air entry no labored breathing.  Heart is regular rate and rhythm without murmur gallop or rub she does have some mild right foot edema pedal pulses is somewhat reduced.  Abdomen soft nontender positive bowel sounds.--She has a well-healed surgical scar  Muscle skeletal does move all extremities 4 she does have general frailty.  She does have some tenderness to palpation of her right lower back I do not note any deformities.  Neurologic is grossly intact her speech is clear.  Psych she is alert and oriented pleasant and appropriate  Labs reviewed:  Recent Labs  09/07/15 0458 09/08/15 0516 09/09/15 0446 09/14/15  NA 144 142 139 142  K 4.0 2.8* 3.0* 3.6  CL 111 111 109  --   CO2 25 17* 22  --     GLUCOSE 106* 58* 91  --   BUN '17 18 14 10  '$ CREATININE 0.73 0.69 0.58 0.6  CALCIUM 8.4* 8.4* 8.5*  --   MG  --   --  1.9  --     Recent Labs  05/29/15 1735 09/06/15 1746  AST 21 26  ALT 13* 20  ALKPHOS 59 105  BILITOT 0.4 1.0  PROT 6.3* 6.9  ALBUMIN 4.0 3.9    Recent Labs  05/29/15 1735  08/17/15 0109  09/06/15 1746 09/07/15 0458 09/08/15 0516 09/14/15  WBC 11.2*  < > 15.1*  < > 12.8* 7.7 8.9 9.2  NEUTROABS 8.3*  --  14.2*  --   --   --   --   --   HGB 9.0*  < > 11.2*  < > 12.1 10.0* 9.8* 11.6*  HCT 26.3*  < > 34.1*  < > 37.3 31.7* 31.5* 36  MCV 85.4  < > 91.4  < > 94.2 95.8 98.4  --   PLT 259  < > 308  < > 484* 391 333 316  < > = values in this interval not displayed. Lab Results  Component Value Date   TSH 0.781 11/02/2013   Lab Results  Component Value Date   HGBA1C 5.6 09/17/2013   Lab Results  Component Value Date   CHOL 219* 09/17/2013   HDL 74 09/17/2013   LDLCALC 122* 09/17/2013   TRIG 117 09/17/2013   CHOLHDL 3.0 09/17/2013    Significant Diagnostic Results in last 30 days:  Dg Chest 1 View  08/19/2015  CLINICAL DATA:  82 year old female with shortness of breath. Right hip fracture. EXAM: CHEST 1 VIEW COMPARISON:  Chest x-ray 08/17/2015. FINDINGS: Mild emphysematous changes are noted throughout the lungs bilaterally. No acute consolidative airspace disease. No pleural effusions. No evidence of pulmonary edema. Heart size is normal. New fullness in the right hilar region. Atherosclerosis in the thoracic aorta. IMPRESSION: 1. New fullness in the right hilar region. This is favored to be vascular, and could represent an acutely enlarged pulmonary artery (e.g., "Fleischner sign" associated with pulmonary embolism). Given the patient's history, clinical correlation for signs and symptoms of pulmonary embolism is suggested, with consideration for further evaluation with PE protocol CT scan if clinically appropriate. 2. Atherosclerosis. These results will be  called to the ordering clinician or representative by the Radiologist Assistant, and communication documented in the PACS or zVision Dashboard. Electronically Signed   By: Vinnie Langton M.D.   On: 08/19/2015 19:57   Ct Angio Chest Pe W/cm &/or Wo Cm  08/21/2015  CLINICAL DATA:  82 year old female with history of shortness of breath and clinical concern for potential pulmonary embolism. History of recent fall with right intertrochanteric hip fracture status post ORIF on 08/17/2015. EXAM: CT ANGIOGRAPHY CHEST WITH CONTRAST TECHNIQUE: Multidetector CT imaging of the chest was performed using the standard protocol during bolus administration of intravenous contrast. Multiplanar CT image reconstructions and MIPs were obtained to evaluate the vascular anatomy. CONTRAST:  100 mL of Isovue 370. COMPARISON:  Chest CT 11/10/2008. FINDINGS: Mediastinum/Lymph Nodes: Study is slightly limited by patient respiratory motion. Despite this limitation, there is no central, lobar or segmental sized filling defect within the pulmonary arteries to suggest clinically relevant pulmonary embolism. Smaller distal subsegmental sized pulmonary embolism cannot be entirely excluded. Heart size is borderline enlarged. Small amount of pericardial fluid and/or thickening, unlikely  to be of any hemodynamic significance at this time. No associated pericardial calcification. There is atherosclerosis of the thoracic aorta, the great vessels of the mediastinum and the coronary arteries, including calcified atherosclerotic plaque in the left main, left anterior descending and right coronary arteries. Mild calcifications of the aortic and mitral valve sparing No pathologically enlarged mediastinal or hilar lymph nodes. Esophagus is unremarkable in appearance. No axillary lymphadenopathy. Lungs/Pleura: Small bilateral pleural effusions lying dependently (right greater than left) with some associated passive subsegmental atelectasis in the lower  lobes of the lungs bilaterally. Importantly, the previously noted nodular areas of ground-glass attenuation and architectural distortion in the upper lobes the lungs bilaterally have increased in size compared to prior study 11/10/2008. The lesion in the left apex currently measures 2.9 x 1.5 cm (image 19 of series 7) previously 2.1 x 0.8 cm on 11/10/2008, and appears as a more dense area of ground-glass attenuation, septal thickening and demonstrates some internal bronchiolectasis, concerning potential adenocarcinoma. The lesion in the right apex which was previously a 1.3 cm pure ground-glass attenuation nodule currently measures 1.8 x 2.6 cm (image 25 of series 7) and has a central 8 mm solid component (image 17 of series 4), also concerning for potential adenocarcinoma. As well, there is a new peripheral ground-glass attenuation lesion measuring 2.8 x 1.5 cm in the right upper lobe (image 50 of series 7) which also has a central 12 mm solid component (image 34 of series 4), concerning for additional focus of disease. A few other scattered ill-defined areas of ground-glass attenuation are also noted. Mild diffuse bronchial wall thickening. Partial collapse of lower lobe bronchi during expiration, indicative of bronchomalacia. Upper Abdomen: Calcified granuloma in the periphery of segment 8 of the liver. Atherosclerosis. Musculoskeletal/Soft Tissues: There are no aggressive appearing lytic or blastic lesions noted in the visualized portions of the skeleton. Review of the MIP images confirms the above findings. IMPRESSION: 1. Despite the mild limitations of today's examination there is no evidence to suggest clinically relevant central, lobar or segmental sized pulmonary embolism. 2. However, there are multiple pulmonary lesions bilaterally which have the appearance concerning for probable multifocal primary bronchogenic adenocarcinoma. While one of these lesions in the right upper lobe is new, the other 2 lesions  in the apices of the upper lobes of the lungs bilaterally have clearly enlarged compared to remote prior study 11/10/2008. Further evaluation with nonemergent PET-CT is recommended in the near future for diagnostic and staging purposes. 3. Small bilateral pleural effusions (right greater than left) lying dependently with minimal passive subsegmental atelectasis in the lower lobes of the lungs bilaterally. 4. Evidence of bronchomalacia, as above. 5. Small amount of pericardial fluid and/or thickening, unlikely to be of hemodynamic significance at this time. 6. Atherosclerosis, including left main and 2 vessel coronary artery disease. These results will be called to the ordering clinician or representative by the Radiologist Assistant, and communication documented in the PACS or zVision Dashboard. Electronically Signed   By: Vinnie Langton M.D.   On: 08/21/2015 11:28   Ct Abdomen Pelvis W Contrast  09/06/2015  CLINICAL DATA:  Generalized abdominal pain with nausea and vomiting EXAM: CT ABDOMEN AND PELVIS WITH CONTRAST TECHNIQUE: Multidetector CT imaging of the abdomen and pelvis was performed using the standard protocol following bolus administration of intravenous contrast. CONTRAST:  116m ISOVUE-300 IOPAMIDOL (ISOVUE-300) INJECTION 61% COMPARISON:  Aug 22, 2015 FINDINGS: Lower chest: There is mild bibasilar atelectatic change. There is the pericardial effusion. There is atherosclerotic calcification in  the distal aorta. Hepatobiliary: No focal liver lesions are identified. There is focal fatty infiltration near the fissure for the ligamentum teres. Small gallstones are noted within the gallbladder. The gallbladder wall is not thickened. Mild intrahepatic and extrahepatic biliary duct dilatation is stable compared to recent prior study. No biliary duct mass or calculus is evident. Pancreas: There is no demonstrable pancreatic mass or inflammatory focus. Pancreas is rather atrophic, stable. Spleen: No splenic  lesions are evident. Adrenals/Urinary Tract: Adrenals appear unremarkable bilaterally. Areas of scarring in the left kidney are stable. Multiple foci of arterial vascular calcification is noted in each kidney. There is a cyst in the posterior lower pole right kidney measuring 7 x 7 mm. There is a 1 x 1 cm cyst in the lower pole left kidney. There is no hydronephrosis on either side. No renal or ureteral calculi are identified. Urinary bladder is midline with wall thickness within normal limits. Stomach/Bowel: There is dilatation of the stomach and proximal small bowel. There is a transition zone at the junction of the mid and distal portions of the jejunum consistent with a degree of small bowel obstruction. No free air or portal venous air. There is no appreciable bowel wall or mesenteric thickening. There are multiple sigmoid diverticula without diverticulitis. Vascular/Lymphatic: There is atherosclerotic calcification in the aorta and iliac arteries. Peripheral thrombus is noted in the distal aorta. There is no frank abdominal aortic aneurysm. Major mesenteric vessels appear patent. No adenopathy is apparent in the abdomen or pelvis. Reproductive: Uterus is anteverted. There is no pelvic mass. There is free fluid in the cul-de-sac region. Other: There is no abscess apparent in the abdomen or pelvis. There is no periappendiceal inflammation. Musculoskeletal: There is upper lumbar levoscoliosis. There is extensive arthropathy in the lumbar spine. There is vacuum phenomenon at L4-5 and L5-S1. There is evidence of a prior comminuted right proximal femur fracture with screw and plate fixation in this area. No acute fracture is evident. Bones are osteoporotic. There are no evident blastic or lytic bone lesions. There is no intramuscular or abdominal wall lesion. IMPRESSION: Small bowel obstruction with transition zone at the level of the mid to distal jejunum. No free air. Multiple sigmoid diverticula without  diverticulitis. Mild ascites in the dependent portion of the pelvis.  No abscess. Small gallstones within the gallbladder. Stable biliary duct dilatation without biliary duct mass or calculus seen. Atrophic pancreas without focal pancreatic lesion. Extensive atherosclerotic calcification. Prior right proximal femur fracture with postoperative change. Bones osteoporotic. Extensive arthropathy in the lumbar region, most severe at L4-5 and L5-S1. These results were called by telephone at the time of interpretation on 09/06/2015 at 7:33 pm to [facility] , who verbally acknowledged these results. Electronically Signed   By: Lowella Grip III M.D.   On: 09/06/2015 19:34   Ct Abdomen Pelvis W Contrast  08/22/2015  CLINICAL DATA:  82 year old with chest CT yesterday demonstrating enlarging semi-solid masses in both lungs worrisome for bronchogenic adenocarcinoma. Staging CT. EXAM: CT ABDOMEN AND PELVIS WITH CONTRAST TECHNIQUE: Multidetector CT imaging of the abdomen and pelvis was performed using the standard protocol following bolus administration of intravenous contrast. CONTRAST:  123m ISOVUE-300 IOPAMIDOL INJECTION 61% IV. Oral contrast was also administered. COMPARISON:  None. FINDINGS: Lower chest: Small bilateral pleural effusions, right greater than left, and associated passive atelectasis in the lower lobes. Very small pericardial effusion. Aortic annular and mitral annular calcification. Hepatobiliary: Liver normal in size and appearance. Small gallstones within an otherwise normal appearing  gallbladder. Mild intra and extrahepatic biliary ductal dilation, the common duct measuring up to approximately 12 mm diameter. No visible obstructing mass or stone within the duct. Pancreas: Atrophic, accounting for the mild pancreatic ductal dilation. No parenchymal mass or peripancreatic inflammation. Spleen: Normal in size and appearance. Adrenals/Urinary Tract: Normal appearing adrenal glands. Focal  scarring involving the upper pole and mid pole of the left kidney. Subcentimeter cortical cyst involving the mid left kidney. No solid mass involving either kidney. No hydronephrosis. No urinary tract calculi. Atherosclerotic arterial calcification within both kidneys mimic calculi. Stomach/Bowel: Small hiatal hernia. Stomach decompressed and otherwise unremarkable. Normal-appearing small bowel. Diffuse colonic diverticulosis without evidence of acute diverticulitis. Colon otherwise unremarkable. Appendix not clearly visualized. No ascites. Vascular/Lymphatic: Severe aorto-iliofemoral atherosclerosis without aneurysm. No pathologic lymphadenopathy. Reproductive: Uterus atrophic as expected for age. No adnexal masses. Other: Edema involving the subcutaneous tissues of the right lateral abdominal wall and right gluteal region. Musculoskeletal: Thoracolumbar levoscoliosis. Degenerative disc disease and spondylosis at every visualized lower thoracic and lumbar level with severe facet degenerative changes throughout the lumbar spine and severe multifactorial spinal stenosis at L4-5. IMPRESSION: 1. No evidence of metastatic disease involving the abdomen or pelvis. 2. No acute abnormalities involving the abdomen or pelvis. 3. Mild intra and extrahepatic biliary ductal dilation without visible obstructing stone or mass. 4. Small hiatal hernia. 5. Pancreatic atrophy. 6. Scarring involving the upper pole and mid pole of the left kidney. 7. 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 8. Skeletal findings as above, most significantly severe multifactorial spinal stenosis at the L4-5 level. 9. Small bilateral pleural effusions, right greater than left, with associated passive atelectasis involving the lower lobes. Electronically Signed   By: Evangeline Dakin M.D.   On: 08/22/2015 12:46   Dg Chest Port 1 View  09/06/2015  CLINICAL DATA:  Abdominal pain, weakness, possible surgery for small bowel obstruction EXAM:  PORTABLE CHEST 1 VIEW COMPARISON:  CT chest dated 08/21/2015 FINDINGS: Lungs are essentially clear. Recent ground-glass nodular opacities on CT are not radiographically evident. No pleural effusion or pneumothorax. The heart is normal in size. IMPRESSION: No evidence of acute cardiopulmonary disease. Recent ground-glass nodular opacities on CT are not radiographically evident. Electronically Signed   By: Julian Hy M.D.   On: 09/06/2015 21:54   Dg Abd 2 Views  09/08/2015  CLINICAL DATA:  Small bowel obstruction EXAM: ABDOMEN - 2 VIEW COMPARISON:  09/07/15 FINDINGS: Oral contrast is seen throughout the large bowel and into the rectum. There remain some mildly distended loops of small bowel. No free air is identified. There are air-fluid levels seen diffusely. IMPRESSION: Diffuse air-fluid levels could indicate ileus. Although there are a few mildly distended loops of small bowel, contrast is seen into the rectum. Electronically Signed   By: Skipper Cliche M.D.   On: 09/08/2015 09:55   Dg Abd Portable 1v-small Bowel Obstruction Protocol-initial, 8 Hr Delay  09/07/2015  CLINICAL DATA:  Small bowel obstruction EXAM: PORTABLE ABDOMEN - 1 VIEW COMPARISON:  Single view of the abdomen from earlier today FINDINGS: The patient i had s been given oral contrast in the interval. Contrast is seen within the colon after an 8 hour delay. Diverticuli are identified. Air-filled small bowel loops continue to improve with no convincing evidence of persistent obstruction. No other acute abnormalities. IMPRESSION: Contrast seen in the colon. No convincing evidence of remaining small bowel obstruction. Electronically Signed   By: Dorise Bullion III M.D   On: 09/07/2015 18:33   Dg Abd Portable  1v  09/07/2015  CLINICAL DATA:  Small bowel obstruction EXAM: PORTABLE ABDOMEN - 1 VIEW COMPARISON:  Yesterday FINDINGS: Dilated small bowel loops and nondilated gas-filled large bowel are scattered across the abdomen. Gas-filled  small bowel loops have become less prominent. Iodinated contrast fills the bladder. No obvious free intraperitoneal gas. Osteopenia. Degenerative changes in the hip joints and lumbar spine with levoscoliosis. IMPRESSION: Improved partial small bowel obstruction pattern. Electronically Signed   By: Marybelle Killings M.D.   On: 09/07/2015 07:27    Assessment/Plan #1 history small bowel obstruction again this appears to be resolved she is having regular bowel movements does not complain of abdominal discomfort-.  #2 hypokalemia this is been replaced updated metabolic panel on 28/36/6294 shows normalization at 3.6  #3 history of COPD with suspected lung carcinoma-clinically she appears to be stable on discharge she will need pulmonology follow-up.  She does continue onIncruse Ellipta-as well as duo nebs every 6 hours when necessary she is also on Spiriva daily.  #4-history of back pain-I note she is on Robaxin thousand milligrams every 6 hours as well as Percocet 5-3 25 mg every 8 hours when necessary- We have added topical Biofreeze as well this appears to be helping-she.  #5-history of peripherovascular disease she continues on Plendil.  #6-coronary artery disease this appears stable at this point she does have Nitrostat as needed has not complained of chest pain. She is not on anticoagulation appear secondary to history of GI bleed.  #7-history of anxiety this appears stable she is on BuSpar we did recently increase this.  #8 history of depression this appears stable on Celexa 20 mg daily.  #9 history hypothyroidism this is being supplemented she is on Synthroid.   #10 history of right foot edema this I suspect is dependent related  #11 history of right hip fracture this was surgically repair this appears to be stable she is working with rehabilitation pain issues as noted above-currently she appears stable in this regards-she did complete a short course of Eliquist for DVT prophylaxis-she will  need continued PT and OT upon discharge also will need assistance with her ADLs with a nursing aide-and skilled nursing to follow up as well of this issue as well as her multiple medical issues  Of note will have home health draw CBC and BMP next week notify primary care provider of results to keep an eye on her electrolytes as well as hemoglobin this appears to be stable with most recent hemoglobin 11.6.  [id number] note greater than 30 minutes spent on this discharge summary-greater than 50% of time spent coordinating plan of care--including reviewing and writing prescriptions    [id number]-of note greater than 40 minutes spent assessing patient-reviewing her chart-her labs-discussing her status at bedside with patient-as well as with nursing-and coordinating and formulating a plan of care for numerous diagnoses-of note greater than 50% of time spent coordinating plan of care         Granville Lewis, PA-C [phone]

## 2015-09-23 ENCOUNTER — Inpatient Hospital Stay: Payer: Self-pay | Admitting: Adult Health

## 2015-10-31 ENCOUNTER — Institutional Professional Consult (permissible substitution): Payer: Self-pay | Admitting: Internal Medicine

## 2015-11-01 ENCOUNTER — Institutional Professional Consult (permissible substitution): Payer: Self-pay | Admitting: Internal Medicine

## 2015-11-29 ENCOUNTER — Institutional Professional Consult (permissible substitution): Payer: Self-pay | Admitting: Internal Medicine

## 2016-02-14 ENCOUNTER — Encounter: Payer: Self-pay | Admitting: Internal Medicine

## 2016-02-14 ENCOUNTER — Ambulatory Visit (INDEPENDENT_AMBULATORY_CARE_PROVIDER_SITE_OTHER): Payer: Medicare Other | Admitting: Internal Medicine

## 2016-02-14 VITALS — BP 134/68 | HR 91 | Ht 64.5 in | Wt 101.0 lb

## 2016-02-14 DIAGNOSIS — J449 Chronic obstructive pulmonary disease, unspecified: Secondary | ICD-10-CM

## 2016-02-14 NOTE — Progress Notes (Signed)
Subjective:     Patient ID: [name], female   DOB: 10/15/1930,     MRN: [id number]  HPI  66 yowf quit smoking in 1980 still healthy with ex = biking stationery up to 10-15 ? Resistance slow pace referred to pulmonary clinic 02/14/2016 by Dr  Theodis Aguas for copd eval   02/14/2016 1st [facility] office visit/ Brandye Inthavong   Chief Complaint  Patient presents with  . Pulmonary Consult    Referred by Dr. Thurston Pounds. Pt states unsure why she was referred. She states she had a cold "ages ago". She denies any respiratory co's.    no am cough or congestion / currently on multiple inhalers/ nebs and confused when how to take them  Not limited by breathing from desired activities  / very sedentary   No obvious day to day or daytime variability or assoc excess/ purulent sputum or mucus plugs or hemoptysis or cp or chest tightness, subjective wheeze or overt sinus or hb symptoms. No unusual exp hx or h/o childhood pna/ asthma or knowledge of premature birth.  Sleeping ok without nocturnal  or early am exacerbation  of respiratory  c/o's or need for noct saba. Also denies any obvious fluctuation of symptoms with weather or environmental changes or other aggravating or alleviating factors except as outlined above   Current Medications, Allergies, Complete Past Medical History, Past Surgical History, Family History, and Social History were reviewed in [employer] record.  ROS  The following are not active complaints unless bolded sore throat, dysphagia, dental problems, itching, sneezing,  nasal congestion or excess/ purulent secretions, ear ache,   fever, chills, sweats, unintended wt loss, classically pleuritic or exertional cp,  orthopnea pnd or leg swelling, presyncope, palpitations, abdominal pain, anorexia, nausea, vomiting, diarrhea  or change in bowel or bladder habits, change in stools or urine, dysuria,hematuria,  rash, arthralgias, visual complaints, headache,  numbness, weakness or ataxia or problems with walking or coordination,  change in mood/affect or memory.             Review of Systems     Objective:   Physical Exam    thin amb wf nad  Wt Readings from Last 3 Encounters:  02/14/16 101 lb (45.8 kg)  09/06/15 102 lb 1.2 oz (46.3 kg)  08/29/15 101 lb 12.8 oz (46.2 kg)    Vital signs reviewed - Note on arrival 02 sats  95% on RA     HEENT mild turbinate edema.  Oropharynx no thrush or excess pnd or cobblestoning.  No JVD or cervical adenopathy. Mild accessory muscle hypertrophy. Trachea midline, nl thryroid. Chest was hyperinflated by percussion with diminished breath sounds and moderate increased exp time without wheeze. Hoover sign positive at mid inspiration. Regular rate and rhythm without murmur gallop or rub or increase P2 or edema.  Abd: no hsm, nl excursion. Ext warm without cyanosis or clubbing.      Assessment:

## 2016-02-14 NOTE — Patient Instructions (Addendum)
Work on inhaler technique:  relax and gently blow all the way out then take a nice smooth deep breath back in, triggering the inhaler at same time you start breathing in.  Hold for up to 5 seconds if you can. Blow out thru nose. Rinse and gargle with water when done      Plan A = Automatic +=Ok to use dulera 100 up 2 pffs every 12 hours and stop the spiriva (it's like octane fuel)    Plan B = Backup  Only use your duoneb as a rescue medication to be used if you can't catch your breath by resting or doing a relaxed purse lip breathing pattern.  - The less you use it, the better it will work when you need it. - Ok to use up to  every 4 hours if you must but call for immediate appointment if use goes up over your usual need - Don't leave home without it !!  (think of it like the spare tire for your car)   Pulmonary follow up is as needed

## 2016-02-15 ENCOUNTER — Encounter: Payer: Self-pay | Admitting: Internal Medicine

## 2016-02-15 NOTE — Assessment & Plan Note (Signed)
Spirometry  11/16/08  FEV1 0.82 (44%)  Ratio 51   - Spirometry 02/14/2016  FEV1 0.99 (52%)  Ratio 52  02/14/2016  After extensive coaching HFA effectiveness =    75% > continue dulera 100 2bid prn     I reviewed the Fletcher curve with the patient that basically indicates  if you quit smoking when your best day FEV1 is still well preserved (as is still relatively  the case here)  it is highly unlikely you will progress to severe disease and informed the patient there was  no medication on the market that has proven to alter the curve/ its downward trajectory  or the likelihood of progression of their disease(unlike other chronic medical conditions such as atheroclerosis where we do think we can change the natural hx with risk reducing meds)    Therefore  maintaining abstinence is the most important aspect of care, not choice of inhalers or for that matter, doctors.    So at this pont  The treatment is all based on symptom control  And probably would do just as well with a simplified rx = dulera 100 2bid and reserve the duoneb for as needed purposes so if she starts needing it for flares or to control symptoms (whch she assures me is not the case now) we can see her back and consider additonal options like adding back a lama or changing to lama/laba for more doe or just adding the lama to the laba/ics if having more flares.   Total time devoted to counseling  = 35/49mreview case with pt/ discussion of options/alternatives/ personally creating written instructions  in presence of pt  then going over those specific  Instructions directly with the pt including how to use all of the meds but in particular covering each new medication in detail and the difference between the maintenance/automatic meds and the prns using an action plan format for the latter.

## 2016-02-23 ENCOUNTER — Telehealth: Payer: Self-pay | Admitting: Internal Medicine

## 2016-02-23 NOTE — Telephone Encounter (Signed)
Called and spoke with Jackelyn Poling and she would like to see if MW would review and address her CT scan.  Jackelyn Poling stated that she has seen the pt in crisis in the last 6 weeks.  She stated that when the referral was sent to MW to see the pt it was for her COPD but it was also for her CT resutls of the bronchogenic lesion in the cta back in 08/2015.  MW please advise. Jackelyn Poling would like a call back to let her know what is going on.  thanks

## 2016-02-24 NOTE — Telephone Encounter (Signed)
lmtcb x1 for pt. 

## 2016-02-24 NOTE — Telephone Encounter (Signed)
Sorry we didn't address that but I did look at The last cxr she had, which was done  after the CT,  And it was nl  As was an abd ct which showed part of the lung but not all so   it is possible there are some microscopic changes only viz on  The full Chest CT and it's been 6 months since the last one so rec repeat CT s contrast now to f/u multiple nodules and I will call with  recs

## 2016-02-27 NOTE — Telephone Encounter (Signed)
Attempted to call Debbie back but the office recording stated that they are having phone difficulties.  Will need to try back later.

## 2016-02-29 NOTE — Telephone Encounter (Signed)
lmtcb for [employer].

## 2016-03-05 NOTE — Telephone Encounter (Signed)
Attempted to contact [facility] again x 3.  Will sign off at this time.

## 2016-03-16 ENCOUNTER — Telehealth: Payer: Self-pay | Admitting: Internal Medicine

## 2016-03-16 ENCOUNTER — Other Ambulatory Visit: Payer: Self-pay

## 2016-03-16 DIAGNOSIS — R911 Solitary pulmonary nodule: Secondary | ICD-10-CM

## 2016-03-16 NOTE — Telephone Encounter (Signed)
Was able to speak with Jackelyn Poling and have her MW message. She stated she would make a note of this. I have also spoke with the [name]. And gave her MW recc. [name]. Agreed to the repeat scan. The order has been placed nothing further is needed at this time.  Tanda Rockers, MD      5:52 AM  Note    Sorry we didn't address that but I did look at The last cxr she had, which was done  after the CT,  And it was nl  As was an abd ct which showed part of the lung but not all so   it is possible there are some microscopic changes only viz on  The full Chest CT and it's been 6 months since the last one so rec repeat CT s contrast now to f/u multiple nodules and I will call with  recs

## 2016-03-19 ENCOUNTER — Other Ambulatory Visit: Payer: Self-pay

## 2016-03-19 ENCOUNTER — Encounter: Payer: Self-pay | Admitting: Internal Medicine

## 2016-03-19 ENCOUNTER — Telehealth: Payer: Self-pay | Admitting: Internal Medicine

## 2016-03-19 DIAGNOSIS — R911 Solitary pulmonary nodule: Secondary | ICD-10-CM

## 2016-03-19 NOTE — Telephone Encounter (Signed)
ATC, NA and no option to leave msg, WCB 12/12

## 2016-03-19 NOTE — Telephone Encounter (Signed)
Spoke with pt, who states she would like to cancel her CT that is scheduled for 03/13/16. Pt reports that "she is all tested out".   Will route to MW for a FYI.

## 2016-03-19 NOTE — Telephone Encounter (Signed)
rec ov 06/18/15 with cxr only - if declines this then let her PC know by cc'ing this phone note

## 2016-03-20 NOTE — Telephone Encounter (Signed)
atc pt X2, no answer, no vm set up.  Wcb.  

## 2016-03-21 NOTE — Telephone Encounter (Signed)
atc-no vm set up  Wcb

## 2016-03-22 ENCOUNTER — Other Ambulatory Visit: Payer: Self-pay

## 2016-03-23 ENCOUNTER — Other Ambulatory Visit: Payer: Self-pay

## 2016-09-25 ENCOUNTER — Ambulatory Visit: Payer: Self-pay | Admitting: Internal Medicine

## 2017-02-07 DEATH — deceased

## 2017-10-02 IMAGING — CT CT ABD-PELV W/ CM
2 of 5 series · 16 of 46 positions shown, 18 images · IV contrast (iopamidol)
Comparison: None.

CLINICAL DATA: 85-year-old with chest CT yesterday demonstrating
enlarging semi-solid masses in both lungs worrisome for bronchogenic
adenocarcinoma. Staging CT.

EXAM:
CT ABDOMEN AND PELVIS WITH CONTRAST
TECHNIQUE: Multidetector CT imaging of the abdomen and pelvis was performed
using the standard protocol following bolus administration of
intravenous contrast.
CONTRAST:  100mL [id number] IOPAMIDOL INJECTION 61% IV. Oral
contrast was also administered.

[Series 2: rtn a/p with · axial · 0.70mm/px · z∈[-418,-58]mm · 13 of 84 slices shown, 15 images]
[im 6/84  soft-tissue]
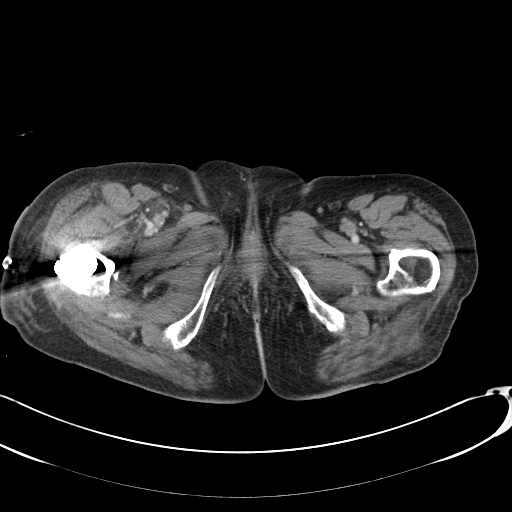
[im 6/84  bone]
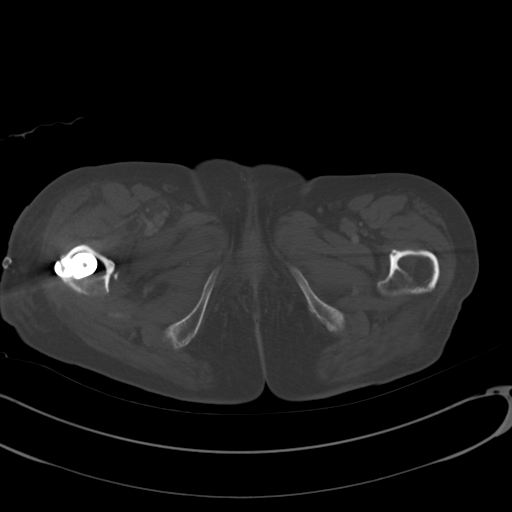
[im 11/84  soft-tissue]
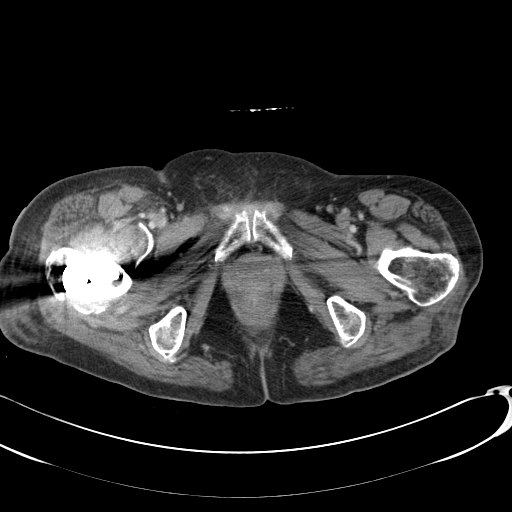
[im 16/84  soft-tissue]
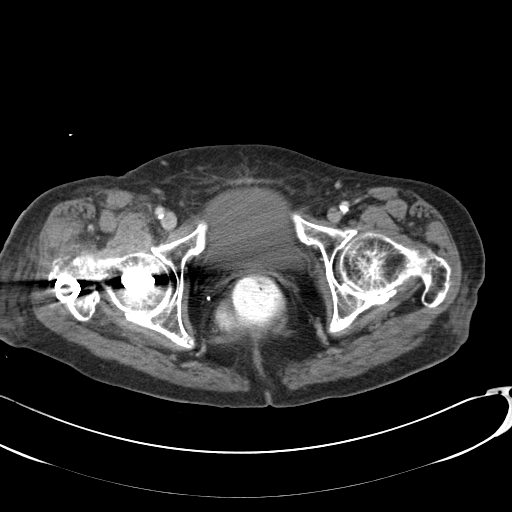
[im 26/84  soft-tissue]
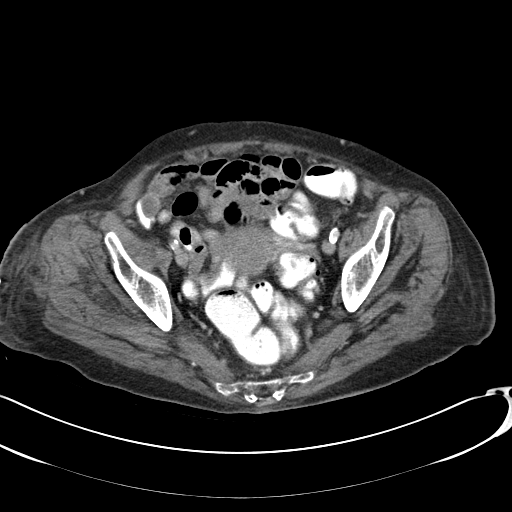
[im 32/84  soft-tissue]
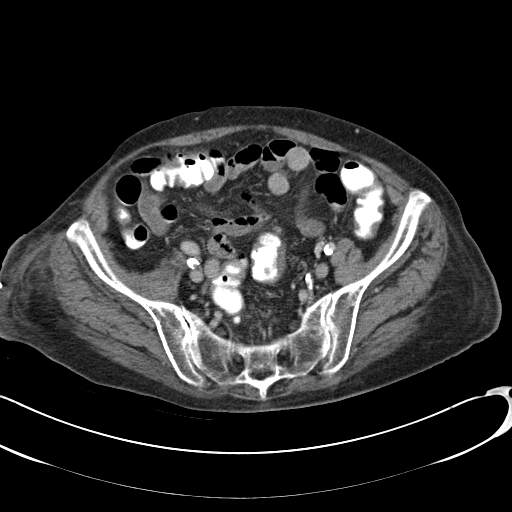
[im 37/84  soft-tissue]
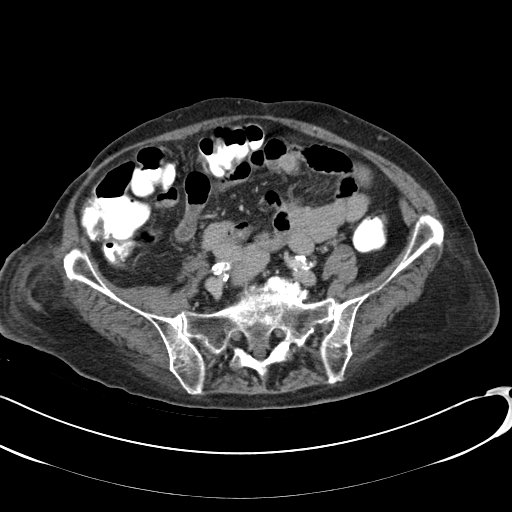
[im 42/84  soft-tissue]
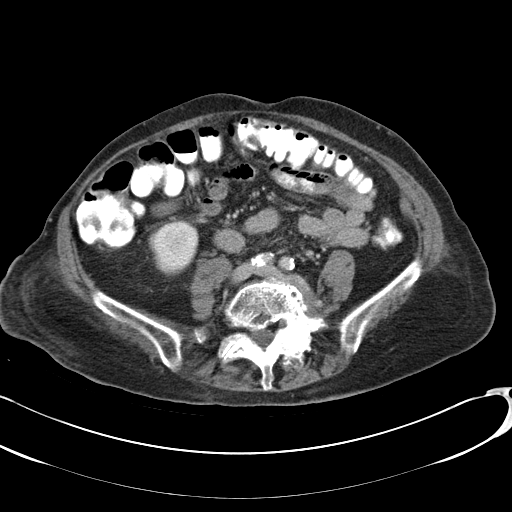
[im 47/84  soft-tissue]
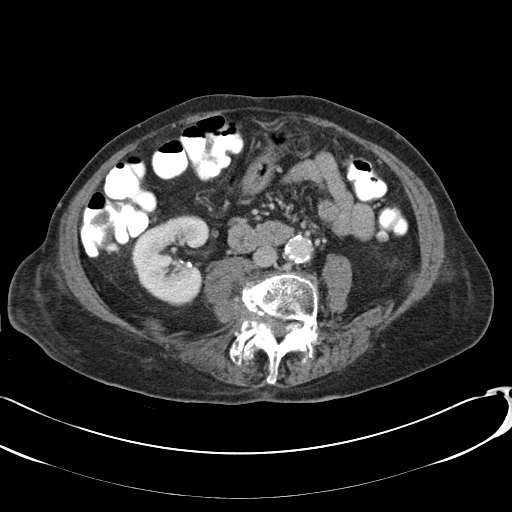
[im 52/84  soft-tissue]
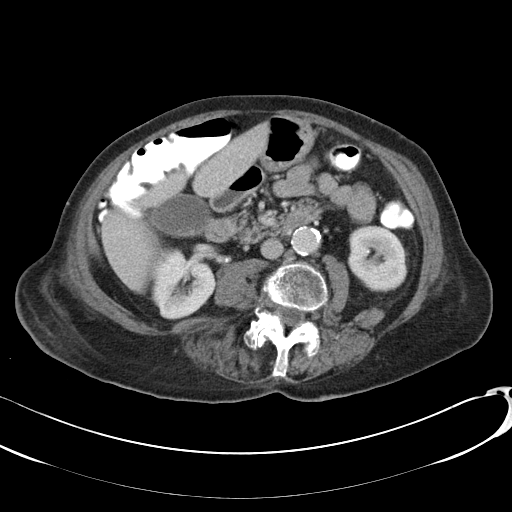
[im 52/84  bone]
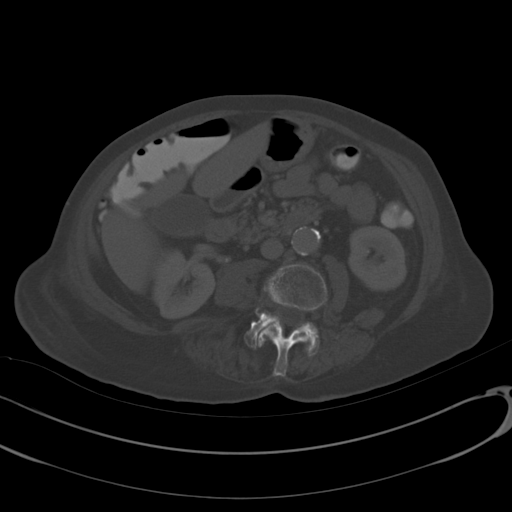
[im 58/84  soft-tissue]
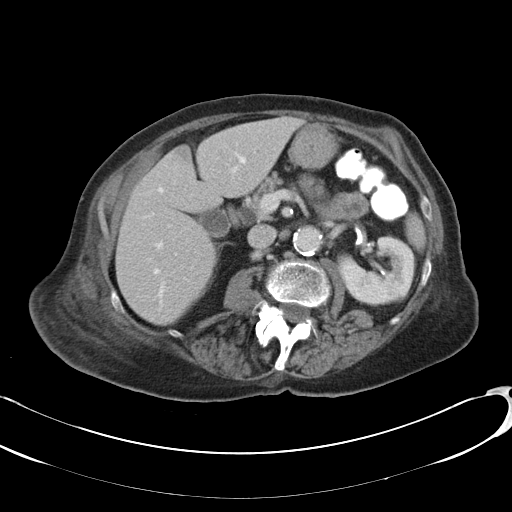
[im 68/84  soft-tissue]
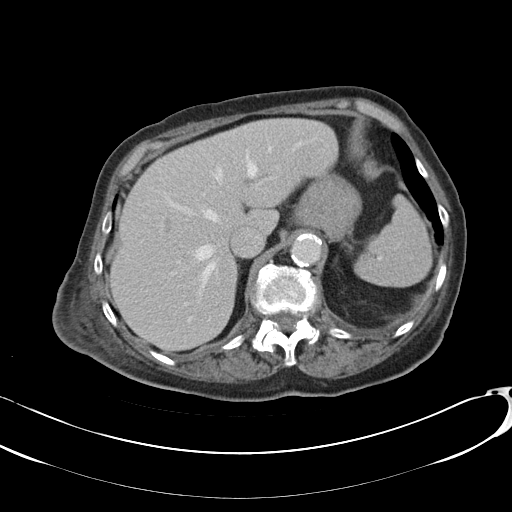
[im 73/84  soft-tissue]
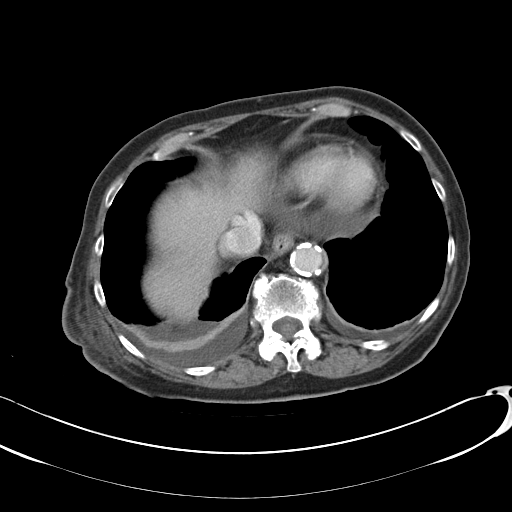
[im 78/84  soft-tissue]
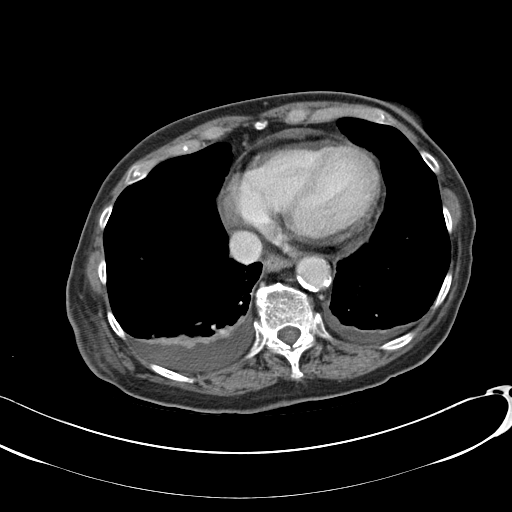

[Series 602: coronal images · coronal · 0.82mm/px · 3 of 113 slices shown]
[im 38/113  soft-tissue]
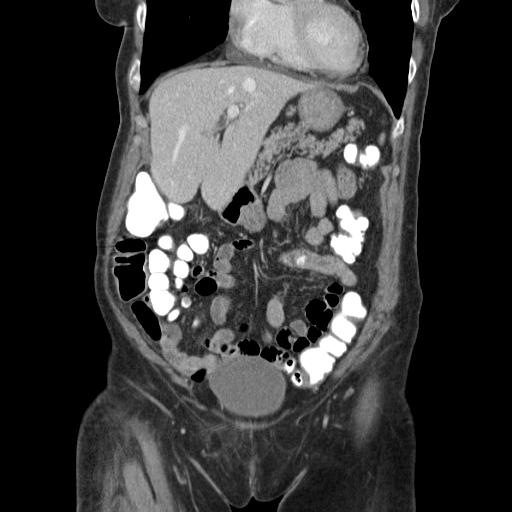
[im 50/113  soft-tissue]
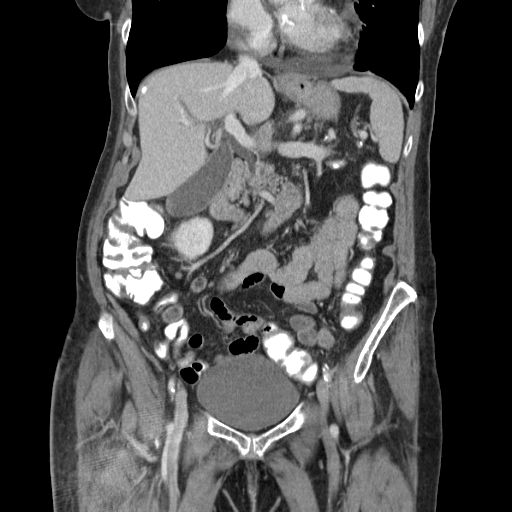
[im 63/113  soft-tissue]
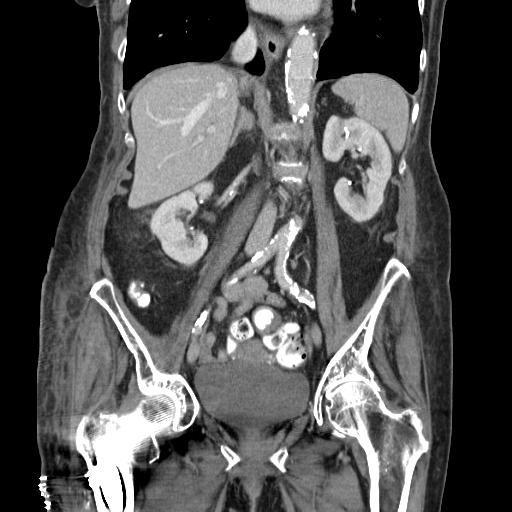

[16 of 46 positions shown; findings below may reference images not displayed]

FINDINGS: Lower chest: Small bilateral pleural effusions, right greater than
left, and associated passive atelectasis in the lower lobes. Very
small pericardial effusion. Aortic annular and mitral annular
calcification.

Hepatobiliary: Liver normal in size and appearance. Small gallstones
within an otherwise normal appearing gallbladder. Mild intra and
extrahepatic biliary ductal dilation, the common duct measuring up
to approximately 12 mm diameter. No visible obstructing mass or
stone within the duct.

Pancreas: Atrophic, accounting for the mild pancreatic ductal
dilation. No parenchymal mass or peripancreatic inflammation.

Spleen: Normal in size and appearance.

Adrenals/Urinary Tract: Normal appearing adrenal glands. Focal
scarring involving the upper pole and mid pole of the left kidney.
Subcentimeter cortical cyst involving the mid left kidney. No solid
mass involving either kidney. No hydronephrosis. No urinary tract
calculi. Atherosclerotic arterial calcification within both kidneys
mimic calculi.

Stomach/Bowel: Small hiatal hernia. Stomach decompressed and
otherwise unremarkable. Normal-appearing small bowel. Diffuse
colonic diverticulosis without evidence of acute diverticulitis.
Colon otherwise unremarkable. Appendix not clearly visualized. No
ascites.

Vascular/Lymphatic: Severe aorto-iliofemoral atherosclerosis without
aneurysm. No pathologic lymphadenopathy.

Reproductive: Uterus atrophic as expected for age. No adnexal
masses.

Other: Edema involving the subcutaneous tissues of the right lateral
abdominal wall and right gluteal region.

Musculoskeletal: Thoracolumbar levoscoliosis. Degenerative disc
disease and spondylosis at every visualized lower thoracic and
lumbar level with severe facet degenerative changes throughout the
lumbar spine and severe multifactorial spinal stenosis at L4-5.
IMPRESSION: 1. No evidence of metastatic disease involving the abdomen or
pelvis.
2. No acute abnormalities involving the abdomen or pelvis.
3. Mild intra and extrahepatic biliary ductal dilation without
visible obstructing stone or mass.
4. Small hiatal hernia.
5. Pancreatic atrophy.
6. Scarring involving the upper pole and mid pole of the left
kidney.
7. Severe aortoiliofemoral atherosclerosis without aneurysm.
8. Skeletal findings as above, most significantly severe
multifactorial spinal stenosis at the L4-5 level.
9. Small bilateral pleural effusions, right greater than left, with
associated passive atelectasis involving the lower lobes.
